# Patient Record
Sex: Male | Born: 1935
Health system: Southern US, Community
[De-identification: ages and names within clinical notes are randomized; demographics above are authoritative.]

## PROBLEM LIST (undated history)

## (undated) DIAGNOSIS — C44211 Basal cell carcinoma of skin of unspecified ear and external auricular canal: Secondary | ICD-10-CM

## (undated) DIAGNOSIS — S82209A Unspecified fracture of shaft of unspecified tibia, initial encounter for closed fracture: Secondary | ICD-10-CM

## (undated) DIAGNOSIS — C060 Malignant neoplasm of cheek mucosa: Secondary | ICD-10-CM

## (undated) DIAGNOSIS — S5291XA Unspecified fracture of right forearm, initial encounter for closed fracture: Secondary | ICD-10-CM

## (undated) DIAGNOSIS — Z87891 Personal history of nicotine dependence: Secondary | ICD-10-CM

## (undated) DIAGNOSIS — S62309A Unspecified fracture of unspecified metacarpal bone, initial encounter for closed fracture: Secondary | ICD-10-CM

## (undated) DIAGNOSIS — M1990 Unspecified osteoarthritis, unspecified site: Secondary | ICD-10-CM

## (undated) HISTORY — DX: Basal cell carcinoma of skin of unspecified ear and external auricular canal: C44.211

## (undated) HISTORY — DX: Unspecified fracture of unspecified metacarpal bone, initial encounter for closed fracture: S62.309A

## (undated) HISTORY — PX: CATARACT EXTRACTION: SUR2

## (undated) HISTORY — PX: PARTIAL GLOSSECTOMY: SHX2173

## (undated) HISTORY — PX: INGUINAL HERNIA REPAIR: SUR1180

## (undated) HISTORY — DX: Malignant neoplasm of cheek mucosa: C06.0

## (undated) HISTORY — DX: Unspecified fracture of shaft of unspecified tibia, initial encounter for closed fracture: S82.209A

## (undated) HISTORY — DX: Personal history of nicotine dependence: Z87.891

## (undated) HISTORY — DX: Unspecified fracture of right forearm, initial encounter for closed fracture: S52.91XA

---
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
## 1962-10-12 DIAGNOSIS — S82209A Unspecified fracture of shaft of unspecified tibia, initial encounter for closed fracture: Secondary | ICD-10-CM

## 1962-10-12 HISTORY — DX: Unspecified fracture of shaft of unspecified tibia, initial encounter for closed fracture: S82.209A

## 1992-10-12 HISTORY — PX: INGUINAL HERNIA REPAIR: SUR1180

## 1995-10-13 HISTORY — PX: APPENDECTOMY: SHX54

## 2001-07-25 ENCOUNTER — Encounter: Admission: RE | Admit: 2001-07-25 | Discharge: 2001-07-25 | Payer: Self-pay | Admitting: Urology

## 2001-07-25 ENCOUNTER — Encounter: Payer: Self-pay | Admitting: Urology

## 2001-09-14 ENCOUNTER — Encounter: Admission: RE | Admit: 2001-09-14 | Discharge: 2001-09-14 | Payer: Self-pay | Admitting: Urology

## 2001-09-14 ENCOUNTER — Encounter: Payer: Self-pay | Admitting: Urology

## 2001-09-23 ENCOUNTER — Encounter: Payer: Self-pay | Admitting: Urology

## 2001-09-23 ENCOUNTER — Encounter: Admission: RE | Admit: 2001-09-23 | Discharge: 2001-09-23 | Payer: Self-pay | Admitting: Urology

## 2001-10-18 ENCOUNTER — Encounter: Payer: Self-pay | Admitting: Urology

## 2001-10-18 ENCOUNTER — Encounter: Admission: RE | Admit: 2001-10-18 | Discharge: 2001-10-18 | Payer: Self-pay | Admitting: Urology

## 2001-11-03 ENCOUNTER — Encounter (INDEPENDENT_AMBULATORY_CARE_PROVIDER_SITE_OTHER): Payer: Self-pay | Admitting: Specialist

## 2001-11-03 ENCOUNTER — Ambulatory Visit (HOSPITAL_BASED_OUTPATIENT_CLINIC_OR_DEPARTMENT_OTHER): Admission: RE | Admit: 2001-11-03 | Discharge: 2001-11-03 | Payer: Self-pay | Admitting: Urology

## 2001-11-11 ENCOUNTER — Emergency Department (HOSPITAL_COMMUNITY): Admission: EM | Admit: 2001-11-11 | Discharge: 2001-11-11 | Payer: Self-pay | Admitting: Emergency Medicine

## 2002-09-02 ENCOUNTER — Inpatient Hospital Stay (HOSPITAL_COMMUNITY): Admission: EM | Admit: 2002-09-02 | Discharge: 2002-09-03 | Payer: Self-pay | Admitting: Emergency Medicine

## 2002-09-02 ENCOUNTER — Encounter (INDEPENDENT_AMBULATORY_CARE_PROVIDER_SITE_OTHER): Payer: Self-pay | Admitting: Specialist

## 2002-10-12 DIAGNOSIS — C060 Malignant neoplasm of cheek mucosa: Secondary | ICD-10-CM

## 2002-10-12 HISTORY — DX: Malignant neoplasm of cheek mucosa: C06.0

## 2004-05-02 ENCOUNTER — Encounter (INDEPENDENT_AMBULATORY_CARE_PROVIDER_SITE_OTHER): Payer: Self-pay | Admitting: Specialist

## 2004-05-02 ENCOUNTER — Inpatient Hospital Stay (HOSPITAL_COMMUNITY): Admission: RE | Admit: 2004-05-02 | Discharge: 2004-05-04 | Payer: Self-pay | Admitting: Otolaryngology

## 2004-11-06 ENCOUNTER — Encounter: Admission: RE | Admit: 2004-11-06 | Discharge: 2004-11-06 | Payer: Self-pay | Admitting: Otolaryngology

## 2005-11-19 ENCOUNTER — Emergency Department (HOSPITAL_COMMUNITY): Admission: EM | Admit: 2005-11-19 | Discharge: 2005-11-19 | Payer: Self-pay | Admitting: Emergency Medicine

## 2006-03-29 ENCOUNTER — Emergency Department (HOSPITAL_COMMUNITY): Admission: EM | Admit: 2006-03-29 | Discharge: 2006-03-30 | Payer: Self-pay | Admitting: Emergency Medicine

## 2006-11-25 ENCOUNTER — Encounter: Admission: RE | Admit: 2006-11-25 | Discharge: 2006-11-25 | Payer: Self-pay | Admitting: Otolaryngology

## 2007-10-17 ENCOUNTER — Encounter: Admission: RE | Admit: 2007-10-17 | Discharge: 2007-10-17 | Payer: Self-pay | Admitting: Otolaryngology

## 2007-11-15 ENCOUNTER — Encounter: Admission: RE | Admit: 2007-11-15 | Discharge: 2007-11-15 | Payer: Self-pay | Admitting: Otolaryngology

## 2008-11-30 ENCOUNTER — Encounter: Admission: RE | Admit: 2008-11-30 | Discharge: 2008-11-30 | Payer: Self-pay | Admitting: Otolaryngology

## 2010-05-16 HISTORY — PX: CHOLECYSTECTOMY: SHX55

## 2011-02-07 ENCOUNTER — Emergency Department (HOSPITAL_COMMUNITY)
Admission: EM | Admit: 2011-02-07 | Discharge: 2011-02-08 | Disposition: A | Discharge status: Home / Self Care | Payer: Medicare Other | Attending: Emergency Medicine | Admitting: Emergency Medicine

## 2011-02-07 DIAGNOSIS — R110 Nausea: Secondary | ICD-10-CM | POA: Insufficient documentation

## 2011-02-07 DIAGNOSIS — IMO0002 Reserved for concepts with insufficient information to code with codable children: Secondary | ICD-10-CM | POA: Insufficient documentation

## 2011-02-07 LAB — BASIC METABOLIC PANEL
CO2: 27 mEq/L (ref 19–32)
Chloride: 104 mEq/L (ref 96–112)
Creatinine, Ser: 1.04 mg/dL (ref 0.4–1.5)
GFR calc non Af Amer: >60 mL/min (ref >60)
Sodium: 139 mEq/L (ref 135–145)

## 2011-02-07 LAB — DIFFERENTIAL
Basophils Absolute: 0 10*3/uL (ref 0.0–0.1)
Basophils Relative: 0 % (ref 0–1)
Eosinophils Absolute: 0.2 10*3/uL (ref 0.0–0.7)
Eosinophils Relative: 2 % (ref 0–5)
Lymphs Abs: 1.7 10*3/uL (ref 0.7–4.0)
Neutrophils Relative %: 67 % (ref 43–77)

## 2011-02-07 LAB — CBC
Hemoglobin: 13.9 g/dL (ref 13.0–17.0)
MCV: 87.9 fL (ref 78.0–100.0)
RDW: 12.5 % (ref 11.5–15.5)
WBC: 7.8 10*3/uL (ref 4.0–10.5)

## 2011-02-27 NOTE — Op Note (Signed)
NAME:  Justin Mcgee, Justin Mcgee NO.:  [id number]   MEDICAL RECORD NO.:  [id number]                   PATIENT TYPE:  INP   LOCATION:  0101                                 FACILITY:  [facility]   PHYSICIAN:  Lorre Munroe., M.D.            DATE OF BIRTH:  1935/08/09   DATE OF PROCEDURE:  09/02/2002  DATE OF DISCHARGE:                                 OPERATIVE REPORT   PREOPERATIVE DIAGNOSES:  Acute appendicitis.   POSTOPERATIVE DIAGNOSIS:  Acute appendicitis.   OPERATION:  Laparoscopic appendectomy.   SURGEON:  Lebron Conners, M.D.   ANESTHESIA:  General.   PROCEDURE:  After the patient was monitored and anesthetized and had routine  preparation and draping of the abdomen, in the anesthetized area just below  the umbilicus, we made a short transverse incision there.  Incised the  fascia longitudinally in the midline and entered the peritoneum bluntly.  I  put in my finger to assure that there were no adhesions in the area.  I put  a pursestring 0 Vicryl suture in the fascia and secured a Hasson cannula and  then inflated the abdomen with CO2.  I put in the scope and saw that there  was a little bit of cloudy fluid in the right lower quadrant, and after  moving the bowel a bit, we were able to determine that the patient had acute  appendicitis.  I then anesthetized the port sites in the lower midline in  the right upper quadrant and put in a 5 mm right upper quadrant port and a 2  mm lower midline port.  Then, I dissected the fat away and mesoappendix  after elevating the appendix with a large grasper.  I clipped the  appendiceal artery with three clips and divided between the two most distal  and then stapled the appendix with linear cutting endoscopic stapler just  distal to its emergence from the cecum.  It appeared healthy at that point.  I then placed the appendix in a plastic pouch and removed it through the  umbilical incision and tied the pursestring  suture in the umbilical  incision.  I carefully inspected the area for hemostasis and found it was  good.  I saw no other intra-abdominal abnormalities.  I then removed the  right upper quadrant port under direct vision, and there was no bleeding  from the abdominal wall.  I removed the lower midline port after allowing  the CO2 to escape.  I closed all skin incisions with intracuticular 4-0  Vicryl and Steri-Strips.  The patient was stable through the procedure.                                               Lorre Munroe., M.D.    WB/MEDQ  D:  09/02/2002  T:  09/03/2002  Job:  [id number]   cc:   Brett Canales A. Cleta Alberts, M.D.  [address]  [place]  [place] [address]  Fax: [phone]

## 2011-02-27 NOTE — Op Note (Signed)
[facility]  Patient:    Justin Mcgee, Justin Mcgee Visit Number: [id number] MRN: [id number]          Service Type: NES Location: NESC Attending Physician:  Liborio Nixon Dictated by:   Bertram Millard. Dahlstedt, M.D. Proc. Date: 11/03/01 Admit Date:  11/03/2001   CC:         Jamesetta Geralds, M.D.   Operative Report  PREOPERATIVE DIAGNOSIS:  Left inguinal adenopathy.  POSTOPERATIVE DIAGNOSIS:  Left inguinal adenopathy.  PROCEDURE:  Left inguinal lymph node dissection.  SURGEON:  Bertram Millard. Dahlstedt, M.D.  ANESTHESIA:  General with LMA.  COMPLICATIONS:  None.  BRIEF HISTORY:  This 76 year old gentleman has presented to my attention in the past with hematospermia.  This has been treated both conservatively and aggressively with Proscar.  He had persistent hematospermia despite aggressive treatment and requested further evaluation.  CT scan of the abdomen and pelvis was done, which revealed normal intra-abdominal findings but left groin adenopathy.  This was followed up three months later with a repeat CT scan, which showed possible increased size of his lymph nodes in the left inguinal region.  I was unable to palpate these, but the patient is mildly overweight. I spoke with a medical oncologist, who recommended CT of the chest.  This was performed and showed no adenopathy.  At this point it was recommended to undergo lymph node dissection in his left groin.  Risks and complications have been discussed with this patient, who desires to proceed.  DESCRIPTION OF PROCEDURE:  The patient was administered a general anesthetic using the LMA.  He was placed into the supine position.  Genitalia and perineum were prepped and draped.  His groin was carefully palpated without obvious lymphadenopathy.  An incision was made in a vertical direction just under his left inguinal ligament.  The saphenous vein was encountered and was quite large secondary to  varicosities.  Dissection was performed on either side of the saphenous vein in the lymphatic tissue.  Careful dissection was performed, and small vessels and lymphatics were clipped with ligaclips.  Two packets of tissue on both sides of the saphenous vein were then carefully dissected out and sent for permanent section fresh.  There was no obvious adenopathy on careful dissection within the inguinal region in an area corresponding to the CT scan.  It was felt that perhaps the appearance of adenopathy was due to varicosities.  Following removal of both lymphatic packets, the beds of dissection were carefully inspected and found to be hemostatic.  At this point the incision was infiltrated with 10 cc of 0.5% plain Marcaine.  Subcutaneous sutures were taken using 2-0 Vicryl.  Skin edges were reapproximated using running subcuticular suture of 4-0 Vicryl. Steri-Strips, Telfa, and a Tegaderm dressing were placed.  The patient tolerated the procedure well.  Estimated blood loss was perhaps 25 cc.  He was awakened, extubated, and taken to the PACU in stable condition.  He was discharged home on Vicodin tablets, #15, one p.o. q.4h.  He will follow up in two weeks.  He was given activity restrictions. Dictated by:   Bertram Millard. Dahlstedt, M.D. Attending Physician:  Liborio Nixon DD:  11/03/01 TD:  11/04/01 Job: [id number]

## 2011-02-27 NOTE — Op Note (Signed)
NAME:  Justin Mcgee, Justin Mcgee                          ACCOUNT NO.:  [id number]   MEDICAL RECORD NO.:  [id number]                   PATIENT TYPE:  INP   LOCATION:  5713                                 FACILITY:  [facility]   PHYSICIAN:  Suzanna Obey, M.D.                    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   POSTOPERATIVE DIAGNOSIS:  Left squamous cell carcinoma of the tongue.   SURGICAL PROCEDURE:  Direct laryngoscopy, left hemiglossectomy, left  modified radical neck dissection.   ANESTHESIA:  General endotracheal tube.   ESTIMATED BLOOD LOSS:  approximately 50 cubic centimeters.   INDICATIONS:  This is a 76 year old who has had biopsy-proven squamous cell  carcinoma of the left tongue.  He had an irritated area that was present for  several months.  He has no palpable disease in his neck.  A long discussion  ensued about excision of his tongue lesion, and he wanted to proceed.  A  discussion about the neck, he was told that there is probably 20% chance  that the lymph nodes in his neck could be involved with tumor.  He was given  the option of observing, CT scan, PET scan, or surgery.  He elected to  proceed with removing the lymph nodes because he would prefer to know if  they are involved.  He was informed of the risks and benefits of the  procedure including bleeding, infection, scarring, dysphagia, difficulty  speaking, weakness of the shoulder, weakness and numbness of the tongue,  scar, and risk of the anesthetic.  All questions were answered and consent  was obtained.   OPERATION:  The patient was taken to the operating room and placed in the  supine position.  After adequate general endotracheal tube anesthesia, he  was examined with a direct laryngoscopy scope which did not reveal any  lesions other than the  left-sided lesion on his tongue which had a central  ulcer from possibly previous biopsy or from the tumor.  The tonsils,  epiglottis, piriformis, all looked good with no lesions.  Vocal cords looked  normal.  The palpation of the base of the tongue did not reveal any evidence  of tumor, and the mass in his left tongue did seem to be palpably very  superficial.  The patient was then prepped and draped in the usual sterile  manner.  Beginning with the tongue, an incision was made around the tongue  with the electrocautery with what appeared to be good margins.  This was  dissected; and, from anterior to posterior, electrocautery dissection was  performed removing a large piece of the tongue and dissecting down into the  muscle.  This specimen was then marked and sent for frozen which  had clear  margins and clear deep margin.  The tongue was then irrigated and hemostasis  achieved.  It was closed with interrupted 3-0 Vicryl.  The neck dissection  was then performed making an apron-type incision and dissecting through  subplatysmal flaps superiorly and inferiorly.  The marginal mandibular nerve  was carefully identified, dissected free, and pushed up superiorly to  preserve it.  The dissection was carried along the submandibular triangle  where lymph nodes were removed, brought the vessels down.  The gland was  removed.  The dissection was carried out throughout the submental and  underneath the mylohyoid muscle.  The lingual nerve was identified and  preserved.  The hypoglossal nerve was identified and preserved.  The  dissection was carried off the sternocleidomastoid muscle, dissected down to  this cervical plexus where the fascia group was brought up over the vagus,  carotid, and jugular all preserved.  This fascia group was then removed.  The spinal accessory nerve had been identified, carefully dissected up to  the digastric, and the superior aspect of the fascia was dissected, brought   underneath the nerve, and then brought with the specimen off the jugular,  and the specimen was marked.  It was sent for permanent section.  The wound  was irrigated with saline.  There was good hemostasis.  A #10 JP drain was  placed and secured with a 3-0 silk.  The subplatysmal plane was closed with  interrupted 4-0 chromic and skin staples placed in the skin.  The patient  was awakened and brought to the recovery room in stable condition, counts  correct.                                               Suzanna Obey, M.D.    [name]  D:  05/02/2004  T:  05/03/2004  Job:  [id number]

## 2011-10-20 DIAGNOSIS — B009 Herpesviral infection, unspecified: Secondary | ICD-10-CM | POA: Diagnosis not present

## 2011-10-20 DIAGNOSIS — D485 Neoplasm of uncertain behavior of skin: Secondary | ICD-10-CM | POA: Diagnosis not present

## 2011-11-22 DIAGNOSIS — H9209 Otalgia, unspecified ear: Secondary | ICD-10-CM | POA: Diagnosis not present

## 2012-04-15 DIAGNOSIS — H40019 Open angle with borderline findings, low risk, unspecified eye: Secondary | ICD-10-CM | POA: Diagnosis not present

## 2012-04-15 DIAGNOSIS — Z961 Presence of intraocular lens: Secondary | ICD-10-CM | POA: Diagnosis not present

## 2012-05-05 DIAGNOSIS — L738 Other specified follicular disorders: Secondary | ICD-10-CM | POA: Diagnosis not present

## 2012-05-24 DIAGNOSIS — B354 Tinea corporis: Secondary | ICD-10-CM | POA: Diagnosis not present

## 2012-05-24 DIAGNOSIS — R21 Rash and other nonspecific skin eruption: Secondary | ICD-10-CM | POA: Diagnosis not present

## 2012-09-30 DIAGNOSIS — L03119 Cellulitis of unspecified part of limb: Secondary | ICD-10-CM | POA: Diagnosis not present

## 2012-09-30 DIAGNOSIS — L02419 Cutaneous abscess of limb, unspecified: Secondary | ICD-10-CM | POA: Diagnosis not present

## 2012-10-03 DIAGNOSIS — L0390 Cellulitis, unspecified: Secondary | ICD-10-CM | POA: Diagnosis not present

## 2012-10-03 DIAGNOSIS — R0982 Postnasal drip: Secondary | ICD-10-CM | POA: Diagnosis not present

## 2012-10-03 DIAGNOSIS — R05 Cough: Secondary | ICD-10-CM | POA: Diagnosis not present

## 2012-12-19 DIAGNOSIS — H40019 Open angle with borderline findings, low risk, unspecified eye: Secondary | ICD-10-CM | POA: Diagnosis not present

## 2013-01-03 DIAGNOSIS — H2510 Age-related nuclear cataract, unspecified eye: Secondary | ICD-10-CM | POA: Diagnosis not present

## 2013-01-19 DIAGNOSIS — H2589 Other age-related cataract: Secondary | ICD-10-CM | POA: Diagnosis not present

## 2013-01-19 DIAGNOSIS — H2510 Age-related nuclear cataract, unspecified eye: Secondary | ICD-10-CM | POA: Diagnosis not present

## 2013-03-07 DIAGNOSIS — M25569 Pain in unspecified knee: Secondary | ICD-10-CM | POA: Diagnosis not present

## 2013-03-07 DIAGNOSIS — R937 Abnormal findings on diagnostic imaging of other parts of musculoskeletal system: Secondary | ICD-10-CM | POA: Diagnosis not present

## 2013-03-22 ENCOUNTER — Encounter: Payer: Self-pay | Admitting: Family Medicine

## 2013-03-22 ENCOUNTER — Ambulatory Visit (INDEPENDENT_AMBULATORY_CARE_PROVIDER_SITE_OTHER): Payer: Medicare Other | Admitting: Family Medicine

## 2013-03-22 VITALS — BP 124/68 | HR 70 | Wt 268.0 lb

## 2013-03-22 DIAGNOSIS — K219 Gastro-esophageal reflux disease without esophagitis: Secondary | ICD-10-CM

## 2013-03-22 DIAGNOSIS — R21 Rash and other nonspecific skin eruption: Secondary | ICD-10-CM | POA: Diagnosis not present

## 2013-03-22 DIAGNOSIS — Z13220 Encounter for screening for lipoid disorders: Secondary | ICD-10-CM | POA: Diagnosis not present

## 2013-03-22 DIAGNOSIS — D229 Melanocytic nevi, unspecified: Secondary | ICD-10-CM

## 2013-03-22 DIAGNOSIS — C060 Malignant neoplasm of cheek mucosa: Secondary | ICD-10-CM

## 2013-03-22 DIAGNOSIS — Z87891 Personal history of nicotine dependence: Secondary | ICD-10-CM | POA: Diagnosis not present

## 2013-03-22 DIAGNOSIS — L820 Inflamed seborrheic keratosis: Secondary | ICD-10-CM | POA: Diagnosis not present

## 2013-03-22 DIAGNOSIS — D2320 Other benign neoplasm of skin of unspecified ear and external auricular canal: Secondary | ICD-10-CM | POA: Diagnosis not present

## 2013-03-22 DIAGNOSIS — Z85828 Personal history of other malignant neoplasm of skin: Secondary | ICD-10-CM | POA: Diagnosis not present

## 2013-03-22 DIAGNOSIS — N400 Enlarged prostate without lower urinary tract symptoms: Secondary | ICD-10-CM | POA: Insufficient documentation

## 2013-03-22 LAB — COMPLETE METABOLIC PANEL WITH GFR
ALT: 16 U/L (ref 0–53)
Albumin: 4.7 g/dL (ref 3.5–5.2)
Alkaline Phosphatase: 65 U/L (ref 39–117)
BUN: 18 mg/dL (ref 6–23)
Calcium: 9.4 mg/dL (ref 8.4–10.5)
Creat: 1.15 mg/dL (ref 0.50–1.35)
Potassium: 4.5 mEq/L (ref 3.5–5.3)
Sodium: 143 mEq/L (ref 135–145)
Total Bilirubin: 0.4 mg/dL (ref 0.3–1.2)
Total Protein: 7 g/dL (ref 6.0–8.3)

## 2013-03-22 LAB — CBC WITH DIFFERENTIAL/PLATELET
Lymphs Abs: 2.1 10*3/uL (ref 0.7–4.0)
MCH: 30.4 pg (ref 26.0–34.0)
MCHC: 35.2 g/dL (ref 30.0–36.0)
MCV: 86.5 fL (ref 78.0–100.0)
Neutrophils Relative %: 53 % (ref 43–77)
Platelets: 154 10*3/uL (ref 150–400)
RBC: 4.73 MIL/uL (ref 4.22–5.81)

## 2013-03-22 NOTE — Assessment & Plan Note (Signed)
Recommended a trial of Nexium since it is now counter now. If he has similar side effects to that but the Prilosec then recommend he switch to an H2 blocker such as Zantac. Also make sure following reflux hygiene.

## 2013-03-22 NOTE — Progress Notes (Signed)
Subjective:    Patient ID: Justin Mcgee, male    DOB: 1935-10-01, 78 y.o.   MRN: [id number]  HPI  Here to establish care today. He is the husband of a patient of mine.  Rash - on back x 5 months on and off. Says will start as a blister and then will get itchey and then will get more papular lesion that can take months to weeks ot improve.  Given ketoconazole to use and says that he does feel it helps some.He has been using some over-the-counter hydrocortisone cream as well and says he thinks this is helpful to but doesn't completely resolve the lesion.[name] no changes in soap, lotions, detergents. They do itch occasionally. He said previously had several of them on his back. Today he just has one on his right side.  Has a separate rash on the right outer lower leg near the ankle x 8 months.  Not itchey. Not changing size.  No hx of psoriasis or eczema. He says he was told in the past to make sure he avoids hot showers and uses Serevent. He was told this by dermatologist..    Also has a mole on his right upper arm near his elbow that has been there for several years. He says it's been getting larger lately. No bleeding or pain. He does have a history of basal cell skin cancer as well as squamous cell skin cancer.  He has not seen a primary care physician him is 3-4 years. That was about the last time he had blood work. He does not take any prescription medications. He does report seeing a neurologist for a pinched nerve in his low back. He is also been seen for bilateral knee pain and has been told that he has some osteoarthritis. Having problems with his knees over the last year.  Review of Systems  Constitutional: Negative for fever, diaphoresis and unexpected weight change.  HENT: Positive for hearing loss. Negative for rhinorrhea, sneezing and tinnitus.   Eyes: Negative for visual disturbance.  Respiratory: Positive for cough. Negative for wheezing.   Cardiovascular: Negative for chest pain and  palpitations.  Gastrointestinal: Negative for nausea, vomiting, diarrhea and blood in stool.  Genitourinary: Negative for dysuria and discharge.  Musculoskeletal: Positive for myalgias. Negative for arthralgias.  Skin: Positive for rash.  Neurological: Negative for headaches.  Hematological: Negative for adenopathy.  Psychiatric/Behavioral: Negative for sleep disturbance and dysphoric mood. The patient is not nervous/anxious.    BP 124/68  Pulse 70  Wt 268 lb (121.564 kg)    Allergies  Allergen Reactions  . Other Other (See Comments)    Topical sulfa only. Gets skin irritation.    Past Medical History  Diagnosis Date  . Squamous cell cancer of buccal mucosa 2004    Dr. Derryl Harbor, no radiation or chemo, clear LN.   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 Fracture of right radius 1956  . Fracture, metacarpal 1956    1, 2, 3  . Fracture, tibia 1964  . Former smoker     Past Surgical History  Procedure Laterality Date  . Cholecystectomy    . Appendectomy  1997  . Inguinal hernia repair Right 1965, 1968  . Inguinal hernia repair Left 1994    History   Social History  . Marital Status: Married    Spouse Name: Vitaly Wanat     Number of Children: 2  . Years of Education:  N/A   Occupational History  . [employer] manager [employer]   Social History Main Topics  . Smoking status: Former Smoker    Quit date: 02/19/1978  . Smokeless tobacco: Not on file  . Alcohol Use: No  . Drug Use: No  . Sexually Active: Yes -- Male partner(s)   Other Topics Concern  . Not on file   Social History Narrative   4 caffeine drink per day.  40 min cardio/strength 6 days per week          Family History  Problem Relation Age of Onset  . GER disease Father   . Other Father     [facility]    Outpatient Encounter Prescriptions as of 03/22/2013  Medication Sig Dispense Refill  . Cholecalciferol (VITAMIN D3) 2000 UNITS TABS Take 1 tablet by mouth  daily.      . Cyanocobalamin (B-12) 1000 MCG CAPS Take 1 tablet by mouth daily.      . Multiple Vitamin (MULTIVITAMIN) capsule Take 1 capsule by mouth daily.      . Saw Palmetto 450 MG CAPS Take 1 capsule by mouth daily.       No facility-administered encounter medications on file as of 03/22/2013.          Objective:   Physical Exam  Constitutional: He is oriented to person, place, and time. He appears well-developed and well-nourished.  HENT:  Head: Normocephalic and atraumatic.  Cardiovascular: Normal rate, regular rhythm and normal heart sounds.   Pulmonary/Chest: Effort normal and breath sounds normal.  Neurological: He is alert and oriented to person, place, and time.  Skin: Skin is warm and dry.  He has a 1 x 0.5 cm tan oval papule with some white scale on the right flank. On the right lower leg just above the lateral malleolus he has a 4 x 6 cm scaling plaque. With an erythematous base. Well-demarcated with the scalpel along the edge. No central clearing. He has a very similar lesion a little smaller on the left lower leg above the medial malleolus.  On the right upper arm just above the elbow he has a approximately 1 cm dark brown wartlike mole. Borders are smooth.   Psychiatric: He has a normal mood and affect. His behavior is normal.          Assessment & Plan:  Rash-I believe he has 2 separate rashes. I did discus tx options of the lesion on his right flank as well as the area on his right lower leg. The one on the right lower leg looks more consistent with a mild type of psoriasis or eczema. Will call once the KOH scraping report is back. Will most likely try treating with a topical steroid cream area that he did get some improvement with topical ketoconazole. The lesion on his right side looks more like a separate keratosis but he says that they typically start as vesicles and then become more papular which is not consistent with a seborrheic keratosis.  Atypical nevus-it  has a wartlike appearance. Certainly it could be a wart or a seborrheic keratosis but with change in size in his prior history of basal cell and squamous cell skin cancers I recommend removal.  Screening lipids-he has not had a screening lipid panel and well over 3 years. We will obtain that today.  Colon cancer screening. He does report that he has had up-to-date screening probably about 4 or 5 years ago. Will call to get his records and see  what he has had and what he needs. He also reports that his pneumonia vaccine is up-to-date. Says he got up at [facility] a couple years ago. He says he has not had the shingles vaccine.

## 2013-03-23 ENCOUNTER — Other Ambulatory Visit: Payer: Self-pay | Admitting: Family Medicine

## 2013-03-23 LAB — KOH PREP
RESULT - KOH: NONE SEEN
RESULT - KOH: NONE SEEN

## 2013-03-23 MED ORDER — BETAMETHASONE DIPROPIONATE 0.05 % EX CREA: TOPICAL_CREAM | Freq: Every day | CUTANEOUS | Status: DC

## 2013-03-27 ENCOUNTER — Encounter: Payer: Self-pay | Admitting: Physician Assistant

## 2013-03-27 ENCOUNTER — Ambulatory Visit (INDEPENDENT_AMBULATORY_CARE_PROVIDER_SITE_OTHER): Payer: Medicare Other | Admitting: Physician Assistant

## 2013-03-27 VITALS — BP 117/67 | HR 61 | Temp 98.0°F | Wt 208.0 lb

## 2013-03-27 DIAGNOSIS — L089 Local infection of the skin and subcutaneous tissue, unspecified: Secondary | ICD-10-CM

## 2013-03-27 MED ORDER — CEPHALEXIN 500 MG PO CAPS: 500.0000 mg | ORAL_CAPSULE | Freq: Two times a day (BID) | ORAL | Status: DC

## 2013-03-27 NOTE — Progress Notes (Signed)
  Subjective:    Patient ID: Justin Mcgee, male    DOB: [birth year], 78 y.o.   MRN: [id number]  HPI Patient presents to the clinic after a shave biopsy on 6/11 with lesion oozing yellow fluid and continuing to get red around biopsy location. Per patient the first couple of days was fine and then the redness started. Yesterday it started a slow ooze of yellow discharge. Denies any fever, chills, palpitations, sweats, pain around site. Has been using Vaseline and keeping covered 24 hours a day.     Review of Systems     Objective:   Physical Exam  Constitutional: He appears well-developed and well-nourished.  HENT:  Head: Normocephalic and atraumatic.  Skin:  Right upper arm biopsy site with redness extending 6mm around circumference and yellow discharge oozing from site. Warm to touch.   Psychiatric: He has a normal mood and affect. His behavior is normal.          Assessment & Plan:  Local skin infection- I did treat for 10 days with keflex. In office saline wash was done and I put bactroban on area and placed in a bandage.Pt was given bactroban to take home and told to use 3 times a day for 3 days.  I discussed with patient air time can be very healing and to make sure he was not keeping covered continually. Continue to wash with soap and water. Call if not improving.

## 2013-04-06 ENCOUNTER — Encounter: Payer: Self-pay | Admitting: Family Medicine

## 2013-04-19 ENCOUNTER — Encounter: Payer: Self-pay | Admitting: Family Medicine

## 2013-04-19 ENCOUNTER — Ambulatory Visit (INDEPENDENT_AMBULATORY_CARE_PROVIDER_SITE_OTHER): Payer: Medicare Other | Admitting: Family Medicine

## 2013-04-19 VITALS — BP 125/68 | HR 71 | Ht 75.0 in | Wt 208.0 lb

## 2013-04-19 DIAGNOSIS — R7301 Impaired fasting glucose: Secondary | ICD-10-CM | POA: Insufficient documentation

## 2013-04-19 DIAGNOSIS — R21 Rash and other nonspecific skin eruption: Secondary | ICD-10-CM

## 2013-04-19 DIAGNOSIS — Z5189 Encounter for other specified aftercare: Secondary | ICD-10-CM

## 2013-04-19 DIAGNOSIS — Z23 Encounter for immunization: Secondary | ICD-10-CM

## 2013-04-19 LAB — POCT GLYCOSYLATED HEMOGLOBIN (HGB A1C): Hemoglobin A1C: 5.8

## 2013-04-19 MED ORDER — AMBULATORY NON FORMULARY MEDICATION: Status: DC

## 2013-04-19 NOTE — Progress Notes (Signed)
  Subjective:    Patient ID: Justin Mcgee, male    DOB: 09-Sep-1935, 77 y.o.   MRN: [id number]  HPI Think has had sinus infection x 2 weeks. Yellow nasal congestion. Mild cough.  He is 90% better. Wet cough.  Initally ST but that has resolved.  No sick contracts  Rash on his leg has resolved with the topical steroid.   Lesion  On right arm has healed well. No more sign of infection  Abnormal glucose-he did have an elevated glucose on labs in June. He is never been diagnosed with diabetes. Increased thirst urination.   Review of Systems     Objective:   Physical Exam  Constitutional: He is oriented to person, place, and time. He appears well-developed and well-nourished.  HENT:  Head: Normocephalic and atraumatic.  Cardiovascular: Normal rate, regular rhythm and normal heart sounds.   Pulmonary/Chest: Effort normal and breath sounds normal.  Neurological: He is alert and oriented to person, place, and time.  Skin: Skin is warm and dry.  Lesion near right elbow is healing well. There is an approximately half centimeter scab still in place but no sign of erythema or irritation or drainage or edema.  Psychiatric: He has a normal mood and affect. His behavior is normal.          Assessment & Plan:  IFG - new diagnosis. Will recheck in 6 months. Reviewed his dX and needed dietary changes.  Encourage regular exercise. We'll need to repeat hemoglobin A1c every 6 months to follow this.  Acute sinusitis-resolving. Call if suddenly gets worse but otherwise I think he is getting over this on his own and doing well.  Rash on lower legs-resolved. Encouraged him to keep the topical steroid on hand in case it returns.  Biopsy site status post wound infection after shave biopsy-wound is healing well.

## 2013-08-15 DIAGNOSIS — H40019 Open angle with borderline findings, low risk, unspecified eye: Secondary | ICD-10-CM | POA: Diagnosis not present

## 2013-08-25 ENCOUNTER — Ambulatory Visit (INDEPENDENT_AMBULATORY_CARE_PROVIDER_SITE_OTHER): Payer: Medicare Other | Admitting: Family Medicine

## 2013-08-25 ENCOUNTER — Encounter: Payer: Self-pay | Admitting: Family Medicine

## 2013-08-25 VITALS — BP 132/70 | HR 85 | Temp 98.2°F | Wt 195.0 lb

## 2013-08-25 DIAGNOSIS — R112 Nausea with vomiting, unspecified: Secondary | ICD-10-CM

## 2013-08-25 DIAGNOSIS — R197 Diarrhea, unspecified: Secondary | ICD-10-CM

## 2013-08-25 MED ORDER — PROMETHAZINE HCL 25 MG/ML IJ SOLN
25.0000 mg | Freq: Once | INTRAMUSCULAR | Status: AC
  Administered 2013-08-25: 25 mg via INTRAMUSCULAR

## 2013-08-25 MED ORDER — PROMETHAZINE HCL 25 MG PO TABS: 25.0000 mg | ORAL_TABLET | Freq: Four times a day (QID) | ORAL | Status: DC | PRN

## 2013-08-25 MED ORDER — PROMETHAZINE HCL 25 MG RE SUPP: 25.0000 mg | Freq: Four times a day (QID) | RECTAL | Status: DC | PRN

## 2013-08-25 NOTE — Addendum Note (Signed)
Addended by: Deno Etienne on: 08/25/2013 11:52 AM   Modules accepted: Orders

## 2013-08-25 NOTE — Progress Notes (Signed)
  Subjective:    Patient ID: Justin Mcgee, male    DOB: [birth year], 78 y.o.   MRN: [id number]  HPI Diarrhea started 3 days ago after eating chik-fila-a.  No blood in the stool.  . Vomiting started yesterday AM. Tried Emitrol and couldn't keep the medication down.  Has had fever 101.5 in the afternoons starting Wed.  Today was 98.2.  No known sick ocontacts. Last episode of diarrhea was 10 this AM.  Was on ABX for skin wound about 3 months ago.  No URI sxs.  Keeps occ stomach cramping but is diffuse. Not localized to one area. No dysuria. No blood in the urine or stool.   Review of Systems     Objective:   Physical Exam  Constitutional: He is oriented to person, place, and time. He appears well-developed and well-nourished.  HENT:  Head: Normocephalic and atraumatic.  Cardiovascular: Normal rate, regular rhythm and normal heart sounds.   Pulmonary/Chest: Effort normal and breath sounds normal.  Abdominal: Soft. Bowel sounds are normal. He exhibits no distension and no mass. There is no tenderness. There is no rebound and no guarding.  Neurological: He is alert and oriented to person, place, and time.  Skin: Skin is warm and dry.  Psychiatric: He has a normal mood and affect. His behavior is normal.          Assessment & Plan:  N/V/D - viral enteritis versus food poisoning versus other causes such as hepatitis, renal failure etc. Will give him a shot of Phenergan. We'll also given prescription to go home. If he can keep his in his system and try to keep some water down then we can try to gently rehydrate him orally over the next day or 2 and hopefully he should go better. Check a CBC just to evaluate for significantly elevated white count. Will check liver enzymes to evaluate for hepatitis and will check creatinine. Suspect it may be up a little bit from some mild dehydration but for no sign of renal failure. We'll see any other signs of this on exam today. In the first he said he did not  have any known sick contacts but in the conversation later his wife mention that his daughter had a similar illness about 2 weeks ago that required an ED visit for IV hydration because she had been sick for about 3 days.

## 2013-08-25 NOTE — Patient Instructions (Signed)
Viral Gastroenteritis Viral gastroenteritis is also known as stomach flu. This condition affects the stomach and intestinal tract. It can cause sudden diarrhea and vomiting. The illness typically lasts 3 to 8 days. Most people develop an immune response that eventually gets rid of the virus. While this natural response develops, the virus can make you quite ill. CAUSES  Many different viruses can cause gastroenteritis, such as rotavirus or noroviruses. You can catch one of these viruses by consuming contaminated food or water. You may also catch a virus by sharing utensils or other personal items with an infected person or by touching a contaminated surface. SYMPTOMS  The most common symptoms are diarrhea and vomiting. These problems can cause a severe loss of body fluids (dehydration) and a body salt (electrolyte) imbalance. Other symptoms may include:  Fever.  Headache.  Fatigue.  Abdominal pain. DIAGNOSIS  Your caregiver can usually diagnose viral gastroenteritis based on your symptoms and a physical exam. A stool sample may also be taken to test for the presence of viruses or other infections. TREATMENT  This illness typically goes away on its own. Treatments are aimed at rehydration. The most serious cases of viral gastroenteritis involve vomiting so severely that you are not able to keep fluids down. In these cases, fluids must be given through an intravenous line (IV). HOME CARE INSTRUCTIONS   Drink enough fluids to keep your urine clear or pale yellow. Drink small amounts of fluids frequently and increase the amounts as tolerated.  Ask your caregiver for specific rehydration instructions.  Avoid:  Foods high in sugar.  Alcohol.  Carbonated drinks.  Tobacco.  Juice.  Caffeine drinks.  Extremely hot or cold fluids.  Fatty, greasy foods.  Too much intake of anything at one time.  Dairy products until 24 to 48 hours after diarrhea stops.  You may consume probiotics.  Probiotics are active cultures of beneficial bacteria. They may lessen the amount and number of diarrheal stools in adults. Probiotics can be found in yogurt with active cultures and in supplements.  Wash your hands well to avoid spreading the virus.  Only take over-the-counter or prescription medicines for pain, discomfort, or fever as directed by your caregiver. Do not give aspirin to children. Antidiarrheal medicines are not recommended.  Ask your caregiver if you should continue to take your regular prescribed and over-the-counter medicines.  Keep all follow-up appointments as directed by your caregiver. SEEK IMMEDIATE MEDICAL CARE IF:   You are unable to keep fluids down.  You do not urinate at least once every 6 to 8 hours.  You develop shortness of breath.  You notice blood in your stool or vomit. This may look like coffee grounds.  You have abdominal pain that increases or is concentrated in one small area (localized).  You have persistent vomiting or diarrhea.  You have a fever.  The patient is a child younger than 3 months, and he or she has a fever.  The patient is a child older than 3 months, and he or she has a fever and persistent symptoms.  The patient is a child older than 3 months, and he or she has a fever and symptoms suddenly get worse.  The patient is a baby, and he or she has no tears when crying. MAKE SURE YOU:   Understand these instructions.  Will watch your condition.  Will get help right away if you are not doing well or get worse. Document Released: 09/28/2005 Document Revised: 12/21/2011 Document Reviewed: 07/15/2011   [employer] Patient Information 2014 [employer].  

## 2013-08-26 LAB — CBC WITH DIFFERENTIAL/PLATELET
Basophils Absolute: 0 10*3/uL (ref 0.0–0.1)
Basophils Relative: 0 % (ref 0–1)
Eosinophils Relative: 0 % (ref 0–5)
Hemoglobin: 15.2 g/dL (ref 13.0–17.0)
Lymphocytes Relative: 16 % (ref 12–46)
MCH: 30.6 pg (ref 26.0–34.0)
MCV: 86.5 fL (ref 78.0–100.0)
Neutro Abs: 5.8 10*3/uL (ref 1.7–7.7)
Platelets: 149 10*3/uL — ABNORMAL LOW (ref 150–400)
RBC: 4.97 MIL/uL (ref 4.22–5.81)
RDW: 13.1 % (ref 11.5–15.5)
WBC: 9 10*3/uL (ref 4.0–10.5)

## 2013-08-26 LAB — COMPLETE METABOLIC PANEL WITH GFR
AST: 23 U/L (ref 0–37)
Albumin: 4 g/dL (ref 3.5–5.2)
Alkaline Phosphatase: 71 U/L (ref 39–117)
BUN: 32 mg/dL — ABNORMAL HIGH (ref 6–23)
Calcium: 8.6 mg/dL (ref 8.4–10.5)
Chloride: 100 mEq/L (ref 96–112)
Creat: 1.18 mg/dL (ref 0.50–1.35)
GFR, Est Non African American: 60 mL/min
Glucose, Bld: 131 mg/dL — ABNORMAL HIGH (ref 70–99)
Potassium: 4.1 mEq/L (ref 3.5–5.3)
Total Bilirubin: 0.8 mg/dL (ref 0.3–1.2)

## 2013-10-13 ENCOUNTER — Ambulatory Visit (INDEPENDENT_AMBULATORY_CARE_PROVIDER_SITE_OTHER): Payer: Medicare Other | Admitting: Physician Assistant

## 2013-10-13 ENCOUNTER — Encounter: Payer: Self-pay | Admitting: Physician Assistant

## 2013-10-13 VITALS — BP 121/58 | HR 64 | Temp 97.7°F | Wt 201.0 lb

## 2013-10-13 DIAGNOSIS — J019 Acute sinusitis, unspecified: Secondary | ICD-10-CM | POA: Diagnosis not present

## 2013-10-13 DIAGNOSIS — R05 Cough: Secondary | ICD-10-CM

## 2013-10-13 DIAGNOSIS — R059 Cough, unspecified: Secondary | ICD-10-CM

## 2013-10-13 MED ORDER — AMOXICILLIN-POT CLAVULANATE 875-125 MG PO TABS: 1.0000 | ORAL_TABLET | Freq: Two times a day (BID) | ORAL | Status: DC

## 2013-10-13 NOTE — Patient Instructions (Addendum)
[facility]  Sinusitis Sinusitis is redness, soreness, and swelling (inflammation) of the paranasal sinuses. Paranasal sinuses are air pockets within the bones of your face (beneath the eyes, the middle of the forehead, or above the eyes). In healthy paranasal sinuses, mucus is able to drain out, and air is able to circulate through them by way of your nose. However, when your paranasal sinuses are inflamed, mucus and air can become trapped. This can allow bacteria and other germs to grow and cause infection. Sinusitis can develop quickly and last only a short time (acute) or continue over a long period (chronic). Sinusitis that lasts for more than 12 weeks is considered chronic.  CAUSES  Causes of sinusitis include:  Allergies.  Structural abnormalities, such as displacement of the cartilage that separates your nostrils (deviated septum), which can decrease the air flow through your nose and sinuses and affect sinus drainage.  Functional abnormalities, such as when the small hairs (cilia) that line your sinuses and help remove mucus do not work properly or are not present. SYMPTOMS  Symptoms of acute and chronic sinusitis are the same. The primary symptoms are pain and pressure around the affected sinuses. Other symptoms include:  Upper toothache.  Earache.  Headache.  Bad breath.  Decreased sense of smell and taste.  A cough, which worsens when you are lying flat.  Fatigue.  Fever.  Thick drainage from your nose, which often is green and may contain pus (purulent).  Swelling and warmth over the affected sinuses. DIAGNOSIS  Your caregiver will perform a physical exam. During the exam, your caregiver may:  Look in your nose for signs of abnormal growths in your nostrils (nasal polyps).  Tap over the affected sinus to check for signs of infection.  View the inside of your sinuses (endoscopy) with a special imaging device with a light attached (endoscope), which is  inserted into your sinuses. If your caregiver suspects that you have chronic sinusitis, one or more of the following tests may be recommended:  Allergy tests.  Nasal culture A sample of mucus is taken from your nose and sent to a lab and screened for bacteria.  Nasal cytology A sample of mucus is taken from your nose and examined by your caregiver to determine if your sinusitis is related to an allergy. TREATMENT  Most cases of acute sinusitis are related to a viral infection and will resolve on their own within 10 days. Sometimes medicines are prescribed to help relieve symptoms (pain medicine, decongestants, nasal steroid sprays, or saline sprays).  However, for sinusitis related to a bacterial infection, your caregiver will prescribe antibiotic medicines. These are medicines that will help kill the bacteria causing the infection.  Rarely, sinusitis is caused by a fungal infection. In theses cases, your caregiver will prescribe antifungal medicine. For some cases of chronic sinusitis, surgery is needed. Generally, these are cases in which sinusitis recurs more than 3 times per year, despite other treatments. HOME CARE INSTRUCTIONS   Drink plenty of water. Water helps thin the mucus so your sinuses can drain more easily.  Use a humidifier.  Inhale steam 3 to 4 times a day (for example, sit in the bathroom with the shower running).  Apply a warm, moist washcloth to your face 3 to 4 times a day, or as directed by your caregiver.  Use saline nasal sprays to help moisten and clean your sinuses.  Take over-the-counter or prescription medicines for pain, discomfort, or fever only as directed by your  caregiver. SEEK IMMEDIATE MEDICAL CARE IF:  You have increasing pain or severe headaches.  You have nausea, vomiting, or drowsiness.  You have swelling around your face.  You have vision problems.  You have a stiff neck.  You have difficulty breathing. MAKE SURE YOU:   Understand these  instructions.  Will watch your condition.  Will get help right away if you are not doing well or get worse. Document Released: 09/28/2005 Document Revised: 12/21/2011 Document Reviewed: 10/13/2011 [facility] Patient Information 2014 [place], [place].

## 2013-10-16 NOTE — Progress Notes (Signed)
   Subjective:    Patient ID: Justin Mcgee, male    DOB: [birth year], 79 y.o.   MRN: [id number]  HPI Patient is a 79 year old male who presents to the clinic with sinus pressure, scratchy throat and nasal congestion for 5 days. Patient has tried Tylenol Cold and sinus OTC. It has not helped significantly. Patient denies any fever, chills, nausea, ear pain, shortness of breath, or wheezing. He does have a cough that is barely productive.   Review of Systems     Objective:   Physical Exam  Constitutional: He appears well-developed and well-nourished.  HENT:  Head: Normocephalic and atraumatic.  Right Ear: External ear normal.  Left Ear: External ear normal.  Mouth/Throat: Oropharynx is clear and moist.  TMs clear bilaterally.  Tenderness with palpation over maxillary sinuses bilaterally.  Bilateral nasal turbinates red and swollen.  Eyes: Conjunctivae are normal.  Neck: Normal range of motion. Neck supple.  Cardiovascular: Normal rate, regular rhythm and normal heart sounds.   Pulmonary/Chest: Effort normal and breath sounds normal. He has no wheezes.  Lymphadenopathy:    He has no cervical adenopathy.  Skin: Skin is warm and dry.  Psychiatric: He has a normal mood and affect. His behavior is normal.          Assessment & Plan:  Acute sinusitis- treated with augmentin for 10 days. Handout given. For cough discussed [facility] cold care or delsym. Consider sinus rinses.

## 2013-10-20 ENCOUNTER — Ambulatory Visit (INDEPENDENT_AMBULATORY_CARE_PROVIDER_SITE_OTHER): Payer: Medicare Other | Admitting: Family Medicine

## 2013-10-20 ENCOUNTER — Encounter: Payer: Self-pay | Admitting: Family Medicine

## 2013-10-20 VITALS — BP 133/72 | HR 82 | Temp 98.0°F | Wt 198.0 lb

## 2013-10-20 DIAGNOSIS — Z125 Encounter for screening for malignant neoplasm of prostate: Secondary | ICD-10-CM

## 2013-10-20 DIAGNOSIS — R7301 Impaired fasting glucose: Secondary | ICD-10-CM | POA: Diagnosis not present

## 2013-10-20 DIAGNOSIS — N400 Enlarged prostate without lower urinary tract symptoms: Secondary | ICD-10-CM | POA: Diagnosis not present

## 2013-10-20 LAB — POCT GLYCOSYLATED HEMOGLOBIN (HGB A1C): Hemoglobin A1C: 5.8

## 2013-10-20 MED ORDER — ESOMEPRAZOLE MAGNESIUM 40 MG PO CPDR: 40.0000 mg | DELAYED_RELEASE_CAPSULE | Freq: Every day | ORAL | Status: DC

## 2013-10-20 MED ORDER — CIPROFLOXACIN HCL 500 MG PO TABS: 500.0000 mg | ORAL_TABLET | Freq: Two times a day (BID) | ORAL | Status: AC

## 2013-10-20 NOTE — Patient Instructions (Signed)
Based on your symptoms being in the moderate category I. do think she would be a good candidate for one of the prostate medications. Please see the information below that an enlarged prostate. Medication none can also keep the prostate from continuing to get larger.    Benign Prostatic Hyperplasia An enlarged prostate (benign prostatic hyperplasia) is common in older men. You may experience the following:  Weak urine stream.  Dribbling.  Feeling like the bladder has not emptied completely.  Difficulty starting urination.  Getting up frequently at night to urinate.  Urinating more frequently during the day. HOME CARE INSTRUCTIONS  Monitor your prostatic hyperplasia for any changes. The following actions may help to alleviate any discomfort you are experiencing:  Give yourself time when you urinate.  Stay away from alcohol.  Avoid beverages containing caffeine, such as coffee, tea, and colas, because they can make the problem worse.  Avoid decongestants, antihistamines, and some prescription medicines that can make the problem worse.  Follow up with your health care provider for further treatment as recommended. SEEK MEDICAL CARE IF:  You are experiencing progressive difficulty voiding.  Your urine stream is progressively getting narrower.  You are awaking from sleep with the urge to void more frequently.  You are constantly feeling the need to void.  You experience loss of urine, especially in small amounts. SEEK IMMEDIATE MEDICAL CARE IF:   You develop increased pain with urination or are unable to urinate.  You develop severe abdominal pain, vomiting, a high fever, or fainting.  You develop back pain or blood in your urine. MAKE SURE YOU:   Understand these instructions.  Will watch your condition.  Will get help right away if you are not doing well or get worse. Document Released: 09/28/2005 Document Revised: 05/31/2013 Document Reviewed: 02/28/2013 [facility]  Patient Information 2014 [place].

## 2013-10-20 NOTE — Progress Notes (Signed)
   Subjective:    Patient ID: Justin Mcgee, male    DOB: [birth year], 79 y.o.   MRN: [id number]  HPI IFG - Here for 6 month follow-up on glucose. Has been eating a little better.   Lab Results  Component Value Date   HGBA1C 5.8 04/19/2013    BPH- Has hard time starting urinarion and hard time stopping. Will dribbles. Notices changes about 2 years ago.  Usually only gets up once to urinate.    Has 2 days of ABX left for sinusitis - he is actually feeling much better.  He did let me know he's tried to [place] soon for a business trip. He is mostly going to be and seems. He's not going in to any rural areas.  Review of Systems     Objective:   Physical Exam  Constitutional: He is oriented to person, place, and time. He appears well-developed and well-nourished.  Genitourinary: Guaiac negative stool.  Neurological: He is oriented to person, place, and time.  Skin: Skin is dry.  Psychiatric: He has a normal mood and affect.    Prostate is mildly enlarged. No nodules or induration or tenderness. Sphincter tone is normal. Guaiac was negative.      Assessment & Plan:  IFG - Well controlled. F/u in 6 months. Continue to work on exercise and diet.  Lab Results  Component Value Date   HGBA1C 5.8 10/20/2013    BPH - will check PSA today. We'll get old records for comparison. His AUA score is  9, which is moderate  . Discussed treatment options.  ED score of 11.  He says he is not interested in any type of medication at this point in time. Encouraged him to think about it.  Traveling to [place]-will give her prescription for traveler's diarrhea to take with him just in case.    Sinusitis- resolving.

## 2013-10-21 LAB — PSA: PSA: 5.96 ng/mL — ABNORMAL HIGH (ref <=4.00)

## 2014-01-18 ENCOUNTER — Ambulatory Visit (INDEPENDENT_AMBULATORY_CARE_PROVIDER_SITE_OTHER): Payer: Medicare Other | Admitting: Family Medicine

## 2014-01-18 ENCOUNTER — Ambulatory Visit (INDEPENDENT_AMBULATORY_CARE_PROVIDER_SITE_OTHER): Payer: Medicare Other

## 2014-01-18 ENCOUNTER — Encounter: Payer: Self-pay | Admitting: Family Medicine

## 2014-01-18 VITALS — BP 128/78 | HR 70 | Ht 75.0 in | Wt 202.0 lb

## 2014-01-18 DIAGNOSIS — M25569 Pain in unspecified knee: Secondary | ICD-10-CM | POA: Diagnosis not present

## 2014-01-18 DIAGNOSIS — M25562 Pain in left knee: Principal | ICD-10-CM

## 2014-01-18 DIAGNOSIS — R972 Elevated prostate specific antigen [PSA]: Secondary | ICD-10-CM | POA: Diagnosis not present

## 2014-01-18 DIAGNOSIS — M25561 Pain in right knee: Secondary | ICD-10-CM

## 2014-01-18 DIAGNOSIS — IMO0002 Reserved for concepts with insufficient information to code with codable children: Secondary | ICD-10-CM | POA: Diagnosis not present

## 2014-01-18 DIAGNOSIS — M1710 Unilateral primary osteoarthritis, unspecified knee: Secondary | ICD-10-CM | POA: Diagnosis not present

## 2014-01-18 DIAGNOSIS — R21 Rash and other nonspecific skin eruption: Secondary | ICD-10-CM

## 2014-01-18 MED ORDER — AMBULATORY NON FORMULARY MEDICATION: Status: DC

## 2014-01-18 MED ORDER — BETAMETHASONE DIPROPIONATE 0.05 % EX CREA: TOPICAL_CREAM | Freq: Every day | CUTANEOUS | Status: DC

## 2014-01-18 NOTE — Progress Notes (Signed)
   Subjective:    Patient ID: Justin Mcgee, male    DOB: [birth year], 79 y.o.   MRN: [id number]  HPI Bilat knee pain on and off x 6 months. Worse last 2-3 month. Using some IBU and Tylenol. Better with rest and worse with walking. Never had xrays.  No swelling.  No old injury or surgeries.  Has been wearing kneee sleeves for support.  Records from Dr. Morton Peters from [place].   He requests to be referred to an orthopedist.  RAsh on the back of the left ankle.  Has been using OTC antigungal clotrimazole cream for 2 weeks. Ran out of steroid I gave him last year. Says helping some. No pain or itching.    He is also here to followup on elevated PSA. Review of Systems     Objective:   Physical Exam  Constitutional: He appears well-developed and well-nourished.  Musculoskeletal:       Feet:  He has an erythematous scaly patch with irregular border just above the ankle on the left leg. It's approximately 6 x 7 cm in size. He has a somewhat smaller area on the top of the left foot that is less well demarcated with some erythema at the base and some fine scale on top. Area on the top of his foot is not bothersome to him. He says it's been there for years and doesn't change in size and does not bother him.  Skin: Skin is warm and dry. Rash noted.  Psychiatric: He has a normal mood and affect. His behavior is normal.          Assessment & Plan:  Bilat knee pain - I did not actually examine his knees today as he does prefer to be seen by specialist. I will him place order for x-rays to be done so he can followup with our sports medicine doctor in one to 2 weeks for consult.  Rash - continue antifungal cream since it does seem to be helping. There is some scale on the lesion. I did not do a skin scraping because he started been using a topical antifungal for 2 weeks he'll likely be negative. He can also apply a small amount of steroid cream on top. Please call if any palms or side effects or  concerns or if the rash is not resolving.  Elevated PSA-due to repeat today. It had jumped up at the last office visit and had recommended repeating in 2-3 months.  Discussed need for shingles vaccine. Gave him a handwritten prescription to take to the pharmacy as this is covered under Medicare part D. He can certainly have the monitor his insurance and see what the potential causes.

## 2014-01-19 LAB — PSA: PSA: 5.9 ng/mL — ABNORMAL HIGH (ref <=4.00)

## 2014-01-31 ENCOUNTER — Encounter: Payer: Self-pay | Admitting: Family Medicine

## 2014-02-01 ENCOUNTER — Ambulatory Visit: Payer: Medicare Other | Admitting: Sports Medicine

## 2014-02-02 ENCOUNTER — Encounter: Payer: Self-pay | Admitting: Sports Medicine

## 2014-02-02 ENCOUNTER — Ambulatory Visit (INDEPENDENT_AMBULATORY_CARE_PROVIDER_SITE_OTHER): Payer: Medicare Other | Admitting: Sports Medicine

## 2014-02-02 VITALS — BP 129/61 | HR 78 | Ht 75.0 in | Wt 205.0 lb

## 2014-02-02 DIAGNOSIS — M170 Bilateral primary osteoarthritis of knee: Secondary | ICD-10-CM | POA: Insufficient documentation

## 2014-02-02 DIAGNOSIS — M1710 Unilateral primary osteoarthritis, unspecified knee: Secondary | ICD-10-CM

## 2014-02-02 MED ORDER — DICLOFENAC SODIUM 2 % TD SOLN: 2.0000 | Freq: Two times a day (BID) | TRANSDERMAL | Status: DC

## 2014-02-02 NOTE — Assessment & Plan Note (Signed)
This young man desires to proceed conservatively. He takes an oral analgesic which is tremendously effective. He can continue his knee sleeves. Formal physical therapy, topical diclofenac. Return to see me in one month, injection of the bladder.

## 2014-02-02 NOTE — Progress Notes (Addendum)
   Subjective:    I'm seeing this patient as a consultation for:  Dr. Madilyn Fireman  CC: Bilateral knee pain  HPI: This is a very pleasant 79 year old male, he has bilateral knee pain that he localizes at the joint lines, is mild, persistent, he takes an over-the-counter NSAID that he feels is significantly effective, it does not bother him at night. He would rather pursue conservative measures.  He also has some radicular symptoms that he would like to discuss at a future visit.  Past medical history, Surgical history, Family history not pertinant except as noted below, Social history, Allergies, and medications have been entered into the medical record, reviewed, and no changes needed.   Review of Systems: No headache, visual changes, nausea, vomiting, diarrhea, constipation, dizziness, abdominal pain, skin rash, fevers, chills, night sweats, weight loss, swollen lymph nodes, body aches, joint swelling, muscle aches, chest pain, shortness of breath, mood changes, visual or auditory hallucinations.   Objective:   General: Well Developed, well nourished, and in no acute distress.  Neuro/Psych: Alert and oriented x3, extra-ocular muscles intact, able to move all 4 extremities, sensation grossly intact. Skin: Warm and dry, no rashes noted.  Respiratory: Not using accessory muscles, speaking in full sentences, trachea midline.  Cardiovascular: Pulses palpable, no extremity edema. Abdomen: Does not appear distended. Bilateral Knee: Normal to inspection with no erythema or effusion or obvious bony abnormalities. Palpation normal with no warmth, joint line tenderness, patellar tenderness, or condyle tenderness. ROM full in flexion and extension and lower leg rotation. Ligaments with solid consistent endpoints including ACL, PCL, LCL, MCL. Negative Mcmurray's, Apley's, and Thessalonian tests. Non painful patellar compression. Patellar glide without crepitus. Patellar and quadriceps tendons  unremarkable. Hamstring and quadriceps strength is normal.   Knee x-rays do show moderate tricompartmental osteoarthritis.  Impression and Recommendations:    I spent 40 minutes with this patient, greater than 50% was face-to-face time counseling regarding the above diagnoses.

## 2014-02-07 ENCOUNTER — Ambulatory Visit (INDEPENDENT_AMBULATORY_CARE_PROVIDER_SITE_OTHER): Payer: Medicare Other | Admitting: Physical Therapy

## 2014-02-07 DIAGNOSIS — M25569 Pain in unspecified knee: Secondary | ICD-10-CM

## 2014-02-07 DIAGNOSIS — R269 Unspecified abnormalities of gait and mobility: Secondary | ICD-10-CM

## 2014-02-07 DIAGNOSIS — M1710 Unilateral primary osteoarthritis, unspecified knee: Secondary | ICD-10-CM

## 2014-02-12 ENCOUNTER — Encounter (INDEPENDENT_AMBULATORY_CARE_PROVIDER_SITE_OTHER): Payer: Medicare Other | Admitting: Physical Therapy

## 2014-02-12 DIAGNOSIS — M25569 Pain in unspecified knee: Secondary | ICD-10-CM

## 2014-02-12 DIAGNOSIS — R269 Unspecified abnormalities of gait and mobility: Secondary | ICD-10-CM

## 2014-02-12 DIAGNOSIS — M1710 Unilateral primary osteoarthritis, unspecified knee: Secondary | ICD-10-CM

## 2014-02-13 DIAGNOSIS — H2640 Unspecified secondary cataract: Secondary | ICD-10-CM | POA: Diagnosis not present

## 2014-02-13 DIAGNOSIS — H40019 Open angle with borderline findings, low risk, unspecified eye: Secondary | ICD-10-CM | POA: Diagnosis not present

## 2014-02-15 ENCOUNTER — Encounter (INDEPENDENT_AMBULATORY_CARE_PROVIDER_SITE_OTHER): Payer: Medicare Other | Admitting: Physical Therapy

## 2014-02-15 DIAGNOSIS — M1710 Unilateral primary osteoarthritis, unspecified knee: Secondary | ICD-10-CM

## 2014-02-15 DIAGNOSIS — M25569 Pain in unspecified knee: Secondary | ICD-10-CM

## 2014-02-15 DIAGNOSIS — R269 Unspecified abnormalities of gait and mobility: Secondary | ICD-10-CM

## 2014-02-19 ENCOUNTER — Encounter: Payer: Medicare Other | Admitting: Physical Therapy

## 2014-02-22 ENCOUNTER — Encounter (INDEPENDENT_AMBULATORY_CARE_PROVIDER_SITE_OTHER): Payer: Medicare Other | Admitting: Physical Therapy

## 2014-02-22 DIAGNOSIS — M1710 Unilateral primary osteoarthritis, unspecified knee: Secondary | ICD-10-CM

## 2014-02-22 DIAGNOSIS — R269 Unspecified abnormalities of gait and mobility: Secondary | ICD-10-CM

## 2014-02-22 DIAGNOSIS — M25569 Pain in unspecified knee: Secondary | ICD-10-CM

## 2014-02-28 ENCOUNTER — Encounter (INDEPENDENT_AMBULATORY_CARE_PROVIDER_SITE_OTHER): Payer: Medicare Other

## 2014-02-28 DIAGNOSIS — R269 Unspecified abnormalities of gait and mobility: Secondary | ICD-10-CM | POA: Diagnosis not present

## 2014-02-28 DIAGNOSIS — R7301 Impaired fasting glucose: Secondary | ICD-10-CM | POA: Diagnosis present

## 2014-02-28 DIAGNOSIS — M1710 Unilateral primary osteoarthritis, unspecified knee: Secondary | ICD-10-CM | POA: Diagnosis not present

## 2014-02-28 DIAGNOSIS — M25569 Pain in unspecified knee: Secondary | ICD-10-CM | POA: Diagnosis not present

## 2014-03-02 ENCOUNTER — Ambulatory Visit (INDEPENDENT_AMBULATORY_CARE_PROVIDER_SITE_OTHER): Payer: Medicare Other | Admitting: Sports Medicine

## 2014-03-02 ENCOUNTER — Encounter: Payer: Self-pay | Admitting: Sports Medicine

## 2014-03-02 VITALS — BP 153/69 | HR 71 | Ht 75.0 in | Wt 203.0 lb

## 2014-03-02 DIAGNOSIS — M170 Bilateral primary osteoarthritis of knee: Secondary | ICD-10-CM

## 2014-03-02 DIAGNOSIS — M1710 Unilateral primary osteoarthritis, unspecified knee: Secondary | ICD-10-CM

## 2014-03-02 NOTE — Progress Notes (Signed)
  Subjective:    CC: Followup  HPI: Bilateral knee osteoarthritis : pain has always been mild, well controlled with oral analgesics, he has not even used a topical diclofenac, physical therapy has been effective. He is happy with his results so far and doesn't desire any further interventions.  Past medical history, Surgical history, Family history not pertinant except as noted below, Social history, Allergies, and medications have been entered into the medical record, reviewed, and no changes needed.   Review of Systems: No fevers, chills, night sweats, weight loss, chest pain, or shortness of breath.   Objective:    General: Well Developed, well nourished, and in no acute distress.  Neuro: Alert and oriented x3, extra-ocular muscles intact, sensation grossly intact.  HEENT: Normocephalic, atraumatic, pupils equal round reactive to light, neck supple, no masses, no lymphadenopathy, thyroid nonpalpable.  Skin: Warm and dry, no rashes. Cardiac: Regular rate and rhythm, no murmurs rubs or gallops, no lower extremity edema.  Respiratory: Clear to auscultation bilaterally. Not using accessory muscles, speaking in full sentences. Left Knee: Normal to inspection with no erythema or effusion or obvious bony abnormalities. Palpation normal with no warmth, joint line tenderness, patellar tenderness, or condyle tenderness. ROM full in flexion and extension and lower leg rotation. Ligaments with solid consistent endpoints including ACL, PCL, LCL, MCL. Negative Mcmurray's, Apley's, and Thessalonian tests. Non painful patellar compression. Patellar glide without crepitus. Patellar and quadriceps tendons unremarkable. Hamstring and quadriceps strength is normal.   Impression and Recommendations:

## 2014-03-02 NOTE — Assessment & Plan Note (Signed)
Doing well with oral analgesics, knee sleeves, topical diclofenac. Physical therapy has been effective. At this point he is happy with his current situation, I'm happy to consider steroid versus viscous supplementation injections if pain returns in the future. Return to see me as needed.

## 2014-04-19 ENCOUNTER — Ambulatory Visit (INDEPENDENT_AMBULATORY_CARE_PROVIDER_SITE_OTHER): Payer: Medicare Other | Admitting: Family Medicine

## 2014-04-19 ENCOUNTER — Encounter: Payer: Self-pay | Admitting: Family Medicine

## 2014-04-19 VITALS — BP 116/62 | HR 62 | Ht 75.0 in | Wt 207.0 lb

## 2014-04-19 DIAGNOSIS — N400 Enlarged prostate without lower urinary tract symptoms: Secondary | ICD-10-CM | POA: Diagnosis not present

## 2014-04-19 DIAGNOSIS — R21 Rash and other nonspecific skin eruption: Secondary | ICD-10-CM

## 2014-04-19 DIAGNOSIS — R972 Elevated prostate specific antigen [PSA]: Secondary | ICD-10-CM | POA: Diagnosis not present

## 2014-04-19 DIAGNOSIS — R7301 Impaired fasting glucose: Secondary | ICD-10-CM | POA: Diagnosis not present

## 2014-04-19 LAB — POCT GLYCOSYLATED HEMOGLOBIN (HGB A1C): HEMOGLOBIN A1C: 5.8

## 2014-04-19 MED ORDER — FLUCONAZOLE 150 MG PO TABS: 300.0000 mg | ORAL_TABLET | ORAL | Status: DC

## 2014-04-19 NOTE — Progress Notes (Signed)
Subjective:    Patient ID: Justin Mcgee, male    DOB: [birth year], 79 y.o.   MRN: [id number]  HPI Itechy rash x3 weeks on the left antecubital fossa and on both shoulders. He can use an over-the-counter antifungal jock itch cream which does seem to provide a small amount of relief. Yesterday he switched to a steroid cream and says it actually looked worse this morning.  Impaired fasting glucose- no inc thirst or urination.    BPH - says it takes longer for him to empty his bladder has difficulty starting. He is not interested in any medications though. He does take saw palmetto over-the-counter. Lab Results  Component Value Date   PSA 5.90* 01/18/2014   PSA 5.96* 10/20/2013    Review of Systems     BP 116/62  Pulse 62  Ht 6\' 3"  (1.905 m)  Wt 207 lb (93.895 kg)  BMI 25.87 kg/m2    Allergies  Allergen Reactions  . Other Other (See Comments)    Topical sulfa only. Gets skin irritation.    Past Medical History  Diagnosis Date  . Squamous cell cancer of buccal mucosa 2004    Dr. Marica Otter, no radiation or chemo, clear LN.   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 Fracture of right radius 1956  . Fracture, metacarpal 1956    1, 2, 3  . Fracture, tibia 1964  . Former smoker     Past Surgical History  Procedure Laterality Date  . Cholecystectomy  05/16/10    in [place] while on vacation  . Appendectomy  1997  . Inguinal hernia repair Right 1965, 1968  . Inguinal hernia repair Left 1994  . Partial glossectomy      with unilat neck dissection  . Cataract extraction      History   Social History  . Marital Status: Married    Spouse Name: Justin Mcgee     Number of Children: 2  . Years of Education: N/A   Occupational History  . [employer] manager [employer]   Social History Main Topics  . Smoking status: Former Smoker    Quit date: 02/19/1978  . Smokeless tobacco: Not on file  . Alcohol Use: No  . Drug Use: No  . Sexual Activity: Yes     Partners: Female   Other Topics Concern  . Not on file   Social History Narrative   4 caffeine drink per day.  40 min cardio/strength 6 days per week. Retired from [employer].              Family History  Problem Relation Age of Onset  . GER disease Father   . Other Father     [place]  . Diabetes      Outpatient Encounter Prescriptions as of 04/19/2014  Medication Sig  . AMBULATORY NON FORMULARY MEDICATION Medication Name: Shingles vaccine IM x 1  . betamethasone dipropionate (DIPROLENE) 0.05 % cream Apply topically daily.  . Cholecalciferol (VITAMIN D3) 2000 UNITS TABS Take 1 tablet by mouth daily.  . Cyanocobalamin (B-12) 1000 MCG CAPS Take 1 tablet by mouth daily.  . Diclofenac Sodium 2 % SOLN Place 2 sprays onto the skin 2 (two) times daily.  [name] esomeprazole (NEXIUM) 40 MG capsule Take 1 capsule (40 mg total) by mouth daily at 12 noon.  . Multiple Vitamin (MULTIVITAMIN) capsule Take 1 capsule by mouth daily.  . Saw Palmetto 450 MG  CAPS Take 1 capsule by mouth daily.  . fluconazole (DIFLUCAN) 150 MG tablet Take 2 tablets (300 mg total) by mouth once a week.       Objective:   Physical Exam  Constitutional: He is oriented to person, place, and time. He appears well-developed and well-nourished.  HENT:  Head: Normocephalic and atraumatic.  Neurological: He is alert and oriented to person, place, and time.  Skin:  There is a rash in the left antecubital fossa. There are round macular erythematous lesions that blanch with pressure. A little bit of fine scale. Some of the lesions are in a linear fashion. The area on his posterior neck is quite large and is approximately 3 x 10". It has a well demarcated border and has more a day pink appearance. There are some macular scaly lesions and some confluent areas.  Psychiatric: He has a normal mood and affect. His behavior is normal.          Assessment & Plan:  Rash-looks more fungal on exam, consistent with  tinea corporis. Since he started been using a topical antifungal cream with only minimal improvement from [place] 3 weeks we'll go ahead and treat with oral agent. Prescribe Diflucan 3 her milligrams once a week x4 weeks. If not improving after 2-3 weeks then please let me know. KOH skin scraping performed today he will call for results when available. Avoid using a topical steroid.  BPH-  symptoms are moderate. Not currently well controlled but he is against taking medication for it and wants to continue with his over-the-counter saw palmetto. ROM check PSA again in 3 months.  Impaired fasting glucose-hemoglobin A1c today is 5.8. Stable. Followup in 6 months.

## 2014-04-20 LAB — KOH PREP: RESULT - KOH: NONE SEEN

## 2014-06-06 ENCOUNTER — Other Ambulatory Visit: Payer: Self-pay | Admitting: Family Medicine

## 2014-07-05 ENCOUNTER — Ambulatory Visit (INDEPENDENT_AMBULATORY_CARE_PROVIDER_SITE_OTHER): Payer: Medicare Other | Admitting: Family Medicine

## 2014-07-05 ENCOUNTER — Ambulatory Visit (HOSPITAL_BASED_OUTPATIENT_CLINIC_OR_DEPARTMENT_OTHER)
Admission: RE | Admit: 2014-07-05 | Discharge: 2014-07-05 | Disposition: A | Discharge status: Home / Self Care | Payer: Medicare Other | Source: Ambulatory Visit | Attending: Family Medicine | Admitting: Family Medicine

## 2014-07-05 ENCOUNTER — Encounter: Payer: Self-pay | Admitting: Family Medicine

## 2014-07-05 VITALS — BP 127/64 | HR 64 | Temp 98.0°F | Wt 201.0 lb

## 2014-07-05 DIAGNOSIS — M7989 Other specified soft tissue disorders: Secondary | ICD-10-CM

## 2014-07-05 DIAGNOSIS — M79609 Pain in unspecified limb: Secondary | ICD-10-CM | POA: Diagnosis not present

## 2014-07-05 DIAGNOSIS — M79674 Pain in right toe(s): Secondary | ICD-10-CM

## 2014-07-05 DIAGNOSIS — M79671 Pain in right foot: Secondary | ICD-10-CM

## 2014-07-05 NOTE — Progress Notes (Signed)
   Subjective:    Patient ID: Justin Mcgee, male    DOB: Oct 14, 1934, 79 y.o.   MRN: [id number]  HPI Patient flew to [place] on September 12. He did wear compression stockings in a brace on his knee. After he arrived he realized that he had a blister on his second and third toes. The blister on the second toe are open the following day. The blister on the third toe just resolves on its own. He has not had any fevers chills or sweats. He felt like his toes and the top of his foot distally looked almost black in appearance. He has been told in the past that he has venous stasis. He flew back last night without compression stockings. He noted to have been a little bit more swollen and darker looking than the left foot. He has had some discomfort.   Review of Systems     Objective:   Physical Exam  Constitutional: He appears well-developed and well-nourished.  HENT:  Head: Normocephalic and atraumatic.  Musculoskeletal: He exhibits edema.  Right foot overall has a more purple discoloration compared to his left foot. He does have some spider veins and varicose veins. He is tender with calf squeeze over the right calf. I am able to palpate a dorsal pedal pulse, 1+. He has edema at the distal foot and over the toes. He does have a blister on the second toe which is healing well. No streaking erythema.  Skin: Skin is warm and dry.  Psychiatric: He has a normal mood and affect.          Assessment & Plan:  Blister-appears to be healing well. I see no sign of cellulitis or streaking erythema. No fever chills or sweats.  Right foot swelling-recommend further evaluation with a venous Doppler to rule out DVT with prolonged travel since he has slight discoloration of the right foot compared to the left, swelling, and some tenderness over the right calf with compression.

## 2014-07-05 NOTE — Patient Instructions (Signed)
Please arrive at [facility] at 345. You're scheduled for an ultrasound of your right leg at 4:00.

## 2014-07-06 ENCOUNTER — Ambulatory Visit: Payer: Medicare Other | Admitting: Physician Assistant

## 2014-07-06 DIAGNOSIS — Z0289 Encounter for other administrative examinations: Secondary | ICD-10-CM

## 2014-07-13 ENCOUNTER — Other Ambulatory Visit: Payer: Self-pay | Admitting: Family Medicine

## 2014-07-13 ENCOUNTER — Other Ambulatory Visit (HOSPITAL_COMMUNITY): Payer: Self-pay | Admitting: Family Medicine

## 2014-07-13 DIAGNOSIS — I70209 Unspecified atherosclerosis of native arteries of extremities, unspecified extremity: Secondary | ICD-10-CM

## 2014-07-18 ENCOUNTER — Ambulatory Visit (HOSPITAL_COMMUNITY)
Admission: RE | Admit: 2014-07-18 | Discharge: 2014-07-18 | Disposition: A | Discharge status: Home / Self Care | Payer: Medicare Other | Source: Ambulatory Visit | Attending: Vascular Surgery | Admitting: Vascular Surgery

## 2014-07-18 DIAGNOSIS — I739 Peripheral vascular disease, unspecified: Secondary | ICD-10-CM | POA: Insufficient documentation

## 2014-07-18 DIAGNOSIS — I70209 Unspecified atherosclerosis of native arteries of extremities, unspecified extremity: Secondary | ICD-10-CM

## 2014-07-18 DIAGNOSIS — I70208 Unspecified atherosclerosis of native arteries of extremities, other extremity: Secondary | ICD-10-CM | POA: Insufficient documentation

## 2014-07-18 NOTE — Progress Notes (Signed)
VASCULAR LAB PRELIMINARY  ARTERIAL  ABI completed:    RIGHT    LEFT    PRESSURE WAVEFORM  PRESSURE WAVEFORM  BRACHIAL 125 Triphasic BRACHIAL 121 Triphasic  DP 154 Triphasic DP 147 Triphasic  PT 168 Triphasic PT 151 Triphasic  Great Toe 90  Great Toe 90     RIGHT LEFT  ABI / TBI 1.34 / 0.72 1.21 / 0.72   ABIs and Doppler waveforms are within normal limits bilaterally. Great toe TBIs indicate adequate perfusion bilaterally.  Gardner Servantes, RVS 07/18/2014, 11:17 AM

## 2014-09-04 DIAGNOSIS — H40013 Open angle with borderline findings, low risk, bilateral: Secondary | ICD-10-CM | POA: Diagnosis not present

## 2014-10-02 ENCOUNTER — Ambulatory Visit (INDEPENDENT_AMBULATORY_CARE_PROVIDER_SITE_OTHER): Payer: Medicare Other | Admitting: Family Medicine

## 2014-10-02 ENCOUNTER — Encounter: Payer: Self-pay | Admitting: Family Medicine

## 2014-10-02 VITALS — BP 118/60 | HR 72 | Wt 203.0 lb

## 2014-10-02 DIAGNOSIS — I70208 Unspecified atherosclerosis of native arteries of extremities, other extremity: Secondary | ICD-10-CM | POA: Diagnosis not present

## 2014-10-02 DIAGNOSIS — R21 Rash and other nonspecific skin eruption: Secondary | ICD-10-CM | POA: Diagnosis not present

## 2014-10-02 MED ORDER — FLUCONAZOLE 150 MG PO TABS: 300.0000 mg | ORAL_TABLET | ORAL | Status: DC

## 2014-10-02 NOTE — Progress Notes (Signed)
   Subjective:    Patient ID: Justin Mcgee, male    DOB: [birth year], 79 y.o.   MRN: [id number]  HPI rash on back x 2 wks got worse the past 4 days. no changes in diet, soap, lotion, or detergent. he has been using cortisone cream. He feels like this is a similar rash to what he had back in July. At that time we had done a skin scraping which was negative but had gone ahead and treated him with oral Diflucan since he had a partial response with topical antifungal that he had been using at home. He said the topical steroid creams and actually exacerbate his symptoms. Though it was over-the-counter hydrocortisone. He says after he took the 300 mg of Diflucan he says the rash completely resolved in about 3 days. He had very brisk response. He says it started back about 2 weeks ago. His wife feels like it's gotten a little bit larger in the past 4 days.   Review of Systems     Objective:   Physical Exam  Constitutional: He appears well-developed and well-nourished.  HENT:  Head: Normocephalic and atraumatic.  Pulmonary/Chest:    Skin: Skin is warm and dry.  Psychiatric: He has a normal mood and affect. His behavior is normal.          Assessment & Plan:  Rash- unclear etiology at this time.  Since has brisk response to antifungal 4 months ago offered to treat again versus doing a biopsy for further evaluation. He opted to try the Diflucan again. If the rash does not resolve or if it recurs again and I definitely want to do a skin biopsy.  KOH done again today.

## 2014-10-04 LAB — KOH PREP: RESULT - KOH: NONE SEEN

## 2014-10-28 ENCOUNTER — Other Ambulatory Visit: Payer: Self-pay | Admitting: Family Medicine

## 2014-11-12 ENCOUNTER — Encounter: Payer: Self-pay | Admitting: Physician Assistant

## 2014-11-12 ENCOUNTER — Ambulatory Visit (INDEPENDENT_AMBULATORY_CARE_PROVIDER_SITE_OTHER): Payer: Medicare Other | Admitting: Physician Assistant

## 2014-11-12 VITALS — BP 112/58 | HR 68 | Temp 98.0°F | Wt 204.0 lb

## 2014-11-12 DIAGNOSIS — L308 Other specified dermatitis: Secondary | ICD-10-CM | POA: Diagnosis not present

## 2014-11-12 DIAGNOSIS — J019 Acute sinusitis, unspecified: Secondary | ICD-10-CM

## 2014-11-12 DIAGNOSIS — R21 Rash and other nonspecific skin eruption: Secondary | ICD-10-CM

## 2014-11-12 MED ORDER — AMOXICILLIN-POT CLAVULANATE 875-125 MG PO TABS: 1.0000 | ORAL_TABLET | Freq: Two times a day (BID) | ORAL | Status: DC

## 2014-11-12 MED ORDER — FLUCONAZOLE 150 MG PO TABS: 300.0000 mg | ORAL_TABLET | ORAL | Status: DC

## 2014-11-12 NOTE — Patient Instructions (Signed)
Biopsy Care After Refer to this sheet in the next few weeks. These instructions provide you with information on caring for yourself after your procedure. Your caregiver may also give you more specific instructions. Your treatment has been planned according to current medical practices, but problems sometimes occur. Call your caregiver if you have any problems or questions after your procedure. If you had a fine needle biopsy, you may have soreness at the biopsy site for 1 to 2 days. If you had an open biopsy, you may have soreness at the biopsy site for 3 to 4 days. HOME CARE INSTRUCTIONS   You may resume normal diet and activities as directed.  Change bandages (dressings) as directed. If your wound was closed with a skin glue (adhesive), it will wear off and begin to peel in 7 days.  Only take over-the-counter or prescription medicines for pain, discomfort, or fever as directed by your caregiver.  Ask your caregiver when you can bathe and get your wound wet. SEEK IMMEDIATE MEDICAL CARE IF:   You have increased bleeding (more than a small spot) from the biopsy site.  You notice redness, swelling, or increasing pain at the biopsy site.  You have pus coming from the biopsy site.  You have a fever.  You notice a bad smell coming from the biopsy site or dressing.  You have a rash, have difficulty breathing, or have any allergic problems. MAKE SURE YOU:   Understand these instructions.  Will watch your condition.  Will get help right away if you are not doing well or get worse. Document Released: 04/17/2005 Document Revised: 12/21/2011 Document Reviewed: 03/26/2011 ExitCare Patient Information 2015 [employer]. This information is not intended to replace advice given to you by your health care provider. Make sure you discuss any questions you have with your health care provider.  

## 2014-11-12 NOTE — Progress Notes (Signed)
Subjective:    Patient ID: Justin Mcgee, male    DOB: 1935-07-29, 80 y.o.   MRN: [id number]  HPI  Patient is a 80 year old man who presents to the clinic with sinusitis symptoms and unresolving rash.  Rash over right upper shoulder on back started to clear up with Diflucan that Dr. Charise Carwin gave him about 2 weeks ago. It has now started coming back. He denies any pain. He does describe it as itchy. He has also been using some betamethasone cream as well which he felt like does help with itch. KOH scrapings in the past have been negative for yeast. Dr. Charise Carwin mention biopsy if not resolving.  For the last week and half to 2 weeks patient has not had a lot of sinus pressure, ear pressure and postnasal drip. He traveled to [facility] the weekend of January 18. He did clot and minute seem to make symptoms worse. He describes a lot of green thick mucus coming from the nasal cavity. He has a mild sore throat. His cough is mostly dry with little production. He denies any shortness of breath or wheezing. He has been taking DayQuil and NyQuil with some relief.     Review of Systems  All other systems reviewed and are negative.      Objective:   Physical Exam  Constitutional: He is oriented to person, place, and time. He appears well-developed and well-nourished.  HENT:  Head: Normocephalic and atraumatic.  Bilateral TMs slightly bulging with some erythema and dullness. No active pus.  Oropharynx erythematous with no tonsillar swelling or exudate.  Tenderness over frontal and maxillary sinuses to palpation bilaterally.   Nasal turbinates red and swollen.   Eyes: Conjunctivae are normal. Right eye exhibits no discharge. Left eye exhibits no discharge.  Neck: Normal range of motion. Neck supple.  Cardiovascular: Normal rate, regular rhythm and normal heart sounds.   Pulmonary/Chest: Effort normal and breath sounds normal. He has no wheezes.  Lymphadenopathy:    He has no cervical  adenopathy.  Neurological: He is alert and oriented to person, place, and time.  Skin: Skin is dry.     Psychiatric: He has a normal mood and affect. His behavior is normal.          Assessment & Plan:  Acute sinusitis- treated with augmentin for 10 days. HO given. Follow up if not improving.    Rash- biopsy today. Treated with diflucan that was given at last visit;however concerned that bethmethasone cream that patient has assess too could be feeding if fungal. Will call with results.   Punch Biopsy Procedure Note  Pre-operative Diagnosis: unknown rash  Post-operative Diagnosis: same  Locations  Upper right shoulder  Indications: unknown rash  Anesthesia: Lidocaine 1% without epinephrine without added sodium bicarbonate  Procedure Details  History of allergy to iodine: no Patient informed of the risks (including bleeding and infection) and benefits of the  procedure and Verbal informed consent obtained.  The lesion and surrounding area was given a sterile prep using chlorhexidine and draped in the usual sterile fashion. The skin was then stretched perpendicular to the skin tension lines and the lesion removed using the 33mm punch. The resulting ellipse was then closed. The wound was closed with 4-0 Prolene using simple interrupted stitches. Antibiotic ointment and a sterile dressing applied. The specimen was sent for pathologic examination. The patient tolerated the procedure well.  EBL: scant  Condition: Stable  Complications: none.  Plan: 1. Instructed to keep the wound  dry and covered for 24-48h and clean thereafter. 2. Warning signs of infection were reviewed.   3. Recommended that the patient use OTC acetaminophen as needed for pain.  4. Return for suture removal in 7 days.

## 2014-11-16 ENCOUNTER — Telehealth: Payer: Self-pay | Admitting: Physician Assistant

## 2014-11-16 DIAGNOSIS — L308 Other specified dermatitis: Secondary | ICD-10-CM

## 2014-11-16 MED ORDER — PREDNISONE 20 MG PO TABS: ORAL_TABLET | ORAL | Status: DC

## 2014-11-16 NOTE — Telephone Encounter (Signed)
Call pt: biopsy report end showed a type of dermatitis. Will get course of oral prednisone to take. Stop diflucan(not fungal). Continue to use betamethasome cream as needed topically if continues to flare. Start zyrtec at bedtime to see if helps calm down any reaction to something you are coming in contact with.

## 2014-11-19 NOTE — Telephone Encounter (Signed)
Pt notified of results & rx.

## 2014-11-23 ENCOUNTER — Telehealth: Payer: Self-pay | Admitting: *Deleted

## 2014-11-23 NOTE — Telephone Encounter (Signed)
Pt called and lvm asking if he should cont on the fluconazole. I called him back and lvm informing him that he is to d/c this.Elouise Munroe     Call pt: biopsy report end showed a type of dermatitis. Will get course of oral prednisone to take. Stop diflucan(not fungal). Continue to use betamethasome cream as needed topically if continues to flare. Start zyrtec at bedtime to see if helps calm down any reaction to something you are coming in contact with.(jade's note)

## 2015-02-15 ENCOUNTER — Other Ambulatory Visit: Payer: Self-pay | Admitting: Physician Assistant

## 2015-02-15 ENCOUNTER — Telehealth: Payer: Self-pay | Admitting: *Deleted

## 2015-02-15 MED ORDER — PREDNISONE 20 MG PO TABS: ORAL_TABLET | ORAL | Status: DC

## 2015-02-15 MED ORDER — BETAMETHASONE DIPROPIONATE 0.05 % EX CREA: TOPICAL_CREAM | Freq: Every day | CUTANEOUS | Status: DC

## 2015-02-15 NOTE — Telephone Encounter (Signed)
Pt informed that rx has been sent.Justin Mcgee Saline

## 2015-02-15 NOTE — Telephone Encounter (Signed)
Pt called and stated that the rash that he was treated for in February has flared up again. He is asking if he can get another rx for prednisone sent to his pharm. I advised pt that I would need to fwd this to [facility] for advise.Audelia Hives [place]

## 2015-02-15 NOTE — Telephone Encounter (Signed)
Ok to send refills of betamethasone. Confirm local or [facility] to send. Ok for a refill as well.

## 2015-04-07 ENCOUNTER — Other Ambulatory Visit: Payer: Self-pay | Admitting: Family Medicine

## 2015-06-12 DIAGNOSIS — H40013 Open angle with borderline findings, low risk, bilateral: Secondary | ICD-10-CM | POA: Diagnosis not present

## 2015-06-12 DIAGNOSIS — Z961 Presence of intraocular lens: Secondary | ICD-10-CM | POA: Diagnosis not present

## 2015-06-12 DIAGNOSIS — H26491 Other secondary cataract, right eye: Secondary | ICD-10-CM | POA: Diagnosis not present

## 2015-07-05 ENCOUNTER — Other Ambulatory Visit: Payer: Self-pay | Admitting: Family Medicine

## 2015-07-17 ENCOUNTER — Encounter: Payer: Self-pay | Admitting: Family Medicine

## 2015-07-17 ENCOUNTER — Ambulatory Visit (INDEPENDENT_AMBULATORY_CARE_PROVIDER_SITE_OTHER): Payer: Medicare Other | Admitting: Family Medicine

## 2015-07-17 VITALS — BP 119/53 | HR 58 | Temp 97.7°F | Wt 199.0 lb

## 2015-07-17 DIAGNOSIS — Z85819 Personal history of malignant neoplasm of unspecified site of lip, oral cavity, and pharynx: Secondary | ICD-10-CM

## 2015-07-17 DIAGNOSIS — R202 Paresthesia of skin: Secondary | ICD-10-CM

## 2015-07-17 DIAGNOSIS — K2100 Gastro-esophageal reflux disease with esophagitis, without bleeding: Secondary | ICD-10-CM

## 2015-07-17 DIAGNOSIS — Z23 Encounter for immunization: Secondary | ICD-10-CM

## 2015-07-17 DIAGNOSIS — E781 Pure hyperglyceridemia: Secondary | ICD-10-CM | POA: Diagnosis not present

## 2015-07-17 DIAGNOSIS — R1310 Dysphagia, unspecified: Secondary | ICD-10-CM

## 2015-07-17 DIAGNOSIS — N400 Enlarged prostate without lower urinary tract symptoms: Secondary | ICD-10-CM | POA: Diagnosis not present

## 2015-07-17 DIAGNOSIS — Z0000 Encounter for general adult medical examination without abnormal findings: Secondary | ICD-10-CM | POA: Diagnosis not present

## 2015-07-17 DIAGNOSIS — G5793 Unspecified mononeuropathy of bilateral lower limbs: Secondary | ICD-10-CM | POA: Diagnosis not present

## 2015-07-17 NOTE — Progress Notes (Signed)
Subjective:    Justin Mcgee is a 80 y.o. male who presents for Medicare Annual/Subsequent preventive examination.   Preventive Screening-Counseling & Management  Tobacco History  Smoking status  . Former Smoker  . Quit date: 02/19/1978  Smokeless tobacco  . Not on file    Problems Prior to Visit 1. Having some leakage in the urine.  Feels like the stream is good but does have to stop and start some.  He does feel like his bladder well at times. In fact it difficult to posterior in urination sometimes. He has a known history of enlarged prostate gland. He's not currently taking any medications for this. 2. GERD-he does take Nexium daily. Hemoglobin on the news that may not be safe for him to do so.  He says if he misses his medication it typically triggers a cough but he doesn't really have heartburn symptoms per se. 3. He also reports that he is having some difficulty swallowing pills. This started about a month ago. His wife has also told him that she feels like his speech is changed. 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 The he never received radiation or chemotherapy. Surgery was in 2004. 4. He also complains of numbness in his feet. 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 He had a full workup with neurologist. He says now he started to notice some numbness in his toes on the right and is concerned.  Current Problems (verified) Patient Active Problem List   Diagnosis Date Noted  . 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 Primary osteoarthritis of both knees 02/02/2014  . IFG (impaired fasting glucose) 04/19/2013  . GERD (gastroesophageal reflux disease) 03/22/2013  . History of squamous cell carcinoma of tongue 03/22/2013  . History of basal cell carcinoma of skin 03/22/2013  . BPH (benign prostatic hyperplasia)  03/22/2013  . Former smoker     Medications Prior to Visit Current Outpatient Prescriptions on File Prior to Visit  Medication Sig Dispense Refill  . betamethasone dipropionate (DIPROLENE) 0.05 % cream Apply topically daily. 45 g 1  . Cholecalciferol (VITAMIN D3) 2000 UNITS TABS Take 1 tablet by mouth daily.    . Cyanocobalamin (B-12) 1000 MCG CAPS Take 1 tablet by mouth daily.    . Multiple Vitamin (MULTIVITAMIN) capsule Take 1 capsule by mouth daily.    [name] NEXIUM 40 MG capsule TAKE 1 CAPSULE DAILY AT NOON 90 capsule 1  . Saw Palmetto 450 MG CAPS Take 1 capsule by mouth daily.     No current facility-administered medications on file prior to visit.    Current Medications (verified) Current Outpatient Prescriptions  Medication Sig Dispense Refill  . betamethasone dipropionate (DIPROLENE) 0.05 % cream Apply topically daily. 45 g 1  . Cholecalciferol (VITAMIN D3) 2000 UNITS TABS Take 1 tablet by mouth daily.    . Cyanocobalamin (B-12) 1000 MCG CAPS Take 1 tablet by mouth daily.    . Multiple Vitamin (MULTIVITAMIN) capsule Take 1 capsule by mouth daily.    [name] NEXIUM 40 MG capsule TAKE 1 CAPSULE DAILY AT NOON 90 capsule 1  . Saw Palmetto 450 MG CAPS Take 1 capsule by mouth daily.    [name] VITAMIN E PO Take by mouth.     No current facility-administered medications for this visit.     Allergies (verified) Other and Sulfa  antibiotics   PAST HISTORY  Family History Family History  Problem Relation Age of Onset  . GER disease Father   . Other Father     Gilliam-Barre  . Diabetes      Social History Social History  Substance Use Topics  . Smoking status: Former Smoker    Quit date: 02/19/1978  . Smokeless tobacco: Not on file  . Alcohol Use: No    Are there smokers in your home (other than you)?  No  Risk Factors Current exercise habits: works out 3 days per week.   Dietary issues discussed: None   Cardiac risk factors: advanced age (older than 88 for men, 28 for  women).  Depression Screen (Note: if answer to either of the following is "Yes", a more complete depression screening is indicated)   Q1: Over the past two weeks, have you felt down, depressed or hopeless? No  Q2: Over the past two weeks, have you felt little interest or pleasure in doing things? No  Have you lost interest or pleasure in daily life? No  Do you often feel hopeless? No  Do you cry easily over simple problems? No  Activities of Daily Living In your present state of health, do you have any difficulty performing the following activities?:  Driving? No Managing money?  No Feeding yourself? No Getting from bed to chair? No Climbing a flight of stairs? No Preparing food and eating?: No Bathing or showering? No Getting dressed: No Getting to the toilet? No Using the toilet:No Moving around from place to place: No In the past year have you fallen or had a near fall?:No   Hearing Difficulties: Yes Do you often ask people to speak up or repeat themselves? Yes Do you experience ringing or noises in your ears? No Do you have difficulty understanding soft or whispered voices? Yes   Do you feel that you have a problem with memory? No  Do you often misplace items? No  Do you feel safe at home?  No  Cognitive Testing  Alert? Yes  Normal Appearance?Yes  Oriented to person? Yes  Place? Yes   Time? Yes  Recall of three objects?  Yes  Can perform simple calculations? Yes  Displays appropriate judgment?Yes  Can read the correct time from a watch face?Yes   Advanced Directives have been discussed with the patient? Yes   List the Names of Other Physician/Practitioners you currently use: 1.    Indicate any recent Medical Services you may have received from other than [facility] providers in the past year (date may be approximate).  Immunization History  Administered Date(s) Administered  . Influenza,inj,Quad PF,36+ Mos 07/17/2015  . Pneumococcal-Unspecified 06/04/2009  . Td  10/13/2007    Screening Tests Health Maintenance  Topic Date Due  . ZOSTAVAX  12/27/1996  . PNA vac Low Risk Adult (2 of 2 - PCV13) 06/04/2010  . INFLUENZA VACCINE  05/13/2015  . TETANUS/TDAP  10/12/2017    All answers were reviewed with the patient and necessary referrals were made:  Lydia Meng, MD   07/17/2015   History reviewed: allergies, current medications, past family history, past medical history, past social history, past surgical history and problem list  Review of Systems Pertinent items noted in HPI and remainder of comprehensive ROS otherwise negative.    Objective:     Vision by Snellen chart:  Last eye exam was a month ago.  Blood pressure 119/53, pulse 58, temperature 97.7 F (36.5 C), weight 199 lb (90.266 kg).  Body mass index is 24.87 kg/(m^2).  BP 119/53 mmHg  Pulse 58  Temp(Src) 97.7 F (36.5 C)  Wt 199 lb (90.266 kg) General appearance: alert, cooperative and appears stated age Head: Normocephalic, without obvious abnormality, atraumatic Eyes: conj clear, EOMi PEERLA Ears: normal TM's and external ear canals both ears Nose: Nares normal. Septum midline. Mucosa normal. No drainage or sinus tenderness. Throat: lips, mucosa, and tongue normal; teeth and gums normal Neck: no adenopathy, no carotid bruit, no JVD, supple, symmetrical, trachea midline and thyroid not enlarged, symmetric, no tenderness/mass/nodules Back: symmetric, no curvature. ROM normal. No CVA tenderness. Lungs: clear to auscultation bilaterally Chest wall: no tenderness Heart: regular rate and rhythm, S1, S2 normal, no murmur, click, rub or gallop Abdomen: soft, non-tender; bowel sounds normal; no masses,  no organomegaly Rectal: Prostate is enlarged bilaterally. No nodules or asymmetry. No induration. Extremities: extremities normal, atraumatic, no cyanosis or edema, large varicose veins.   Pulses: 2+ and symmetric Skin: Skin color, texture, turgor normal. No rashes or  lesions Lymph nodes: Cervical adenopathy: nl and Axillary adenopathy: nl Neurologic: Alert and oriented X 3, normal strength and tone. Normal symmetric reflexes. Normal coordination and gait     Assessment:     Medicare Wellness Exam       Plan:     During the course of the visit the patient was educated and counseled about appropriate screening and preventive services including:    Influenza vaccine - given today.   Prevnar 13 given today  BPH- AUA score of 13 today which is considered moderate. He rates his quality of life as level III, which is mixed. We discussed options such as treatment with oral medications. He declines at this point in time. Will check PSA.    Numbness on bilateral feet - will evaluate for neuropathy, though could be from his back as well.  Also consider eval for PVD.    Hypertriglyceridemia- due to recheck lipids.    GERD-we discussed the potential side effects of long-term use of PPIs in general. I did encourage him to try to wean them down. If he can decrease to every other day successfully for several months and can then go down to every third day. We also discussed that we can consider stepdown therapy if he's not able to wean completely off of the Nexium. Can switch to an H2 blocker.  Difficulty swallowing-he feels like it's mostly in the throat area. I would like to refer him to GI for further evaluation and possible endoscopy. He lives closer to [facility] [place] location.  Hypertriglyeridemia - check TG.   Diet review for nutrition referral? Yes ____  Not Indicated _X__  Patient Instructions (the written plan) was given to the patient.  Medicare Attestation I have personally reviewed: The patient's medical and social history Their use of alcohol, tobacco or illicit drugs Their current medications and supplements The patient's functional ability including ADLs,fall risks, home safety risks, cognitive, and hearing and visual  impairment Diet and physical activities Evidence for depression or mood disorders  The patient's weight, height, BMI, and visual acuity have been recorded in the chart.  I have made referrals, counseling, and provided education to the patient based on review of the above and I have provided the patient with a written personalized care plan for preventive services.     Damarri Rampy, MD   07/17/2015

## 2015-07-19 ENCOUNTER — Ambulatory Visit (INDEPENDENT_AMBULATORY_CARE_PROVIDER_SITE_OTHER): Payer: Medicare Other | Admitting: Gastroenterology

## 2015-07-19 ENCOUNTER — Encounter: Payer: Self-pay | Admitting: Gastroenterology

## 2015-07-19 VITALS — BP 124/64 | HR 70 | Ht 75.0 in | Wt 201.0 lb

## 2015-07-19 DIAGNOSIS — R1310 Dysphagia, unspecified: Secondary | ICD-10-CM

## 2015-07-19 NOTE — Patient Instructions (Addendum)
You have been scheduled to see Dr Janace Hoard at [facility] on 07/29/15 @ 2:30 pm. Please arrive at 2:10 pm.  Please call after that ENT visit.

## 2015-07-19 NOTE — Progress Notes (Signed)
HPI: This is a  very pleasant 80 year old man    who was referred to me by Hali Marry, *  to evaluate  dysphasia, hoarseness .    Chief complaint is dysphagia, throat irritation  Swallowing difficulty for about a month.  Solid food only, catching in throat.  Drinks liquids and that helps.  Dysphagia is about daily.  He's lost some weight, retired 2 months ago.  He had similar issues when diagnosed with tongue cancer 12 years ago.  He had surgery, needed no chemo or xrt.  He had GERD, pyrosis.  This is well controlled on proton pump inhibitor once daily  Slight throat irritation. He said this is similar but not as severe as when he was diagnosed with tongue cancer several years ago  Review of systems: Pertinent positive and negative review of systems were noted in the above HPI section. Complete review of systems was performed and was otherwise normal.   Past Medical History  Diagnosis Date  . 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 Marica Otter, no radiation or chemo, clear LN.   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 Fracture of right radius 1956  . Fracture, metacarpal 1956    1, 2, 3  . Fracture, tibia 1964  . Former smoker     Past Surgical History  Procedure Laterality Date  . Cholecystectomy  05/16/10    in [place] while on vacation  . Appendectomy  1997  . Inguinal hernia repair Right 1965, 1968  . Inguinal hernia repair Left 1994  . Partial glossectomy      with unilat neck dissection  . Cataract extraction      Current Outpatient Prescriptions  Medication Sig Dispense Refill  . betamethasone dipropionate (DIPROLENE) 0.05 % cream Apply topically daily. 45 g 1  . Cholecalciferol (VITAMIN D3) 2000 UNITS TABS Take 1 tablet by mouth daily.    . Cyanocobalamin (B-12) 1000 MCG CAPS Take 1 tablet by mouth daily.    . Multiple Vitamin (MULTIVITAMIN) capsule Take 1 capsule by mouth daily.    [name] NEXIUM 40 MG capsule TAKE 1  CAPSULE DAILY AT NOON 90 capsule 1  . Saw Palmetto 450 MG CAPS Take 1 capsule by mouth daily.    [name] VITAMIN E PO Take by mouth.     No current facility-administered medications for this visit.    Allergies as of 07/19/2015 - Review Complete 07/19/2015  Allergen Reaction Noted  . Other Other (See Comments) 03/22/2013  . Sulfa antibiotics  07/17/2015    Family History  Problem Relation Age of Onset  . GER disease Father   . Other Father     Gilliam-Barre  . Diabetes      Social History   Social History  . Marital Status: Married    Spouse Name: Inigo Lantigua   . Number of Children: 2  . Years of Education: N/A   Occupational History  . [employer] manager [employer]   Social History Main Topics  . Smoking status: Former Smoker    Quit date: 02/19/1978  . Smokeless tobacco: Not on file  . Alcohol Use: No  . Drug Use: No  . Sexual Activity:    Partners: Female   Other Topics Concern  . Not on file   Social History Narrative   4 caffeine drink per day.  40 min cardio/strength 6 days per week. Retired from [employer].  Physical Exam: BP 124/64 mmHg  Pulse 70  Ht 6\' 3"  (1.905 m)  Wt 201 lb (91.173 kg)  BMI 25.12 kg/m2 Constitutional: generally well-appearing Psychiatric: alert and oriented x3 Eyes: extraocular movements intact Mouth: Slight deformity left side of tongue no obvious masses, Neck: supple no lymphadenopathy, obvious left neck surgery Cardiovascular: heart regular rate and rhythm Lungs: clear to auscultation bilaterally Abdomen: soft, nontender, nondistended, no obvious ascites, no peritoneal signs, normal bowel sounds Extremities: no lower extremity edema bilaterally Skin: no lesions on visible extremities   Assessment and plan: 80 y.o. male with  dysphasia, throat irritation, history of squamous base of  tongue cancer  He has solid food dysphagia that he says is located in his throat, he has some throat irritation. Both of  these symptoms are very similar to him as when he had, was diagnosed with base of tongue squamous cell cancer which required tongue surgery with left neck node dissection. I do not see any obvious lesions in his mouth or throat with flashlight exam at the bedside however I would like him to first visit with his previous ENT doctor for more thorough ENT evaluation given that history. I asked him to call here after that visit. If Dr. Janace Hoard does not feel there is concern for recurrence of the squamous cell cancer then my recommendation would be to proceed with EGD at his soonest convenience.   Owens Loffler, MD [facility] 07/19/2015, 1:54 PM  Cc: Hali Marry, *

## 2015-07-22 DIAGNOSIS — E781 Pure hyperglyceridemia: Secondary | ICD-10-CM | POA: Diagnosis not present

## 2015-07-22 DIAGNOSIS — N400 Enlarged prostate without lower urinary tract symptoms: Secondary | ICD-10-CM | POA: Diagnosis not present

## 2015-07-22 DIAGNOSIS — G5793 Unspecified mononeuropathy of bilateral lower limbs: Secondary | ICD-10-CM | POA: Diagnosis not present

## 2015-07-22 DIAGNOSIS — G609 Hereditary and idiopathic neuropathy, unspecified: Secondary | ICD-10-CM | POA: Diagnosis not present

## 2015-07-22 DIAGNOSIS — R202 Paresthesia of skin: Secondary | ICD-10-CM | POA: Diagnosis not present

## 2015-07-22 DIAGNOSIS — R209 Unspecified disturbances of skin sensation: Secondary | ICD-10-CM | POA: Diagnosis not present

## 2015-07-23 ENCOUNTER — Telehealth: Payer: Self-pay

## 2015-07-23 LAB — LIPID PANEL
Cholesterol: 182 mg/dL (ref 125–200)
HDL: 35 mg/dL — ABNORMAL LOW (ref >=40)
LDL Cholesterol: 111 mg/dL (ref <130)
TRIGLYCERIDES: 180 mg/dL — AB (ref <150)
Total CHOL/HDL Ratio: 5.2 Ratio — ABNORMAL HIGH (ref <=5.0)
VLDL: 36 mg/dL — ABNORMAL HIGH (ref <30)

## 2015-07-23 LAB — COMPLETE METABOLIC PANEL WITH GFR
ALBUMIN: 4.4 g/dL (ref 3.6–5.1)
ALT: 13 U/L (ref 9–46)
AST: 17 U/L (ref 10–35)
Alkaline Phosphatase: 71 U/L (ref 40–115)
BILIRUBIN TOTAL: 0.6 mg/dL (ref 0.2–1.2)
BUN: 21 mg/dL (ref 7–25)
CO2: 32 mmol/L — ABNORMAL HIGH (ref 20–31)
CREATININE: 0.99 mg/dL (ref 0.70–1.18)
Calcium: 9.5 mg/dL (ref 8.6–10.3)
Chloride: 105 mmol/L (ref 98–110)
GFR, EST AFRICAN AMERICAN: 84 mL/min (ref >=60)
GFR, Est Non African American: 73 mL/min (ref >=60)
Glucose, Bld: 114 mg/dL — ABNORMAL HIGH (ref 65–99)
Potassium: 4.2 mmol/L (ref 3.5–5.3)
Sodium: 142 mmol/L (ref 135–146)
TOTAL PROTEIN: 7 g/dL (ref 6.1–8.1)

## 2015-07-23 LAB — CBC
HCT: 41.6 % (ref 39.0–52.0)
Hemoglobin: 14.3 g/dL (ref 13.0–17.0)
MCH: 29.9 pg (ref 26.0–34.0)
MCHC: 34.4 g/dL (ref 30.0–36.0)
MCV: 86.8 fL (ref 78.0–100.0)
MPV: 9.9 fL (ref 8.6–12.4)
PLATELETS: 162 10*3/uL (ref 150–400)
RBC: 4.79 MIL/uL (ref 4.22–5.81)
RDW: 12.7 % (ref 11.5–15.5)
WBC: 5 10*3/uL (ref 4.0–10.5)

## 2015-07-23 LAB — MAGNESIUM: MAGNESIUM: 2.1 mg/dL (ref 1.5–2.5)

## 2015-07-23 LAB — TSH: TSH: 1.489 u[IU]/mL (ref 0.350–4.500)

## 2015-07-23 LAB — PSA: PSA: 6.09 ng/mL — ABNORMAL HIGH (ref <=4.00)

## 2015-07-23 LAB — VITAMIN B12: Vitamin B-12: 1179 pg/mL — ABNORMAL HIGH (ref 211–911)

## 2015-07-23 NOTE — Telephone Encounter (Signed)
Notified of results. No questions or concerns at this time. Pt states that he would like to hold off on the nerve conduction test on his LEFT leg at this time

## 2015-07-23 NOTE — Telephone Encounter (Signed)
-----   Message from Hali Marry, MD sent at 07/23/2015  7:54 AM EDT ----- Call pt: thyroid is normal B12 is normal. In fact B12 levels are little bit high so if he is taking a supplement he may need to stop this for a little while. Metabolic panel including liver and kidney function is normal. We could move forward with a nerve conduction study of his right leg to see if the numbness is coming from damage to the nerve. Certainly he can think about it. I see where GI saw him recently.

## 2015-07-29 DIAGNOSIS — J387 Other diseases of larynx: Secondary | ICD-10-CM | POA: Diagnosis not present

## 2015-08-01 ENCOUNTER — Telehealth: Payer: Self-pay | Admitting: Gastroenterology

## 2015-08-01 NOTE — Telephone Encounter (Signed)
Pt states he was seen by Dr. Barry Dienes and told there was nothing wrong with his throat, that he may have acid reflux. States he was given some medication for reflux and to follow up with Dr. Barry Dienes in a month. Dr. Ardis Hughs notified.

## 2015-08-01 NOTE — Telephone Encounter (Signed)
Ok, he will need EGD at his soonest convenience then

## 2015-08-06 NOTE — Telephone Encounter (Signed)
Pt has been scheduled for an EGD and previsit, he is aware

## 2015-08-08 ENCOUNTER — Ambulatory Visit (INDEPENDENT_AMBULATORY_CARE_PROVIDER_SITE_OTHER): Payer: Medicare Other | Admitting: Family Medicine

## 2015-08-08 ENCOUNTER — Encounter: Payer: Self-pay | Admitting: Family Medicine

## 2015-08-08 VITALS — BP 131/82 | HR 67 | Temp 98.8°F | Resp 16 | Wt 198.9 lb

## 2015-08-08 DIAGNOSIS — R7301 Impaired fasting glucose: Secondary | ICD-10-CM

## 2015-08-08 DIAGNOSIS — M5417 Radiculopathy, lumbosacral region: Secondary | ICD-10-CM | POA: Diagnosis not present

## 2015-08-08 DIAGNOSIS — M79672 Pain in left foot: Secondary | ICD-10-CM

## 2015-08-08 MED ORDER — GABAPENTIN 100 MG PO CAPS
ORAL_CAPSULE | ORAL | Status: DC
Start: 2015-08-08 — End: 2015-09-11

## 2015-08-08 NOTE — Progress Notes (Signed)
   Subjective:    Patient ID: Justin Mcgee, male    DOB: [birth year], 80 y.o.   MRN: [id number]  HPI Pain in the left foot for several years.  Had a foot xray that was normal about 8 years ago .  Then had xrays of the low spine and was told the foot pain is from a "pinched nerve in his back". He was dx by his PCP. Never saw an ortho or specialist.  Had a nerve conduction study done about 8 years as well.   He has had pain over the years. More recently he decided to come in because it's affecting his sleep. Foot pain gets worse at the end of the day. He says when he lays down to go to sleep at night is when it bothers him the most. Pain is mostly on the top and ball of the foot. He does feel like he has some slightly decreased sensation in his toes. He feels like there is a burning sensation on the top of his foot and more of a soreness on the ball of his foot.     Review of Systems     Objective:   Physical Exam  Constitutional: He is oriented to person, place, and time. He appears well-developed.  HENT:  Head: Normocephalic and atraumatic.  Musculoskeletal:  Left foot  With no redenss or lesion. DP and post tib pulses are 2+. No swelling, bruising, or edema. He does have some varicose veins over the foot in the ankle. No swelling around the ankle itself. Normal range of motion of the ankle. Normal flexion extension of the toes. He does have onychomycotic toenails on all 5 digits. He does have a little bit of tenderness behind the third and fourth metatarsal heads. No pain with compression of the metatarsal heads themselves or with moving the back and forth. No sign of neuroma. He does have little bit of splaying between the first and second toe when he stands. He has a moderate sized arch.  Neurological: He is alert and oriented to person, place, and time.  Skin: Skin is warm and dry.  Psychiatric: He has a normal mood and affect. His behavior is normal.          Assessment & Plan:   Left foot pain with radicular neuropathy- discussed diagnosis. I really think he has 2 components going on here. I do think he has some neuropathy which was previously diagnosis he does get a burning sensation on the top of the foot. We discussed a trial of gabapentin to help reduce pain and how this works. He says he would like to consider trying that. We also discussed that I think he has some component of musculoskeletal/mechanical pain. Recommend a trial of metatarsal pads to see if this provides some comfort and relief over the next few weeks. If it doesn't he can go online and order additional pairs to be placed in his other shoe wear. Also discussed that he may benefit from custom orthotics or possibly even seeing one of our sports medicine doctors if his symptoms are not improving.  IFG - reviewed his recent lab work with him. His last  A1c was 5.8 in July 2015. Due to repeat that today.

## 2015-08-12 ENCOUNTER — Ambulatory Visit (AMBULATORY_SURGERY_CENTER): Payer: Self-pay

## 2015-08-12 VITALS — Ht 75.0 in | Wt 201.0 lb

## 2015-08-12 DIAGNOSIS — R1310 Dysphagia, unspecified: Secondary | ICD-10-CM

## 2015-08-12 NOTE — Progress Notes (Signed)
No allergies to eggs or soy No diet/weight loss meds No home oxygen No past problems with anesthesia  Has email registered emmi instructions

## 2015-08-16 ENCOUNTER — Encounter: Payer: Self-pay | Admitting: Gastroenterology

## 2015-08-16 ENCOUNTER — Ambulatory Visit (AMBULATORY_SURGERY_CENTER): Payer: Medicare Other | Admitting: Gastroenterology

## 2015-08-16 VITALS — BP 105/58 | HR 63 | Temp 97.4°F | Resp 37 | Ht 75.0 in | Wt 201.0 lb

## 2015-08-16 DIAGNOSIS — R1310 Dysphagia, unspecified: Secondary | ICD-10-CM | POA: Diagnosis not present

## 2015-08-16 DIAGNOSIS — K219 Gastro-esophageal reflux disease without esophagitis: Secondary | ICD-10-CM | POA: Diagnosis not present

## 2015-08-16 MED ORDER — SODIUM CHLORIDE 0.9 % IV SOLN: 500.0000 mL | INTRAVENOUS | Status: DC

## 2015-08-16 NOTE — Progress Notes (Signed)
To recovery, Report to Tamala Julian, RN, VSS.

## 2015-08-16 NOTE — Patient Instructions (Signed)
YOU HAD AN ENDOSCOPIC PROCEDURE TODAY AT THE [facility]:   Refer to the procedure report that was given to you for any specific questions about what was found during the examination.  If the procedure report does not answer your questions, please call your gastroenterologist to clarify.  If you requested that your care partner not be given the details of your procedure findings, then the procedure report has been included in a sealed envelope for you to review at your convenience later.  YOU SHOULD EXPECT: Some feelings of bloating in the abdomen. Passage of more gas than usual.  Walking can help get rid of the air that was put into your GI tract during the procedure and reduce the bloating. If you had a lower endoscopy (such as a colonoscopy or flexible sigmoidoscopy) you may notice spotting of blood in your stool or on the toilet paper. If you underwent a bowel prep for your procedure, you may not have a normal bowel movement for a few days.  Please Note:  You might notice some irritation and congestion in your nose or some drainage.  This is from the oxygen used during your procedure.  There is no need for concern and it should clear up in a day or so.  SYMPTOMS TO REPORT IMMEDIATELY:    Following upper endoscopy (EGD)  Vomiting of blood or coffee ground material  New chest pain or pain under the shoulder blades  Painful or persistently difficult swallowing  New shortness of breath  Fever of 100F or higher  Black, tarry-looking stools  For urgent or emergent issues, a gastroenterologist can be reached at any hour by calling [phone].   DIET: Your first meal following the procedure should be a small meal and then it is ok to progress to your normal diet. Heavy or fried foods are harder to digest and may make you feel nauseous or bloated.  Likewise, meals heavy in dairy and vegetables can increase bloating.  Drink plenty of fluids but you should avoid alcoholic beverages  for 24 hours.  ACTIVITY:  You should plan to take it easy for the rest of today and you should NOT DRIVE or use heavy machinery until tomorrow (because of the sedation medicines used during the test).    FOLLOW UP: Our staff will call the number listed on your records the next business day following your procedure to check on you and address any questions or concerns that you may have regarding the information given to you following your procedure. If we do not reach you, we will leave a message.  However, if you are feeling well and you are not experiencing any problems, there is no need to return our call.  We will assume that you have returned to your regular daily activities without incident.  If any biopsies were taken you will be contacted by phone or by letter within the next 1-3 weeks.  Please call us at [phone] if you have not heard about the biopsies in 3 weeks.    SIGNATURES/CONFIDENTIALITY: You and/or your care partner have signed paperwork which will be entered into your electronic medical record.  These signatures attest to the fact that that the information above on your After Visit Summary has been reviewed and is understood.  Full responsibility of the confidentiality of this discharge information lies with you and/or your care-partner. 

## 2015-08-19 ENCOUNTER — Telehealth: Payer: Self-pay

## 2015-08-19 NOTE — Telephone Encounter (Signed)
  Follow up Call-  Call back number 08/16/2015  Post procedure Call Back phone  # [phone]  Permission to leave phone message Yes     Patient questions:  Do you have a fever, pain , or abdominal swelling? No. Pain Score  0 *  Have you tolerated food without any problems? Yes.    Have you been able to return to your normal activities? Yes.    Do you have any questions about your discharge instructions: Diet   No. Medications  No. Follow up visit  No.  Do you have questions or concerns about your Care? No.  Actions: * If pain score is 4 or above: No action needed, pain <4.  No problems per the pt. maw

## 2015-08-19 NOTE — Op Note (Signed)
[place]  [address]. [place], [id number]   ENDOSCOPY PROCEDURE REPORT  PATIENT: Justin Mcgee, Justin Mcgee  MR#: [id number] BIRTHDATE: Jul 18, 1935 , 89  yrs. old GENDER: male ENDOSCOPIST: Milus Banister, MD REFERRED BY:  Beatrice Lecher, M.D. PROCEDURE DATE:  08/16/2015 PROCEDURE:  EGD, diagnostic ASA CLASS:     Class III INDICATIONS:  dysphagia, remote head/neck cancer treated with surgery only; re-evaluation by ENT foun no sign of recurrence. MEDICATIONS: Monitored anesthesia care and Propofol 140 mg IV TOPICAL ANESTHETIC: none  DESCRIPTION OF PROCEDURE: After the risks benefits and alternatives of the procedure were thoroughly explained, informed consent was obtained.  The LB [id number] endoscope was introduced through the mouth and advanced to the second portion of the duodenum , Without limitations.  The instrument was slowly withdrawn as the mucosa was fully examined.  There was a 2cm hiatal hernia.  The esophagus was more tortuous than normal, but was otherwise normal.  GE junction was normal. Retroflexed views revealed no abnormalities.     The scope was then withdrawn from the patient and the procedure completed. COMPLICATIONS: There were no immediate complications.  ENDOSCOPIC IMPRESSION: There was a 2cm hiatal hernia.  The esophagus was more tortuous than normal, but was otherwise normal.  GE junction was normal  RECOMMENDATIONS: My office will help set up modified barium swallow study with speech therapy to check for oropharyngeal source of the swallowing trouble.  For now continue to chew your food well, eat slowly and take small bites.   eSigned:  Milus Banister, MD 08/16/2015 11:05 AM

## 2015-08-30 DIAGNOSIS — J387 Other diseases of larynx: Secondary | ICD-10-CM | POA: Diagnosis not present

## 2015-09-11 ENCOUNTER — Other Ambulatory Visit: Payer: Self-pay | Admitting: Family Medicine

## 2015-09-11 MED ORDER — ESOMEPRAZOLE MAGNESIUM 40 MG PO CPDR: DELAYED_RELEASE_CAPSULE | ORAL | Status: DC

## 2015-09-11 MED ORDER — GABAPENTIN 100 MG PO CAPS: 200.0000 mg | ORAL_CAPSULE | Freq: Every day | ORAL | Status: DC

## 2015-09-30 ENCOUNTER — Other Ambulatory Visit: Payer: Self-pay | Admitting: Family Medicine

## 2015-09-30 MED ORDER — ESOMEPRAZOLE MAGNESIUM 40 MG PO CPDR: DELAYED_RELEASE_CAPSULE | ORAL | Status: DC

## 2015-09-30 MED ORDER — GABAPENTIN 100 MG PO CAPS: 200.0000 mg | ORAL_CAPSULE | Freq: Every day | ORAL | Status: DC

## 2015-10-01 ENCOUNTER — Telehealth: Payer: Self-pay

## 2015-10-01 NOTE — Telephone Encounter (Signed)
Ok ot send. Make sure a 90 day supply

## 2015-10-01 NOTE — Telephone Encounter (Signed)
[facility] called and needs script for Neurontin sent to them to fill for the patient. [facility] [address] [place], [place], [place]

## 2015-10-02 MED ORDER — GABAPENTIN 100 MG PO CAPS: 200.0000 mg | ORAL_CAPSULE | Freq: Every day | ORAL | Status: DC

## 2015-10-02 NOTE — Telephone Encounter (Signed)
Sent to pharmacy 

## 2015-10-03 MED ORDER — ESOMEPRAZOLE MAGNESIUM 40 MG PO CPDR: DELAYED_RELEASE_CAPSULE | ORAL | Status: DC

## 2015-10-03 NOTE — Addendum Note (Signed)
Addended by: Huel Cote on: 10/03/2015 03:48 PM   Modules accepted: Orders

## 2016-02-06 ENCOUNTER — Ambulatory Visit (INDEPENDENT_AMBULATORY_CARE_PROVIDER_SITE_OTHER): Payer: Medicare Other | Admitting: Family Medicine

## 2016-02-06 ENCOUNTER — Encounter: Payer: Self-pay | Admitting: Family Medicine

## 2016-02-06 VITALS — BP 124/71 | HR 56 | Ht 75.0 in | Wt 197.0 lb

## 2016-02-06 DIAGNOSIS — R002 Palpitations: Secondary | ICD-10-CM

## 2016-02-06 DIAGNOSIS — R7301 Impaired fasting glucose: Secondary | ICD-10-CM

## 2016-02-06 DIAGNOSIS — H6192 Disorder of left external ear, unspecified: Secondary | ICD-10-CM

## 2016-02-06 DIAGNOSIS — N400 Enlarged prostate without lower urinary tract symptoms: Secondary | ICD-10-CM

## 2016-02-06 LAB — CBC WITH DIFFERENTIAL/PLATELET
BASOS ABS: 54 {cells}/uL (ref 0–200)
Basophils Relative: 1 %
EOS ABS: 162 {cells}/uL (ref 15–500)
Eosinophils Relative: 3 %
HEMATOCRIT: 41.7 % (ref 38.5–50.0)
HEMOGLOBIN: 14.2 g/dL (ref 13.2–17.1)
LYMPHS ABS: 1836 {cells}/uL (ref 850–3900)
Lymphocytes Relative: 34 %
MCH: 30.1 pg (ref 27.0–33.0)
MCHC: 34.1 g/dL (ref 32.0–36.0)
MCV: 88.5 fL (ref 80.0–100.0)
MONO ABS: 486 {cells}/uL (ref 200–950)
MPV: 9.9 fL (ref 7.5–12.5)
Monocytes Relative: 9 %
NEUTROS ABS: 2862 {cells}/uL (ref 1500–7800)
NEUTROS PCT: 53 %
Platelets: 154 10*3/uL (ref 140–400)
RBC: 4.71 MIL/uL (ref 4.20–5.80)
RDW: 12.6 % (ref 11.0–15.0)
WBC: 5.4 10*3/uL (ref 3.8–10.8)

## 2016-02-06 LAB — TSH: TSH: 1.89 m[IU]/L (ref 0.40–4.50)

## 2016-02-06 LAB — COMPLETE METABOLIC PANEL WITH GFR
ALK PHOS: 68 U/L (ref 40–115)
ALT: 12 U/L (ref 9–46)
AST: 17 U/L (ref 10–35)
Albumin: 4.2 g/dL (ref 3.6–5.1)
BUN: 16 mg/dL (ref 7–25)
CO2: 27 mmol/L (ref 20–31)
Calcium: 9.3 mg/dL (ref 8.6–10.3)
Chloride: 104 mmol/L (ref 98–110)
Creat: 1 mg/dL (ref 0.70–1.18)
GFR, EST AFRICAN AMERICAN: 82 mL/min (ref >=60)
GFR, EST NON AFRICAN AMERICAN: 71 mL/min (ref >=60)
GLUCOSE: 107 mg/dL — AB (ref 65–99)
POTASSIUM: 4.3 mmol/L (ref 3.5–5.3)
Sodium: 141 mmol/L (ref 135–146)
Total Bilirubin: 0.4 mg/dL (ref 0.2–1.2)
Total Protein: 6.6 g/dL (ref 6.1–8.1)

## 2016-02-06 LAB — POCT GLYCOSYLATED HEMOGLOBIN (HGB A1C): Hemoglobin A1C: 5.8

## 2016-02-06 NOTE — Progress Notes (Signed)
Subjective:    Patient ID: Justin Mcgee, male    DOB: 05-21-1935, 81 y.o.   MRN: [id number]  HPI IFG - No increased thirst or urination.   He did start doing some cardio exercise after I saw him last time. He said when he would really increase his cardiac patient's heart rate up into the 120s he would start to notice some PVCs. Asked him if he could feel them in his chest or if that was just with checking his pulse. He said he noticed it when he was checking his pulse. He did not experience any chest pain or discomfort. He is not expressing palpitations outside of exercise. He denies any swelling of the extremities. He has never had a cardiac stress test. No family history of arrhythmia or heart disease.  BPH- he is taking saw palmetto.  Lab Results  Component Value Date   PSA 6.09* 07/22/2015   PSA 5.90* 01/18/2014   PSA 5.96* 10/20/2013     He also wants me to look at a spot on his left ear. He actually had a skin cancer removed there and ended up developing a sebaceous cyst right next to it. That eventually resolved the left little scar tissue. He just wants me that he got today and make sure it looks okay.   Review of Systems   BP 124/71 mmHg  Pulse 56  Ht 6\' 3"  (1.905 m)  Wt 197 lb (89.359 kg)  BMI 24.62 kg/m2  SpO2 97%    Allergies  Allergen Reactions  . Other Other (See Comments)    Topical sulfa only. Gets skin irritation.  . Sulfa Antibiotics     Past Medical History  Diagnosis Date  . 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 Marica Otter, no radiation or chemo, clear LN.   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 Fracture of right radius 1956  . Fracture, metacarpal 1956    1, 2, 3  . Fracture, tibia 1964  . Former smoker     Past Surgical History  Procedure Laterality Date  . Cholecystectomy  05/16/10    in [place] while on vacation  . Appendectomy  1997  . Inguinal hernia repair Right 1965, 1968  . Inguinal  hernia repair Left 1994  . Partial glossectomy      with unilat neck dissection  . Cataract extraction      Social History   Social History  . Marital Status: Married    Spouse Name: Justin Mcgee   . Number of Children: 2  . Years of Education: N/A   Occupational History  . [employer] manager [employer]   Social History Main Topics  . Smoking status: Former Smoker    Quit date: 02/19/1978  . Smokeless tobacco: Never Used  . Alcohol Use: No  . Drug Use: No  . Sexual Activity:    Partners: Female   Other Topics Concern  . Not on file   Social History Narrative   4 caffeine drink per day.  40 min cardio/strength 6 days per week. Retired from [employer].              Family History  Problem Relation Age of Onset  . GER disease Father   . Other Father     Gilliam-Barre  . Diabetes    . Colon cancer Neg Hx     Outpatient Encounter Prescriptions as  of 02/06/2016  Medication Sig  . Cholecalciferol (VITAMIN D3) 2000 UNITS TABS Take 1 tablet by mouth daily.  [name] esomeprazole (NEXIUM) 40 MG capsule TAKE 1 CAPSULE PO TWICE A DAY  . gabapentin (NEURONTIN) 100 MG capsule Take 2 capsules (200 mg total) by mouth at bedtime.  . Multiple Vitamin (MULTIVITAMIN) capsule Take 1 capsule by mouth daily.  . Omega-3 Fatty Acids (FISH OIL) 1000 MG CAPS Take 1 capsule by mouth daily.  . Saw Palmetto 450 MG CAPS Take 1 capsule by mouth daily.  [name] VITAMIN E PO Take by mouth.   No facility-administered encounter medications on file as of 02/06/2016.          Objective:   Physical Exam  Constitutional: He is oriented to person, place, and time. He appears well-developed and well-nourished.  HENT:  Head: Normocephalic and atraumatic.  Neck: Neck supple. No thyromegaly present.  Cardiovascular: Normal rate, regular rhythm and normal heart sounds.   No carotid bruits.  Pulmonary/Chest: Effort normal and breath sounds normal.  Musculoskeletal: He exhibits no edema.   Lymphadenopathy:    He has no cervical adenopathy.  Neurological: He is alert and oriented to person, place, and time.  Skin: Skin is warm and dry.  Psychiatric: He has a normal mood and affect. His behavior is normal.    Just behind the left ear near the crease he has a little bit of extra scar tissue. Nothing concerning for recurrent basal or squamous cell.      Assessment & Plan:  IFG - 11 A1c is 5.8 today which is stable from previous. Continue to work on diet and exercise and follow-up in 6 months.  Palpitations-EKG performed today. Also discussed possibly getting a treadmill stress test.EKG today shows rate of 55 bpm, normal sinus rhythm with normal axis and no acute ST-T wave changes. I would still like for him to get a stress test and he agrees to do so. Fortunately he has not experienced any chest pain with exercise which is reassuring. Now he will go back to just doing Pilates and walking instead of high intensity aerobic exercise.  BPH - Last PSA was mildly elevated at 6, 6 months ago. Currently is taking saw palmetto. His PSA has been fairly stable over the last 2 years.  Left ear lesion-most consistent with scar tissue. Nothing worrisome but I'll be happy to look at it periodically when he comes in.

## 2016-02-22 ENCOUNTER — Other Ambulatory Visit: Payer: Self-pay | Admitting: Family Medicine

## 2016-02-27 ENCOUNTER — Other Ambulatory Visit: Payer: Self-pay | Admitting: *Deleted

## 2016-02-27 MED ORDER — PANTOPRAZOLE SODIUM 40 MG PO TBEC: 40.0000 mg | DELAYED_RELEASE_TABLET | Freq: Two times a day (BID) | ORAL | Status: DC

## 2016-04-20 ENCOUNTER — Encounter: Payer: Self-pay | Admitting: *Deleted

## 2016-04-20 ENCOUNTER — Emergency Department (INDEPENDENT_AMBULATORY_CARE_PROVIDER_SITE_OTHER)
Admission: EM | Admit: 2016-04-20 | Discharge: 2016-04-20 | Disposition: A | Discharge status: Home / Self Care | Payer: Medicare Other | Source: Home / Self Care | Attending: Family Medicine | Admitting: Family Medicine

## 2016-04-20 DIAGNOSIS — L259 Unspecified contact dermatitis, unspecified cause: Secondary | ICD-10-CM | POA: Diagnosis not present

## 2016-04-20 MED ORDER — PREDNISONE 20 MG PO TABS: ORAL_TABLET | ORAL | Status: DC

## 2016-04-20 MED ORDER — DEXAMETHASONE SODIUM PHOSPHATE 10 MG/ML IJ SOLN
10.0000 mg | Freq: Once | INTRAMUSCULAR | Status: AC
  Administered 2016-04-20: 10 mg via INTRAMUSCULAR

## 2016-04-20 MED ORDER — CETIRIZINE HCL 5 MG PO TABS
5.0000 mg | ORAL_TABLET | Freq: Every day | ORAL | Status: DC
Start: 2016-04-20 — End: 2016-06-03

## 2016-04-20 NOTE — ED Notes (Signed)
Pt c/o pruritic rash to bilateeral forearms x 2 days. He performed yard work recently. Used cortisone cream otc.

## 2016-04-20 NOTE — ED Provider Notes (Signed)
CSN: [id number]     Arrival date & time 04/20/16  1621 History   First MD Initiated Contact with Patient 04/20/16 1624     Chief Complaint  Patient presents with  . Rash   (Consider location/radiation/quality/duration/timing/severity/associated sxs/prior Treatment) HPI  Justin Mcgee is a 81 y.o. male presenting to UC with c/o gradually worsening mild to moderately pruritic, erythematous rash on both forearms for 2 days after working in his yard about 2-3 days ago.  He has used OTC cortisone cream with only minimal relief. Denies prior reactions to poison ivy/oak but believes that's what caused his current rash. Denies fever, chills, oral swelling or SOB.    Past Medical History  Diagnosis Date  . 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 Marica Otter, no radiation or chemo, clear LN.   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 Fracture of right radius 1956  . Fracture, metacarpal 1956    1, 2, 3  . Fracture, tibia 1964  . Former smoker    Past Surgical History  Procedure Laterality Date  . Cholecystectomy  05/16/10    in [place] while on vacation  . Appendectomy  1997  . Inguinal hernia repair Right 1965, 1968  . Inguinal hernia repair Left 1994  . Partial glossectomy      with unilat neck dissection  . Cataract extraction     Family History  Problem Relation Age of Onset  . GER disease Father   . Other Father     Gilliam-Barre  . Diabetes    . Colon cancer Neg Hx    Social History  Substance Use Topics  . Smoking status: Former Smoker    Quit date: 02/19/1978  . Smokeless tobacco: Never Used  . Alcohol Use: No    Review of Systems  Constitutional: Negative for fever and chills.  Respiratory: Negative for shortness of breath and wheezing.   Gastrointestinal: Negative for nausea and vomiting.  Skin: Positive for rash. Negative for wound.    Allergies  Other and Sulfa antibiotics  Home Medications   Prior to Admission  medications   Medication Sig Start Date End Date Taking? Authorizing Provider  esomeprazole (NEXIUM) 20 MG capsule Take 20 mg by mouth daily at 12 noon.   Yes Historical Provider, MD  cetirizine (ZYRTEC) 5 MG tablet Take 1 tablet (5 mg total) by mouth daily. As needed for itching 04/20/16   Noland Fordyce, PA-C  Cholecalciferol (VITAMIN D3) 2000 UNITS TABS Take 1 tablet by mouth daily.    Historical Provider, MD  gabapentin (NEURONTIN) 100 MG capsule TAKE 2 CAPSULES AT BEDTIME 02/24/16   Hali Marry, MD  Multiple Vitamin (MULTIVITAMIN) capsule Take 1 capsule by mouth daily.    Historical Provider, MD  Omega-3 Fatty Acids (FISH OIL) 1000 MG CAPS Take 1 capsule by mouth daily.    Historical Provider, MD  predniSONE (DELTASONE) 20 MG tablet 3 tabs po day one, then 2 po daily x 4 days 04/20/16   Noland Fordyce, PA-C  Saw Palmetto 450 MG CAPS Take 1 capsule by mouth daily.    Historical Provider, MD  VITAMIN E PO Take by mouth.    Historical Provider, MD   Meds Ordered and Administered this Visit   Medications  dexamethasone (DECADRON) injection 10 mg (10 mg Intramuscular Given 04/20/16 1644)    BP 124/66 mmHg  Pulse 59  Temp(Src) 98.4 F (36.9 C) (Oral)  Ht 6\' 2"  (1.88 m)  Wt 195 lb (88.451 kg)  BMI 25.03 kg/m2  SpO2 94% No data found.   Physical Exam  Constitutional: He is oriented to person, place, and time. He appears well-developed and well-nourished.  HENT:  Head: Normocephalic and atraumatic.  Eyes: EOM are normal.  Neck: Normal range of motion.  Cardiovascular: Normal rate.   Pulmonary/Chest: Effort normal.  Musculoskeletal: Normal range of motion.  Neurological: He is alert and oriented to person, place, and time.  Skin: Skin is warm and dry. There is erythema.  Bilateral forearms: diffuser erythematous maculopapular rash with overlying vesicular lesions with scant amount of draining yellow discharge.  Psychiatric: He has a normal mood and affect. His behavior is  normal.  Nursing note and vitals reviewed.   ED Course  Procedures (including critical care time)  Labs Review Labs Reviewed - No data to display  Imaging Review No results found.    MDM   1. Contact dermatitis    Rash c/w contact dermatitis w/o evidence of underlying infection that this time.  Tx in UC: Decadron 10mg  IM  Rx: prednisone and cetirizine   Pt info packet on contact dermatitis provided. F/u in 1 week if not improving. Patient verbalized understanding and agreement with treatment plan.     Noland Fordyce, PA-C 04/20/16 1732

## 2016-04-20 NOTE — Discharge Instructions (Signed)
You were given a shot of decadron (a steroid) today to help with itching and swelling from a likely allergic reaction to plants.  You have been prescribed 5 days of prednisone, an oral steroid.  You may start this medication tomorrow with breakfast.     Contact Dermatitis Dermatitis is redness, soreness, and swelling (inflammation) of the skin. Contact dermatitis is a reaction to certain substances that touch the skin. You either touched something that irritated your skin, or you have allergies to something you touched.  HOME CARE  Skin Care  Moisturize your skin as needed.  Apply cool compresses to the affected areas.   Try taking a bath with:   Epsom salts. Follow the instructions on the package. You can get these at a pharmacy or grocery store.   Baking soda. Pour a small amount into the bath as told by your doctor.   Colloidal oatmeal. Follow the instructions on the package. You can get this at a pharmacy or grocery store.   Try applying baking soda paste to your skin. Stir water into baking soda until it looks like paste.  Do not scratch your skin.   Bathe less often.  Bathe in lukewarm water. Avoid using hot water.  Medicines  Take or apply over-the-counter and prescription medicines only as told by your doctor.   If you were prescribed an antibiotic medicine, take or apply your antibiotic as told by your doctor. Do not stop taking the antibiotic even if your condition starts to get better. General Instructions  Keep all follow-up visits as told by your doctor. This is important.   Avoid the substance that caused your reaction. If you do not know what caused it, keep a journal to try to track what caused it. Write down:   What you eat.   What cosmetic products you use.   What you drink.   What you wear in the affected area. This includes jewelry.   If you were given a bandage (dressing), take care of it as told by your doctor. This includes when to  change and remove it.  GET HELP IF:   You do not get better with treatment.   Your condition gets worse.   You have signs of infection such as:  Swelling.  Tenderness.  Redness.  Soreness.  Warmth.   You have a fever.   You have new symptoms.  GET HELP RIGHT AWAY IF:   You have a very bad headache.  You have neck pain.  Your neck is stiff.   You throw up (vomit).   You feel very sleepy.   You see red streaks coming from the affected area.   Your bone or joint underneath the affected area becomes painful after the skin has healed.   The affected area turns darker.   You have trouble breathing.    This information is not intended to replace advice given to you by your health care provider. Make sure you discuss any questions you have with your health care provider.   Document Released: 07/26/2009 Document Revised: 06/19/2015 Document Reviewed: 02/13/2015 Elsevier Interactive Patient Education [employer].

## 2016-04-30 ENCOUNTER — Encounter: Payer: Self-pay | Admitting: Family Medicine

## 2016-04-30 ENCOUNTER — Ambulatory Visit (INDEPENDENT_AMBULATORY_CARE_PROVIDER_SITE_OTHER): Payer: Medicare Other | Admitting: Family Medicine

## 2016-04-30 VITALS — BP 117/52 | HR 66 | Wt 196.0 lb

## 2016-04-30 DIAGNOSIS — L237 Allergic contact dermatitis due to plants, except food: Secondary | ICD-10-CM | POA: Diagnosis not present

## 2016-04-30 MED ORDER — HYDROXYZINE HCL 50 MG PO TABS: 25.0000 mg | ORAL_TABLET | Freq: Three times a day (TID) | ORAL | Status: DC | PRN

## 2016-04-30 MED ORDER — PREDNISONE 20 MG PO TABS: 40.0000 mg | ORAL_TABLET | Freq: Every day | ORAL | Status: DC

## 2016-04-30 NOTE — Progress Notes (Signed)
   Subjective:    Patient ID: Justin Mcgee, male    DOB: 1935-10-08, 81 y.o.   MRN: [id number]  HPI  F/u poison ivy.   He was actually recently seen in urgent care for an outbreak of poison ivy. He was given Decadron injection in 5 days of prednisone. It did seem to respond well but now that he's been off the president prednisone for a few days he feels like the rash is coming back. It's predominantly over the forearms and upper arms as well as his upper back and lower chest and abdominal area. He says it's intensely itchy. He's been taking cetirizine as well without any significant relief. He's also been using hydrocortisone prescription cream again without significant relief.   Review of Systems     Objective:   Physical Exam  Constitutional: He is oriented to person, place, and time. He appears well-developed and well-nourished.  HENT:  Head: Normocephalic and atraumatic.  Eyes: Conjunctivae and EOM are normal.  Cardiovascular: Normal rate.   Pulmonary/Chest: Effort normal.  Neurological: He is alert and oriented to person, place, and time.  Skin: Skin is dry. Rash noted. No pallor.  Erythematous mostly macular rash over both lower arms and upper arms as well as of his upper back. I did not examine the abdomen. He does have a few places where there are scabs from excoriation. Vesicles. Though he reports that he did have some initially at the beginning of the outbreak.  Psychiatric: He has a normal mood and affect. His behavior is normal.  Vitals reviewed.         Assessment & Plan:  Poison ivy dermatitis-will treat with 14 day course of prednisone. Discussed diagnosis and symptoms. Call if not completely resolved in 2 weeks. We'll switch the hydroxyzine but warned about sedation and dry mouth.

## 2016-05-01 ENCOUNTER — Encounter: Payer: Self-pay | Admitting: Family Medicine

## 2016-05-07 DIAGNOSIS — H40012 Open angle with borderline findings, low risk, left eye: Secondary | ICD-10-CM | POA: Diagnosis not present

## 2016-05-07 DIAGNOSIS — H40011 Open angle with borderline findings, low risk, right eye: Secondary | ICD-10-CM | POA: Diagnosis not present

## 2016-05-07 DIAGNOSIS — H524 Presbyopia: Secondary | ICD-10-CM | POA: Diagnosis not present

## 2016-05-07 DIAGNOSIS — Z961 Presence of intraocular lens: Secondary | ICD-10-CM | POA: Diagnosis not present

## 2016-05-19 ENCOUNTER — Ambulatory Visit (INDEPENDENT_AMBULATORY_CARE_PROVIDER_SITE_OTHER): Payer: Medicare Other

## 2016-05-19 DIAGNOSIS — R002 Palpitations: Secondary | ICD-10-CM

## 2016-05-19 LAB — EXERCISE TOLERANCE TEST
CHL CUP RESTING HR STRESS: 56 {beats}/min
CHL RATE OF PERCEIVED EXERTION: 15
CSEPED: 8 min
CSEPEW: 9.2 METS
CSEPPHR: 141 {beats}/min
Exercise duration (sec): 0 s
MPHR: 141 {beats}/min
Percent HR: 100 %

## 2016-05-24 ENCOUNTER — Other Ambulatory Visit: Payer: Self-pay | Admitting: Family Medicine

## 2016-06-01 ENCOUNTER — Encounter (HOSPITAL_COMMUNITY): Payer: Self-pay | Admitting: Emergency Medicine

## 2016-06-01 DIAGNOSIS — Z85810 Personal history of malignant neoplasm of tongue: Secondary | ICD-10-CM | POA: Diagnosis not present

## 2016-06-01 DIAGNOSIS — Z79899 Other long term (current) drug therapy: Secondary | ICD-10-CM | POA: Diagnosis not present

## 2016-06-01 DIAGNOSIS — S70362A Insect bite (nonvenomous), left thigh, initial encounter: Secondary | ICD-10-CM | POA: Insufficient documentation

## 2016-06-01 DIAGNOSIS — R509 Fever, unspecified: Secondary | ICD-10-CM | POA: Diagnosis present

## 2016-06-01 DIAGNOSIS — Z87891 Personal history of nicotine dependence: Secondary | ICD-10-CM | POA: Diagnosis not present

## 2016-06-01 DIAGNOSIS — R109 Unspecified abdominal pain: Secondary | ICD-10-CM | POA: Diagnosis not present

## 2016-06-01 DIAGNOSIS — R000 Tachycardia, unspecified: Secondary | ICD-10-CM | POA: Diagnosis not present

## 2016-06-01 DIAGNOSIS — Y929 Unspecified place or not applicable: Secondary | ICD-10-CM | POA: Diagnosis not present

## 2016-06-01 DIAGNOSIS — R55 Syncope and collapse: Secondary | ICD-10-CM | POA: Diagnosis not present

## 2016-06-01 DIAGNOSIS — R51 Headache: Secondary | ICD-10-CM | POA: Diagnosis not present

## 2016-06-01 DIAGNOSIS — Z85828 Personal history of other malignant neoplasm of skin: Secondary | ICD-10-CM | POA: Insufficient documentation

## 2016-06-01 DIAGNOSIS — Y939 Activity, unspecified: Secondary | ICD-10-CM | POA: Insufficient documentation

## 2016-06-01 DIAGNOSIS — Y999 Unspecified external cause status: Secondary | ICD-10-CM | POA: Diagnosis not present

## 2016-06-01 DIAGNOSIS — W57XXXA Bitten or stung by nonvenomous insect and other nonvenomous arthropods, initial encounter: Secondary | ICD-10-CM | POA: Insufficient documentation

## 2016-06-01 MED ORDER — ACETAMINOPHEN 325 MG PO TABS
650.0000 mg | ORAL_TABLET | Freq: Once | ORAL | Status: AC | PRN
  Administered 2016-06-01: 650 mg via ORAL
  Filled 2016-06-01: qty 2

## 2016-06-01 NOTE — ED Triage Notes (Signed)
Pt states that he got bit by a tick two weeks ago and yesterday and since yesterday he's had fever, nausea and generalized weakness. Alert and oriented.

## 2016-06-02 ENCOUNTER — Emergency Department (HOSPITAL_COMMUNITY)
Admission: EM | Admit: 2016-06-02 | Discharge: 2016-06-02 | Disposition: A | Discharge status: Home / Self Care | Payer: Medicare Other | Attending: Emergency Medicine | Admitting: Emergency Medicine

## 2016-06-02 DIAGNOSIS — S70362A Insect bite (nonvenomous), left thigh, initial encounter: Secondary | ICD-10-CM | POA: Diagnosis not present

## 2016-06-02 DIAGNOSIS — Z9189 Other specified personal risk factors, not elsewhere classified: Secondary | ICD-10-CM

## 2016-06-02 LAB — CBC WITH DIFFERENTIAL/PLATELET
BASOS ABS: 0 10*3/uL (ref 0.0–0.1)
BASOS PCT: 0 %
EOS ABS: 0 10*3/uL (ref 0.0–0.7)
Eosinophils Relative: 0 %
HEMATOCRIT: 37.7 % — AB (ref 39.0–52.0)
HEMOGLOBIN: 13.5 g/dL (ref 13.0–17.0)
LYMPHS ABS: 0.8 10*3/uL (ref 0.7–4.0)
Lymphocytes Relative: 18 %
MCH: 30.8 pg (ref 26.0–34.0)
MCHC: 35.8 g/dL (ref 30.0–36.0)
MCV: 85.9 fL (ref 78.0–100.0)
Monocytes Absolute: 0.6 10*3/uL (ref 0.1–1.0)
Monocytes Relative: 13 %
Neutro Abs: 3 10*3/uL (ref 1.7–7.7)
Neutrophils Relative %: 69 %
Platelets: 97 10*3/uL — ABNORMAL LOW (ref 150–400)
RBC: 4.39 MIL/uL (ref 4.22–5.81)
RDW: 12.8 % (ref 11.5–15.5)
WBC: 4.3 10*3/uL (ref 4.0–10.5)

## 2016-06-02 LAB — BASIC METABOLIC PANEL
Anion gap: 5 (ref 5–15)
BUN: 21 mg/dL — AB (ref 6–20)
CALCIUM: 8.8 mg/dL — AB (ref 8.9–10.3)
CHLORIDE: 105 mmol/L (ref 101–111)
CO2: 26 mmol/L (ref 22–32)
CREATININE: 1.22 mg/dL (ref 0.61–1.24)
GFR calc non Af Amer: 55 mL/min — ABNORMAL LOW (ref >60)
Glucose, Bld: 149 mg/dL — ABNORMAL HIGH (ref 65–99)
Potassium: 4 mmol/L (ref 3.5–5.1)
SODIUM: 136 mmol/L (ref 135–145)

## 2016-06-02 LAB — I-STAT CG4 LACTIC ACID, ED: LACTIC ACID, VENOUS: 1.77 mmol/L (ref 0.5–1.9)

## 2016-06-02 MED ORDER — SODIUM CHLORIDE 0.9 % IV BOLUS (SEPSIS)
1000.0000 mL | Freq: Once | INTRAVENOUS | Status: AC
  Administered 2016-06-02: 1000 mL via INTRAVENOUS

## 2016-06-02 MED ORDER — DOXYCYCLINE HYCLATE 100 MG PO TABS
100.0000 mg | ORAL_TABLET | Freq: Once | ORAL | Status: AC
  Administered 2016-06-02: 100 mg via ORAL
  Filled 2016-06-02: qty 1

## 2016-06-02 MED ORDER — DOXYCYCLINE HYCLATE 100 MG PO CAPS: 100.0000 mg | ORAL_CAPSULE | Freq: Two times a day (BID) | ORAL | 0 refills | Status: DC

## 2016-06-02 MED ORDER — KETOROLAC TROMETHAMINE 30 MG/ML IJ SOLN
30.0000 mg | Freq: Once | INTRAMUSCULAR | Status: AC
  Administered 2016-06-02: 30 mg via INTRAVENOUS
  Filled 2016-06-02: qty 1

## 2016-06-02 NOTE — ED Provider Notes (Signed)
[facility] DEPT Provider Note   CSN: [id number] Arrival date & time: 06/01/16  2253  By signing my name below, I, Higinio Plan, attest that this documentation has been prepared under the direction and in the presence of Everlene Balls, MD . Electronically Signed: Higinio Plan, Scribe. 06/02/2016. 1:20 AM.  History   Chief Complaint Chief Complaint  Patient presents with  . Fever  . Insect Bite   The history is provided by the patient. No language interpreter was used.   HPI Comments: Justin Mcgee is a 81 y.o. male who presents to the Emergency Department complaining of gradually worsening, abdominal pain, headache, dizziness and nausea s/p being bitten by a tick 2 weeks ago and yesterday. Pt reports he was bitten by a tick on his left thigh yesterday and the back of his neck 2 weeks ago. He states associated fever (TMAX 103). Pt also notes improving rash to his left forearm that he was told was due to poison ivy. He denies hx of Lyme Disease. Pt reports he spends a lot of time outside.   Past Medical History:  Diagnosis Date  . 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 Former smoker   . Fracture of right radius 1956  . Fracture, metacarpal 1956   1, 2, 3  . Fracture, tibia 1964  . 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 Marica Otter, no radiation or chemo, clear LN.     Patient Active Problem List   Diagnosis Date Noted  . Neuropathy, lumbosacral (radicular) 08/08/2015  . 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 Primary osteoarthritis of both knees 02/02/2014  . IFG (impaired fasting glucose) 04/19/2013  . GERD (gastroesophageal reflux disease) 03/22/2013  . History of squamous cell carcinoma of tongue 03/22/2013  . History of basal cell carcinoma of skin 03/22/2013  . BPH (benign prostatic hyperplasia) 03/22/2013  . Former smoker     Past Surgical History:  Procedure Laterality Date  . APPENDECTOMY  1997  . CATARACT EXTRACTION    .  CHOLECYSTECTOMY  05/16/10   in [place] while on vacation  . [place]  . INGUINAL HERNIA REPAIR Left 1994  . PARTIAL GLOSSECTOMY     with unilat neck dissection     Home Medications    Prior to Admission medications   Medication Sig Start Date End Date Taking? Authorizing Provider  cetirizine (ZYRTEC) 5 MG tablet Take 1 tablet (5 mg total) by mouth daily. As needed for itching 04/20/16   Noland Fordyce, PA-C  Cholecalciferol (VITAMIN D3) 2000 UNITS TABS Take 1 tablet by mouth daily.    Historical Provider, MD  doxycycline (VIBRAMYCIN) 100 MG capsule Take 1 capsule (100 mg total) by mouth 2 (two) times daily. 06/02/16   Everlene Balls, MD  esomeprazole (NEXIUM) 20 MG capsule Take 20 mg by mouth daily at 12 noon.    Historical Provider, MD  gabapentin (NEURONTIN) 100 MG capsule TAKE 2 CAPSULES AT BEDTIME 05/25/16   Hali Marry, MD  hydrOXYzine (ATARAX/VISTARIL) 50 MG tablet Take 0.5-1 tablets (25-50 mg total) by mouth 3 (three) times daily as needed for itching. 04/30/16   Hali Marry, MD  Multiple Vitamin (MULTIVITAMIN) capsule Take 1 capsule by mouth daily.    Historical Provider, MD  Omega-3 Fatty Acids (FISH OIL) 1000 MG CAPS Take 1 capsule by mouth daily.    Historical Provider, MD  pantoprazole (PROTONIX) 40 MG tablet  02/27/16  Historical Provider, MD  predniSONE (DELTASONE) 20 MG tablet Take 2 tablets (40 mg total) by mouth daily. After 5 days decrease to 1 a day, then after 5 more days decrease to 1/2 tab daily. 04/30/16   Hali Marry, MD  Saw Palmetto 450 MG CAPS Take 1 capsule by mouth daily.    Historical Provider, MD  VITAMIN E PO Take by mouth.    Historical Provider, MD    Family History Family History  Problem Relation Age of Onset  . GER disease Father   . Other Father     [place]  . Diabetes    . Colon cancer Neg Hx     Social History Social History  Substance Use Topics  . Smoking status: Former Smoker     Quit date: 02/19/1978  . Smokeless tobacco: Never Used  . Alcohol use No   Allergies   Other and Sulfa antibiotics  Review of Systems Review of Systems 10 systems reviewed and all are negative for acute change except as noted in the HPI.  Physical Exam Updated Vital Signs BP 119/65 (BP Location: Left Arm)   Pulse 103   Temp 102.7 F (39.3 C) (Oral)   Resp 15   Ht 6\' 2"  (1.88 m)   Wt 199 lb (90.3 kg)   SpO2 95%   BMI 25.55 kg/m   Physical Exam  Constitutional: He is oriented to person, place, and time. Vital signs are normal. He appears well-developed and well-nourished.  Non-toxic appearance. He does not appear ill. No distress.  HENT:  Head: Normocephalic and atraumatic.  Nose: Nose normal.  Mouth/Throat: Oropharynx is clear and moist. No oropharyngeal exudate.  Eyes: Conjunctivae and EOM are normal. Pupils are equal, round, and reactive to light. No scleral icterus.  Neck: Normal range of motion. Neck supple. No tracheal deviation, no edema, no erythema and normal range of motion present. No thyroid mass and no thyromegaly present.  Cardiovascular: S1 normal, S2 normal and normal pulses.  Tachycardia present.  Exam reveals no gallop and no friction rub.   No murmur heard.    Pulmonary/Chest: Effort normal and breath sounds normal. No respiratory distress. He has no wheezes. He has no rhonchi. He has no rales.  Abdominal: Soft. Normal appearance and bowel sounds are normal. He exhibits no distension, no ascites and no mass. There is no hepatosplenomegaly. There is no tenderness. There is no rebound, no guarding and no CVA tenderness.  Musculoskeletal: Normal range of motion. He exhibits no edema or tenderness.  Lymphadenopathy:    He has no cervical adenopathy.  Neurological: He is alert and oriented to person, place, and time. He has normal strength. No cranial nerve deficit or sensory deficit.  Skin: Skin is intact. Rash noted. No petechiae noted. He is not diaphoretic. No  erythema. No pallor.  Tactile fever Urticarial circular rash that is blanching on the left mid forearm no TTP  Nursing note and vitals reviewed.  ED Treatments / Results  Labs (all labs ordered are listed, but only abnormal results are displayed) Labs Reviewed  CBC WITH DIFFERENTIAL/PLATELET - Abnormal; Notable for the following:       Result Value   HCT 37.7 (*)    Platelets 97 (*)    All other components within normal limits  BASIC METABOLIC PANEL - Abnormal; Notable for the following:    Glucose, Bld 149 (*)    BUN 21 (*)    Calcium 8.8 (*)    GFR calc non Af  Amer 55 (*)    All other components within normal limits  I-STAT CG4 LACTIC ACID, ED    EKG  EKG Interpretation None       Radiology No results found.  Procedures Procedures  DIAGNOSTIC STUDIES:  Oxygen Saturation is 95% on RA, normal by my interpretation.    COORDINATION OF CARE:  1:16 AM Discussed treatment plan with pt at bedside and pt agreed to plan.  Medications Ordered in ED Medications  acetaminophen (TYLENOL) tablet 650 mg (650 mg Oral Given 06/01/16 2310)  sodium chloride 0.9 % bolus 1,000 mL (0 mLs Intravenous Stopped 06/02/16 0314)  sodium chloride 0.9 % bolus 1,000 mL (0 mLs Intravenous Stopped 06/02/16 0315)  ketorolac (TORADOL) 30 MG/ML injection 30 mg (30 mg Intravenous Given 06/02/16 0204)  doxycycline (VIBRA-TABS) tablet 100 mg (100 mg Oral Given 06/02/16 0155)   Patient presents to the ED for tick bite ad fever.  He has a fever here and is tachycardic.  He was given IVF, tylenol, and toradol.  Will start on doxy for treatment and encourage PCP fu which he states he has this week.  Upon repeat evaluation, Patient states he feels much better. Prescription given for doxycycline for 2 weeks. He shows good understanding of the plan. Return precautions given. Vital signs were within his normal limits and he is safe for discharge.  I personally performed the services described in this documentation,  which was scribed in my presence. The recorded information has been reviewed and is accurate.   Initial Impression / Assessment and Plan / ED Course  I have reviewed the triage vital signs and the nursing notes.  Pertinent labs & imaging results that were available during my care of the patient were reviewed by me and considered in my medical decision making (see chart for details).  Clinical Course      Final Clinical Impressions(s) / ED Diagnoses   Final diagnoses:  At high risk for tick borne illness    New Prescriptions Discharge Medication List as of 06/02/2016  3:08 AM    START taking these medications   Details  doxycycline (VIBRAMYCIN) 100 MG capsule Take 1 capsule (100 mg total) by mouth 2 (two) times daily., Starting Tue 06/02/2016, Print         Everlene Balls, MD 06/02/16 [id number]

## 2016-06-02 NOTE — ED Notes (Signed)
PT reporting fever and chills that has been ongoing for the last several days. Pt reports being bit by a tick appx 3 weeks ago on leg and posterior neck.

## 2016-06-03 ENCOUNTER — Ambulatory Visit (INDEPENDENT_AMBULATORY_CARE_PROVIDER_SITE_OTHER): Payer: Medicare Other | Admitting: Family Medicine

## 2016-06-03 DIAGNOSIS — R519 Headache, unspecified: Secondary | ICD-10-CM

## 2016-06-03 DIAGNOSIS — W57XXXA Bitten or stung by nonvenomous insect and other nonvenomous arthropods, initial encounter: Secondary | ICD-10-CM

## 2016-06-03 DIAGNOSIS — R110 Nausea: Secondary | ICD-10-CM

## 2016-06-03 DIAGNOSIS — T148 Other injury of unspecified body region: Secondary | ICD-10-CM

## 2016-06-03 DIAGNOSIS — R51 Headache: Secondary | ICD-10-CM | POA: Diagnosis not present

## 2016-06-03 DIAGNOSIS — D696 Thrombocytopenia, unspecified: Secondary | ICD-10-CM | POA: Diagnosis not present

## 2016-06-03 NOTE — Progress Notes (Signed)
Subjective:    CC:   HPI:  81 year old male has to is here today to follow-up on emergency department visit at [facility] on August 22. He had a tick bite that he experienced about 2 weeks ago. Since that time he had experienced abdominal pain, headache dizziness and nausea. He had another tick bite the day before on August 21 on his left thigh. The one 2 weeks ago was on the back of his neck. He reports that he did initially experience a fever with the highest being 103. He was given IV fluids Tylenol and Toradol and started on doxycycline. Still has some nausea and HA. No vomiting. Has a rashon left forearm but thinks it was from poison ivy. He did experience some sweats last night but says this morning when he got up he checked his temperature and it was normal.  He says HAs are very rare for him.    Past medical history, Surgical history, Family history not pertinant except as noted below, Social history, Allergies, and medications have been entered into the medical record, reviewed, and corrections made.   Review of Systems: No fevers, chills, night sweats, weight loss, chest pain, or shortness of breath.   Objective:    General: Well Developed, well nourished, and in no acute distress.  Neuro: Alert and oriented x3, extra-ocular muscles intact, sensation grossly intact.  HEENT: Normocephalic, atraumatic  Skin: Warm and dry, no rashes. Cardiac: Regular rate and rhythm, no murmurs rubs or gallops, no lower extremity edema.  Respiratory: Clear to auscultation bilaterally. Not using accessory muscles, speaking in full sentences.      Impression and Recommendations:   Tick bite - Continue doxycycline. monitor for fever. Will check Lyme titers and recommended spotted fever antibodies. We will need to repeat CBC early next week to see if his thrombocytopenia is resolving.  Thrombocytopenia-recommend recheck CBC early next week.  We will need to repeat CBC early next week to see if his  thrombocytopenia is resolving.  Rash - unclear etiology. He feels like it was from recent exposure to poison ivy. It doesn't look like a classic tick rash. It also looks like the spongiotic dermatitis that he is experienced on the back of his neck previously.

## 2016-06-04 ENCOUNTER — Other Ambulatory Visit: Payer: Self-pay | Admitting: Family Medicine

## 2016-06-04 DIAGNOSIS — D649 Anemia, unspecified: Secondary | ICD-10-CM

## 2016-06-04 DIAGNOSIS — D696 Thrombocytopenia, unspecified: Secondary | ICD-10-CM

## 2016-06-04 LAB — CBC WITH DIFFERENTIAL/PLATELET
Basophils Absolute: 35 cells/uL (ref 0–200)
Basophils Relative: 1 %
EOS ABS: 35 {cells}/uL (ref 15–500)
Eosinophils Relative: 1 %
HEMATOCRIT: 40.8 % (ref 38.5–50.0)
Hemoglobin: 13.8 g/dL (ref 13.2–17.1)
LYMPHS PCT: 33 %
Lymphs Abs: 1155 cells/uL (ref 850–3900)
MCH: 29.8 pg (ref 27.0–33.0)
MCHC: 33.8 g/dL (ref 32.0–36.0)
MCV: 88.1 fL (ref 80.0–100.0)
MONO ABS: 805 {cells}/uL (ref 200–950)
MPV: 10.5 fL (ref 7.5–12.5)
Monocytes Relative: 23 %
NEUTROS PCT: 42 %
Neutro Abs: 1470 cells/uL — ABNORMAL LOW (ref 1500–7800)
Platelets: 88 10*3/uL — ABNORMAL LOW (ref 140–400)
RBC: 4.63 MIL/uL (ref 4.20–5.80)
RDW: 13.3 % (ref 11.0–15.0)
WBC: 3.5 10*3/uL — AB (ref 3.8–10.8)

## 2016-06-04 LAB — LYME AB/WESTERN BLOT REFLEX: B burgdorferi Ab IgG+IgM: <0.9 Index (ref <0.90)

## 2016-06-04 LAB — ROCKY MTN SPOTTED FVR ABS PNL(IGG+IGM)
RMSF IgG: NOT DETECTED
RMSF IgM: NOT DETECTED

## 2016-06-10 DIAGNOSIS — D696 Thrombocytopenia, unspecified: Secondary | ICD-10-CM | POA: Diagnosis not present

## 2016-06-10 LAB — CBC WITH DIFFERENTIAL/PLATELET
BASOS ABS: 51 {cells}/uL (ref 0–200)
Basophils Relative: 1 %
EOS ABS: 153 {cells}/uL (ref 15–500)
Eosinophils Relative: 3 %
HEMATOCRIT: 35.6 % — AB (ref 38.5–50.0)
Hemoglobin: 12.1 g/dL — ABNORMAL LOW (ref 13.2–17.1)
LYMPHS PCT: 37 %
Lymphs Abs: 1887 cells/uL (ref 850–3900)
MCH: 29.7 pg (ref 27.0–33.0)
MCHC: 34 g/dL (ref 32.0–36.0)
MCV: 87.3 fL (ref 80.0–100.0)
MONO ABS: 510 {cells}/uL (ref 200–950)
MONOS PCT: 10 %
MPV: 9.9 fL (ref 7.5–12.5)
NEUTROS PCT: 49 %
Neutro Abs: 2499 cells/uL (ref 1500–7800)
PLATELETS: 191 10*3/uL (ref 140–400)
RBC: 4.08 MIL/uL — ABNORMAL LOW (ref 4.20–5.80)
RDW: 13.4 % (ref 11.0–15.0)
WBC: 5.1 10*3/uL (ref 3.8–10.8)

## 2016-06-11 NOTE — Addendum Note (Signed)
Addended by: Teddy Spike on: 06/11/2016 07:53 AM   Modules accepted: Orders

## 2016-07-01 ENCOUNTER — Ambulatory Visit (INDEPENDENT_AMBULATORY_CARE_PROVIDER_SITE_OTHER): Payer: Medicare Other | Admitting: Family Medicine

## 2016-07-01 ENCOUNTER — Encounter: Payer: Self-pay | Admitting: Family Medicine

## 2016-07-01 VITALS — BP 119/55 | HR 58 | Wt 199.0 lb

## 2016-07-01 DIAGNOSIS — R260 Ataxic gait: Secondary | ICD-10-CM

## 2016-07-01 DIAGNOSIS — T148 Other injury of unspecified body region: Secondary | ICD-10-CM

## 2016-07-01 DIAGNOSIS — Z23 Encounter for immunization: Secondary | ICD-10-CM | POA: Diagnosis not present

## 2016-07-01 DIAGNOSIS — D649 Anemia, unspecified: Secondary | ICD-10-CM

## 2016-07-01 DIAGNOSIS — Z1321 Encounter for screening for nutritional disorder: Secondary | ICD-10-CM | POA: Diagnosis not present

## 2016-07-01 DIAGNOSIS — B354 Tinea corporis: Secondary | ICD-10-CM | POA: Diagnosis not present

## 2016-07-01 DIAGNOSIS — R2689 Other abnormalities of gait and mobility: Secondary | ICD-10-CM

## 2016-07-01 DIAGNOSIS — W57XXXA Bitten or stung by nonvenomous insect and other nonvenomous arthropods, initial encounter: Secondary | ICD-10-CM

## 2016-07-01 MED ORDER — TERBINAFINE HCL 1 % EX CREA: 1.0000 "application " | TOPICAL_CREAM | Freq: Two times a day (BID) | CUTANEOUS | 0 refills | Status: DC

## 2016-07-01 NOTE — Progress Notes (Signed)
   Subjective:    Patient ID: Justin Mcgee, male    DOB: 07/28/1935, 81 y.o.   MRN: [id number]  HPI 4 week follow-up for tick bite. He 81 year old male he went to the emergency department on August 27 for a tick bite that he has experienced 2 weeks prior to that. He was given doxycycline. He did experience thrombocytopenia and a rash on his left forearm. There was a question about whether or not it may have been poison ivy dermatitis in regards to the rash. Did encourage him to complete the doxycycline and recommended a repeat CBC to make sure the thrombus cytopenia was resolving. Platelets were back up into a normal range on August 30. There was a slight drop in the hemoglobin to 12.1 so I did recommend repeating that in one month. He did complete the antibiotic. He has not had any fevers or new rashes since I last saw him. The rash on his left forearm has not healed is still there and occasionally feels itchy. He has noticed that he has been stumbling a little bit more than usual but denies any dizziness or lightheadedness.   Review of Systems     Objective:   Physical Exam  Constitutional: He is oriented to person, place, and time. He appears well-developed and well-nourished.  HENT:  Head: Normocephalic and atraumatic.  Right Ear: External ear normal.  Left Ear: External ear normal.  Nose: Nose normal.  Mouth/Throat: Oropharynx is clear and moist.  TMs and canals are clear.   Eyes: Conjunctivae and EOM are normal. Pupils are equal, round, and reactive to light.  Neck: Normal range of motion. Neck supple. No thyromegaly present.  Cardiovascular: Normal rate, regular rhythm, normal heart sounds and intact distal pulses.   Pulmonary/Chest: Effort normal and breath sounds normal.  Musculoskeletal: Normal range of motion.  Lymphadenopathy:    He has no cervical adenopathy.  Neurological: He is alert and oriented to person, place, and time. He has normal reflexes.  Alert and oriented.  CN  2-12 intact.  Neg rhomberg. Normal rapid alternating movements of hands. Reflexes symmetric in the UE and LE.  Normal knee to ankle, down the shin bilaterally.  No tremor.     Skin: Skin is warm and dry. Rash noted.  dry erythematous scaling rash with well demarcated margin on the left forearm. See photo from previous visit.   Psychiatric: He has a normal mood and affect. His behavior is normal. Judgment and thought content normal.       Assessment & Plan:  Tick bite-he is feeling much better overall. Continue to monitor for any new symptoms.  Low hemoglobin-due to recheck today. We'll also check for iron deficiency. He has had palms with low B12 in the past so we'll check that again that he is on supplementation with 250 g of B12 daily.  Stumbling-he has not fallen. Encouraged him to keep an eye on this. Explained that fatigue and alternate she wear can sometimes cause this to happen. He has a completely normal neurologic exam today but certainly if he feels like it's progressing or getting worse then please let me know.  Rash - left forearm-it's not resolving on its own. Initially it was thought it was from poison ivy but since it's not healing I suspect a tinea. Will treat with topical Lamisil cream. Call if not resolving over 3 weeks. The rash also looks somewhat consistent with the spongiotic dermatitis that he occasionally experiences on his upper back.

## 2016-07-02 LAB — CBC
HCT: 38.8 % (ref 38.5–50.0)
Hemoglobin: 13.2 g/dL (ref 13.2–17.1)
MCH: 30.4 pg (ref 27.0–33.0)
MCHC: 34 g/dL (ref 32.0–36.0)
MCV: 89.4 fL (ref 80.0–100.0)
MPV: 10.3 fL (ref 7.5–12.5)
Platelets: 152 10*3/uL (ref 140–400)
RBC: 4.34 MIL/uL (ref 4.20–5.80)
RDW: 13.4 % (ref 11.0–15.0)
WBC: 4.7 10*3/uL (ref 3.8–10.8)

## 2016-07-02 LAB — VITAMIN B12: VITAMIN B 12: 1327 pg/mL — AB (ref 200–1100)

## 2016-07-02 LAB — IRON: Iron: 84 ug/dL (ref 50–180)

## 2016-07-02 LAB — FERRITIN: Ferritin: 157 ng/mL (ref 20–380)

## 2016-08-06 ENCOUNTER — Encounter: Payer: Self-pay | Admitting: Family Medicine

## 2016-08-06 ENCOUNTER — Ambulatory Visit (INDEPENDENT_AMBULATORY_CARE_PROVIDER_SITE_OTHER): Payer: Medicare Other | Admitting: Family Medicine

## 2016-08-06 VITALS — BP 125/55 | HR 57 | Ht 74.0 in | Wt 198.0 lb

## 2016-08-06 DIAGNOSIS — R7989 Other specified abnormal findings of blood chemistry: Secondary | ICD-10-CM | POA: Diagnosis not present

## 2016-08-06 DIAGNOSIS — Z974 Presence of external hearing-aid: Secondary | ICD-10-CM | POA: Diagnosis not present

## 2016-08-06 DIAGNOSIS — R350 Frequency of micturition: Secondary | ICD-10-CM

## 2016-08-06 DIAGNOSIS — R7301 Impaired fasting glucose: Secondary | ICD-10-CM

## 2016-08-06 DIAGNOSIS — N401 Enlarged prostate with lower urinary tract symptoms: Secondary | ICD-10-CM

## 2016-08-06 LAB — POCT GLYCOSYLATED HEMOGLOBIN (HGB A1C): HEMOGLOBIN A1C: 5.2

## 2016-08-06 MED ORDER — GABAPENTIN 100 MG PO CAPS: 200.0000 mg | ORAL_CAPSULE | Freq: Every day | ORAL | 3 refills | Status: DC

## 2016-08-06 NOTE — Progress Notes (Signed)
Subjective:    CC: HTN  HPI: IFG - And crease thirst or urination. He says he tries really hard to eat healthy. He has not been exercising regularly but plans to get back on track.  BPH- He is just using cell primary. He says overall it works okay for him and he's not interested in any prescription medications at this point in time. He did want to let me know about an episode last week where he had diarrhea for about 2 days. No blood no fevers chills or sweats. It did start after he went out to eat. Though no one else got sick.  B12. On his last set of labs his B12 levels were high. We called him and encouraged him to stop any supplementation. He also found that the Slim fast that he was drinking for lunch a lot of days has a lot of B12 as well as his Gatorade and has cut back on both of those as well.  He also got bilateral hearing aids since I last saw him.  Past medical history, Surgical history, Family history not pertinant except as noted below, Social history, Allergies, and medications have been entered into the medical record, reviewed, and corrections made.   Review of Systems: No fevers, chills, night sweats, weight loss, chest pain, or shortness of breath.   Objective:    General: Well Developed, well nourished, and in no acute distress.  Neuro: Alert and oriented x3, extra-ocular muscles intact, sensation grossly intact.  HEENT: Normocephalic, atraumatic  Skin: Warm and dry, no rashes. Cardiac: Regular rate and rhythm, no murmurs rubs or gallops, no lower extremity edema.  Respiratory: Clear to auscultation bilaterally. Not using accessory muscles, speaking in full sentences.   Impression and Recommendations:   IFG - A1c looks fantastic today at 5.4 which is improved from last time. Continue to monitor. Follow-up in 6 months.  BPH -patient prefers to contain was saw palmetto.  He is not interested in medications at this time.   Diarrhea-seems to be self-limited. Call if  symptoms recur.  High B12 levels-we'll recheck today.

## 2016-08-07 ENCOUNTER — Ambulatory Visit: Payer: Medicare Other | Admitting: Family Medicine

## 2016-08-07 LAB — VITAMIN B12: Vitamin B-12: 893 pg/mL (ref 200–1100)

## 2016-11-17 ENCOUNTER — Encounter: Payer: Self-pay | Admitting: Physician Assistant

## 2016-11-19 ENCOUNTER — Encounter: Payer: Self-pay | Admitting: Family Medicine

## 2016-11-19 ENCOUNTER — Ambulatory Visit (INDEPENDENT_AMBULATORY_CARE_PROVIDER_SITE_OTHER): Payer: Medicare Other | Admitting: Family Medicine

## 2016-11-19 VITALS — BP 118/60 | HR 67 | Ht 74.0 in | Wt 198.0 lb

## 2016-11-19 DIAGNOSIS — J018 Other acute sinusitis: Secondary | ICD-10-CM

## 2016-11-19 DIAGNOSIS — N401 Enlarged prostate with lower urinary tract symptoms: Secondary | ICD-10-CM | POA: Diagnosis not present

## 2016-11-19 DIAGNOSIS — R350 Frequency of micturition: Secondary | ICD-10-CM | POA: Diagnosis not present

## 2016-11-19 MED ORDER — TAMSULOSIN HCL 0.4 MG PO CAPS: 0.4000 mg | ORAL_CAPSULE | Freq: Every day | ORAL | 3 refills | Status: DC

## 2016-11-19 MED ORDER — AMOXICILLIN 875 MG PO TABS: 875.0000 mg | ORAL_TABLET | Freq: Two times a day (BID) | ORAL | 0 refills | Status: DC

## 2016-11-19 NOTE — Patient Instructions (Addendum)

## 2016-11-19 NOTE — Progress Notes (Signed)
   Subjective:    Patient ID: Justin Mcgee, male    DOB: [birth year], 82 y.o.   MRN: [id number]  HPI Patient comes in today complaining of one week of sinus congestion with green mucus. Denies any facial pain or pressure. No fevers chills or sweats. Currently not taking anything over-the-counter. Has been using some Vicks nasal spray though. He says it's just not getting any better. No sore throat or ear pain. No GI symptoms.  Benign prostatic hypertrophy-he is mostly been controlling this with saw palmetto but today says that he would actually like to start the medication.  Review of Systems     Objective:   Physical Exam  Constitutional: He is oriented to person, place, and time. He appears well-developed and well-nourished.  HENT:  Head: Normocephalic and atraumatic.  Right Ear: External ear normal.  Left Ear: External ear normal.  Nose: Nose normal.  Mouth/Throat: Oropharynx is clear and moist.  TMs and canals are clear.   Eyes: Conjunctivae and EOM are normal. Pupils are equal, round, and reactive to light.  Neck: Neck supple. No thyromegaly present.  Cardiovascular: Normal rate and normal heart sounds.   Pulmonary/Chest: Effort normal and breath sounds normal.  Lymphadenopathy:    He has no cervical adenopathy.  Neurological: He is alert and oriented to person, place, and time.  Skin: Skin is warm and dry.  Psychiatric: He has a normal mood and affect.          Assessment & Plan:  Acute sinusitis-we'll treat with amoxicillin. Call if not significantly better in one week. Call sooner if develops fever, suddenly gets worse or develops facial pain.  BPH- we'll start Flomax. Warned about potential side effects. Follow-up in 8 weeks.

## 2016-12-04 ENCOUNTER — Ambulatory Visit (INDEPENDENT_AMBULATORY_CARE_PROVIDER_SITE_OTHER): Payer: Medicare Other | Admitting: Physician Assistant

## 2016-12-04 ENCOUNTER — Telehealth: Payer: Self-pay

## 2016-12-04 VITALS — BP 127/70 | HR 71 | Wt 197.0 lb

## 2016-12-04 DIAGNOSIS — R55 Syncope and collapse: Secondary | ICD-10-CM | POA: Diagnosis not present

## 2016-12-04 DIAGNOSIS — R2681 Unsteadiness on feet: Secondary | ICD-10-CM | POA: Diagnosis not present

## 2016-12-04 DIAGNOSIS — R42 Dizziness and giddiness: Secondary | ICD-10-CM | POA: Diagnosis not present

## 2016-12-04 MED ORDER — FINASTERIDE 1 MG PO TABS: 1.0000 mg | ORAL_TABLET | Freq: Every day | ORAL | 3 refills | Status: DC

## 2016-12-04 NOTE — Patient Instructions (Addendum)
I have ordered an echocardiogram (ultrasound of your heart) and a holter monitor. You will be contacted within the next 3 business days about scheduling this  In the meantime, - Discontinue Flomax - take your time with position change - stay well hydrated    Orthostatic Hypotension Orthostatic hypotension is a sudden drop in blood pressure that happens when you quickly change positions, such as when you get up from a seated or lying position. Blood pressure is a measurement of how strongly, or weakly, your blood is pressing against the walls of your arteries. Arteries are blood vessels that carry blood from your heart throughout your body. When blood pressure is too low, you may not get enough blood to your brain or to the rest of your organs. This can cause weakness, light-headedness, rapid heartbeat, and fainting. This can last for just a few seconds or for up to a few minutes. Orthostatic hypotension is usually not a serious problem. However, if it happens frequently or gets worse, it may be a sign of something more serious. What are the causes? This condition may be caused by:  Sudden changes in posture, such as standing up quickly after you have been sitting or lying down.  Blood loss.  Loss of body fluids (dehydration).  Heart problems.  Hormone (endocrine) problems.  Pregnancy.  Severe infection.  Lack of certain nutrients.  Severe allergic reactions (anaphylaxis).  Certain medicines, such as blood pressure medicine or medicines that make the body lose excess fluids (diuretics). Sometimes, this condition can be caused by not taking medicine as directed, such as taking too much of a certain medicine. What increases the risk? Certain factors can make you more likely to develop orthostatic hypotension, including:  Age. Risk increases as you get older.  Conditions that affect the heart or the central nervous system.  Taking certain medicines, such as blood pressure  medicine or diuretics.  Being pregnant. What are the signs or symptoms? Symptoms of this condition may include:  Weakness.  Light-headedness.  Dizziness.  Blurred vision.  Fatigue.  Rapid heartbeat.  Fainting, in severe cases. How is this diagnosed? This condition is diagnosed based on:  Your medical history.  Your symptoms.  Your blood pressure measurement. Your health care provider will check your blood pressure when you are:  Lying down.  Sitting.  Standing. A blood pressure reading is recorded as two numbers, such as "120 over 80" (or 120/80). The first ("top") number is called the systolic pressure. It is a measure of the pressure in your arteries as your heart beats. The second ("bottom") number is called the diastolic pressure. It is a measure of the pressure in your arteries when your heart relaxes between beats. Blood pressure is measured in a unit called mm Hg. Healthy blood pressure for adults is 120/80. If your blood pressure is below 90/60, you may be diagnosed with hypotension. Other information or tests that may be used to diagnose orthostatic hypotension include:  Your other vital signs, such as your heart rate and temperature.  Blood tests.  Tilt table test. For this test, you will be safely secured to a table that moves you from a lying position to an upright position. Your heart rhythm and blood pressure will be monitored during the test. How is this treated? Treatment for this condition may include:  Changing your diet. This may involve eating more salt (sodium) or drinking more water.  Taking medicines to raise your blood pressure.  Changing the dosage of certain  medicines you are taking that might be lowering your blood pressure.  Wearing compression stockings. These stockings help to prevent blood clots and reduce swelling in your legs. In some cases, you may need to go to the hospital for:  Fluid replacement. This means you will receive  fluids through an IV tube.  Blood replacement. This means you will receive donated blood through an IV tube (transfusion).  Treating an infection or heart problems, if this applies.  Monitoring. You may need to be monitored while medicines that you are taking wear off. Follow these instructions at home: Eating and drinking  Drink enough fluid to keep your urine clear or pale yellow.  Eat a healthy diet and follow instructions from your health care provider about eating or drinking restrictions. A healthy diet includes:  Fresh fruits and vegetables.  Whole grains.  Lean meats.  Low-fat dairy products.  Eat extra salt only as directed. Do not add extra salt to your diet unless your health care provider told you to do that.  Eat frequent, small meals.  Avoid standing up suddenly after eating. Medicines  Take over-the-counter and prescription medicines only as told by your health care provider.  Follow instructions from your health care provider about changing the dosage of your current medicines, if this applies.  Do not stop or adjust any of your medicines on your own. General instructions  Wear compression stockings as told by your health care provider.  Get up slowly from lying down or sitting positions. This gives your blood pressure a chance to adjust.  Avoid hot showers and excessive heat as directed by your health care provider.  Return to your normal activities as told by your health care provider. Ask your health care provider what activities are safe for you.  Do not use any products that contain nicotine or tobacco, such as cigarettes and e-cigarettes. If you need help quitting, ask your health care provider.  Keep all follow-up visits as told by your health care provider. This is important. Contact a health care provider if:  You vomit.  You have diarrhea.  You have a fever for more than 2-3 days.  You feel more thirsty than usual.  You feel weak and  tired. Get help right away if:  You have chest pain.  You have a fast or irregular heartbeat.  You develop numbness in any part of your body.  You cannot move your arms or your legs.  You have trouble speaking.  You become sweaty or feel lightheaded.  You faint.  You feel short of breath.  You have trouble staying awake.  You feel confused. This information is not intended to replace advice given to you by your health care provider. Make sure you discuss any questions you have with your health care provider. Document Released: 09/18/2002 Document Revised: 06/16/2016 Document Reviewed: 03/20/2016 Elsevier Interactive Patient Education  2017 [employer]  Dizziness Dizziness is a common problem. It is a feeling of unsteadiness or light-headedness. You may feel like you are about to faint. Dizziness can lead to injury if you stumble or fall. Anyone can become dizzy, but dizziness is more common in older adults. This condition can be caused by a number of things, including medicines, dehydration, or illness. Follow these instructions at home: Taking these steps may help with your condition: Eating and drinking  Drink enough fluid to keep your urine clear or pale yellow. This helps to keep you from becoming dehydrated. Try to drink more clear  fluids, such as water.  Do not drink alcohol.  Limit your caffeine intake if directed by your health care provider.  Limit your salt intake if directed by your health care provider. Activity  Avoid making quick movements.  Rise slowly from chairs and steady yourself until you feel okay.  In the morning, first sit up on the side of the bed. When you feel okay, stand slowly while you hold onto something until you know that your balance is fine.  Move your legs often if you need to stand in one place for a long time. Tighten and relax your muscles in your legs while you are standing.  Do not drive or operate heavy machinery if you feel  dizzy.  Avoid bending down if you feel dizzy. Place items in your home so that they are easy for you to reach without leaning over. Lifestyle  Do not use any tobacco products, including cigarettes, chewing tobacco, or electronic cigarettes. If you need help quitting, ask your health care provider.  Try to reduce your stress level, such as with yoga or meditation. Talk with your health care provider if you need help. General instructions  Watch your dizziness for any changes.  Take medicines only as directed by your health care provider. Talk with your health care provider if you think that your dizziness is caused by a medicine that you are taking.  Tell a friend or a family member that you are feeling dizzy. If he or she notices any changes in your behavior, have this person call your health care provider.  Keep all follow-up visits as directed by your health care provider. This is important. Contact a health care provider if:  Your dizziness does not go away.  Your dizziness or light-headedness gets worse.  You feel nauseous.  You have reduced hearing.  You have new symptoms.  You are unsteady on your feet or you feel like the room is spinning. Get help right away if:  You vomit or have diarrhea and are unable to eat or drink anything.  You have problems talking, walking, swallowing, or using your arms, hands, or legs.  You feel generally weak.  You are not thinking clearly or you have trouble forming sentences. It may take a friend or family member to notice this.  You have chest pain, abdominal pain, shortness of breath, or sweating.  Your vision changes.  You notice any bleeding.  You have a headache.  You have neck pain or a stiff neck.  You have a fever. This information is not intended to replace advice given to you by your health care provider. Make sure you discuss any questions you have with your health care provider. Document Released: 03/24/2001  Document Revised: 03/05/2016 Document Reviewed: 09/24/2014 Elsevier Interactive Patient Education  2017 [employer].

## 2016-12-04 NOTE — Telephone Encounter (Signed)
Pt called and stated that he has been experiencing dizziness when he bends over since 11/23/16. Pt states he hit his head on 11/23/16. Pt stated he also started taking Tamsulosin on 11/20/16. Pt states he does not know if it is from the medicine or from hitting his head. Pt denies chest pain, shortness of breath, pupil changes, or headaches. Pt has an appt on 12/04/16. Pt advised if he gets any worse he needs to go the Ed.

## 2016-12-04 NOTE — Progress Notes (Signed)
HPI:                                                                Justin Mcgee is a 82 y.o. male who presents to [facility] today for dizziness  Patient endorses both dizziness and lightheadedness with position change for approx. 2 weeks. This is a new problem for him. Denies history of vertigo. Patient states on 2/12 he lost his balance, fell forward, and hit his head on a hanging picture frame. He denies LOC and states he remembers the entire event. Denies headache, vision change, paresthesias or focal weakness.  Patient endorses just this 1 fall at home this year. He wears bilateral knee braces. He does not ambulate with a cane or a walker.  Past Medical History:  Diagnosis Date  . 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 Former smoker   . Fracture of right radius 1956  . Fracture, metacarpal 1956   1, 2, 3  . Fracture, tibia 1964  . 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 Marica Otter, no radiation or chemo, clear LN.    Past Surgical History:  Procedure Laterality Date  . APPENDECTOMY  1997  . CATARACT EXTRACTION    . CHOLECYSTECTOMY  05/16/10   in [place] while on vacation  . [place]  . INGUINAL HERNIA REPAIR Left 1994  . PARTIAL GLOSSECTOMY     with unilat neck dissection   Social History  Substance Use Topics  . Smoking status: Former Smoker    Quit date: 02/19/1978  . Smokeless tobacco: Never Used  . Alcohol use No   family history includes GER disease in his father; Other in his father.  ROS: negative except as noted in the HPI  Medications: Current Outpatient Prescriptions  Medication Sig Dispense Refill  . gabapentin (NEURONTIN) 100 MG capsule Take 2 capsules (200 mg total) by mouth at bedtime. 180 capsule 3  . Omega-3 Fatty Acids (FISH OIL) 1000 MG CAPS Take 1 capsule by mouth daily.    . pantoprazole (PROTONIX) 40 MG tablet     . Saw  Palmetto 450 MG CAPS Take 1 capsule by mouth daily.    . tamsulosin (FLOMAX) 0.4 MG CAPS capsule Take 1 capsule (0.4 mg total) by mouth daily. 30 capsule 3  . VITAMIN E PO Take by mouth.     No current facility-administered medications for this visit.    Allergies  Allergen Reactions  . Other Other (See Comments)    Topical sulfa only. Gets skin irritation.  . Sulfa Antibiotics       Objective:  BP 127/70   Pulse 71   Wt 197 lb (89.4 kg)   BMI 25.29 kg/m  Gen: well-groomed, cooperative, not ill-appearing, no distress HEENT: normal conjunctiva, wearing hearing aids, TM's clear, oropharynx clear, moist mucus membranes Pulm: Normal work of breathing, normal phonation, clear to auscultation bilaterally, no wheezes, rales or rhonchi CV: Normal rate, regular rhythm, s1 and s2 distinct, no murmurs, clicks or rubs  Neuro: alert and oriented x 3, cranial nerves II-XII intact, negative pronator drift, normal coordination, DTR's intact Lymph: no cervical or tonsillar adenopathy Skin: warm and  dry, no rashes or lesions on exposed skin, no cyanosis   Exercise Tolerance Test 05/19/2016 There was no ST segment deviation noted during stress.  No T wave inversion was noted during stress. Arrhythmias during stress: rare PVCs.  Arrhythmias during recovery: none.  Arrhythmias were not significant.  ECG was interpretable and conclusive.  ECG performed today shows normal sinus rhythm Vent rate 65 PR-I 194 QRS 80 Qtc 395   Assessment and Plan: 82 y.o. male with   1. Postural dizziness with presyncope - ECG shows NSR today. No AV block or bradycardia. He did have a stress test in 05/2016 that showed rare PVC's, but was otherwise negative for ischemia, which is reassuring. - ordering ECHO to rule-out valvular disease, including aortic stenosis. Also ordering 48-hr holter to assess for dysrhythmia. - discontinuing Flomax. Patient would like to postpone starting Finasteride - instructed to  maintain good hydration status and take time with position changes - ECHOCARDIOGRAM COMPLETE; Future - Holter monitor - 48 hour; Future  2. Unsteady gait - patient is a fall risk. Would benefit from home health eval - recommend close follow-up with PCP  Patient education and anticipatory guidance given Patient agrees with treatment plan Follow-up with PCP in 2 weeks or sooner as needed   Darlyne Russian PA-C

## 2016-12-08 DIAGNOSIS — R2681 Unsteadiness on feet: Secondary | ICD-10-CM | POA: Insufficient documentation

## 2016-12-09 MED ORDER — AZITHROMYCIN 250 MG PO TABS: ORAL_TABLET | ORAL | 0 refills | Status: DC

## 2016-12-09 MED ORDER — AZITHROMYCIN 250 MG PO TABS: ORAL_TABLET | ORAL | 0 refills | Status: AC

## 2016-12-09 NOTE — Telephone Encounter (Signed)
Justin Mcgee states he believes he still has a sinus infection. He reports runny nose with yellow drainage, dry cough and slight pressure in ears. Denies fever, chills or sweats. He would like another antibiotic sent to pharmacy. Please advise.

## 2016-12-09 NOTE — Telephone Encounter (Signed)
OK, will call in zpack.  I accidentally sent to mail order, then resent to [facility].  If not better in 7 days needs OV>   Beatrice Lecher, MD

## 2016-12-15 ENCOUNTER — Other Ambulatory Visit: Payer: Self-pay | Admitting: Physician Assistant

## 2016-12-15 DIAGNOSIS — R42 Dizziness and giddiness: Secondary | ICD-10-CM

## 2016-12-15 DIAGNOSIS — R55 Syncope and collapse: Secondary | ICD-10-CM

## 2016-12-16 ENCOUNTER — Ambulatory Visit (INDEPENDENT_AMBULATORY_CARE_PROVIDER_SITE_OTHER): Payer: Medicare Other

## 2016-12-16 DIAGNOSIS — R55 Syncope and collapse: Secondary | ICD-10-CM

## 2016-12-16 DIAGNOSIS — R42 Dizziness and giddiness: Secondary | ICD-10-CM | POA: Diagnosis not present

## 2016-12-23 ENCOUNTER — Encounter: Payer: Self-pay | Admitting: Physician Assistant

## 2016-12-23 DIAGNOSIS — I471 Supraventricular tachycardia: Secondary | ICD-10-CM | POA: Insufficient documentation

## 2017-01-06 ENCOUNTER — Ambulatory Visit (HOSPITAL_BASED_OUTPATIENT_CLINIC_OR_DEPARTMENT_OTHER)
Admission: RE | Admit: 2017-01-06 | Discharge: 2017-01-06 | Disposition: A | Discharge status: Home / Self Care | Payer: Medicare Other | Source: Ambulatory Visit | Attending: Physician Assistant | Admitting: Physician Assistant

## 2017-01-06 DIAGNOSIS — R55 Syncope and collapse: Secondary | ICD-10-CM | POA: Insufficient documentation

## 2017-01-06 DIAGNOSIS — R42 Dizziness and giddiness: Secondary | ICD-10-CM | POA: Diagnosis not present

## 2017-01-06 DIAGNOSIS — Z85810 Personal history of malignant neoplasm of tongue: Secondary | ICD-10-CM | POA: Diagnosis not present

## 2017-01-06 DIAGNOSIS — I480 Paroxysmal atrial fibrillation: Secondary | ICD-10-CM | POA: Diagnosis not present

## 2017-01-06 DIAGNOSIS — I083 Combined rheumatic disorders of mitral, aortic and tricuspid valves: Secondary | ICD-10-CM | POA: Insufficient documentation

## 2017-01-06 DIAGNOSIS — Z85828 Personal history of other malignant neoplasm of skin: Secondary | ICD-10-CM | POA: Insufficient documentation

## 2017-01-06 NOTE — Progress Notes (Signed)
  Echocardiogram 2D Echocardiogram has been performed.  Donata Clay 01/06/2017, 11:35 AM

## 2017-01-07 ENCOUNTER — Encounter: Payer: Self-pay | Admitting: Physician Assistant

## 2017-01-07 ENCOUNTER — Encounter: Payer: Self-pay | Admitting: Cardiovascular Disease

## 2017-01-07 ENCOUNTER — Other Ambulatory Visit: Payer: Self-pay | Admitting: Physician Assistant

## 2017-01-07 DIAGNOSIS — I272 Pulmonary hypertension, unspecified: Secondary | ICD-10-CM | POA: Insufficient documentation

## 2017-01-07 NOTE — Progress Notes (Signed)
Patient found to have dilated RV and pulmonary arterial pressure of 36 mmHg on recent ECHO. Referring to cardiology for further management of pulmonary hypertension.

## 2017-01-11 ENCOUNTER — Ambulatory Visit: Payer: Medicare Other | Admitting: Family Medicine

## 2017-01-13 ENCOUNTER — Encounter: Payer: Self-pay | Admitting: Family Medicine

## 2017-01-13 ENCOUNTER — Ambulatory Visit: Payer: Medicare Other | Admitting: Cardiology

## 2017-01-13 ENCOUNTER — Ambulatory Visit (INDEPENDENT_AMBULATORY_CARE_PROVIDER_SITE_OTHER): Payer: Medicare Other | Admitting: Family Medicine

## 2017-01-13 VITALS — BP 117/68 | HR 59 | Ht 74.0 in | Wt 197.0 lb

## 2017-01-13 DIAGNOSIS — R21 Rash and other nonspecific skin eruption: Secondary | ICD-10-CM

## 2017-01-13 DIAGNOSIS — R55 Syncope and collapse: Secondary | ICD-10-CM | POA: Diagnosis not present

## 2017-01-13 DIAGNOSIS — R42 Dizziness and giddiness: Secondary | ICD-10-CM

## 2017-01-13 DIAGNOSIS — I471 Supraventricular tachycardia: Secondary | ICD-10-CM | POA: Diagnosis not present

## 2017-01-13 NOTE — Progress Notes (Signed)
Subjective:    Patient ID: Justin Mcgee, male    DOB: 1935-04-14, 82 y.o.   MRN: [id number]  HPI 6-year-old male comes in today to follow-up on his echocardiogram results. He actually presented to our office at the end of February for lightheadedness and dizziness with position change for approximately 2 weeks. He had actually lost his balance and fell forward in his his head on a picture frame. He did not lose consciousness. At the time his blood pressure looked great. EKG was normal and an echocardiogram was ordered as well as a Holter monitor. Holter monitor showed some rare PVCs and PACs and brief paroxysmal atrial tachycardia for 6-8 beat run. No atrial fibrillation though. And no significant pauses. Echocardiogram overall looks good as well. He also had some sinus congestion with green mucus around that same time and was treated with an antibiotic and says that his sinus symptoms are much better.  Has a rash on his abdomen that started a couple of days ago. He says it's very itchy. He denies any known triggers. No changes in soaps lotions etc. He has been doing some painting but says no pain has actually dropped on his abdomen. He hasn't really been out doing any yard work.   Review of Systems   BP 117/68   Pulse (!) 59   Ht 6\' 2"  (1.88 m)   Wt 197 lb (89.4 kg)   SpO2 98%   BMI 25.29 kg/m     Allergies  Allergen Reactions  . Other Other (See Comments)    Topical sulfa only. Gets skin irritation.  . Sulfa Antibiotics     Past Medical History:  Diagnosis Date  . 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 Former smoker   . Fracture of right radius 1956  . Fracture, metacarpal 1956   1, 2, 3  . Fracture, tibia 1964  . 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 Marica Otter, no radiation or chemo, clear LN.     Past Surgical History:  Procedure Laterality Date  . APPENDECTOMY  1997  . CATARACT EXTRACTION    . CHOLECYSTECTOMY  05/16/10   in  [place] while on vacation  . [place]  . INGUINAL HERNIA REPAIR Left 1994  . PARTIAL GLOSSECTOMY     with unilat neck dissection    Social History   Social History  . Marital status: Married    Spouse name: Justin Mcgee   . Number of children: 2  . Years of education: N/A   Occupational History  . [employer] manager [employer]   Social History Main Topics  . Smoking status: Former Smoker    Quit date: 02/19/1978  . Smokeless tobacco: Never Used  . Alcohol use No  . Drug use: No  . Sexual activity: Yes    Partners: Female   Other Topics Concern  . Not on file   Social History Narrative   4 caffeine drink per day.  40 min cardio/strength 6 days per week. Retired from [employer].              Family History  Problem Relation Age of Onset  . GER disease Father   . Other Father     [place]  . Diabetes    . Colon cancer Neg Hx     Outpatient Encounter Prescriptions as of 01/13/2017  Medication Sig  . gabapentin (NEURONTIN)  100 MG capsule Take 2 capsules (200 mg total) by mouth at bedtime.  . Omega-3 Fatty Acids (FISH OIL) 1000 MG CAPS Take 1 capsule by mouth daily.  . pantoprazole (PROTONIX) 40 MG tablet   . Saw Palmetto 450 MG CAPS Take 1 capsule by mouth daily.  [name] VITAMIN E PO Take by mouth.   No facility-administered encounter medications on file as of 01/13/2017.           Objective:   Physical Exam  Constitutional: He is oriented to person, place, and time. He appears well-developed and well-nourished.  HENT:  Head: Normocephalic and atraumatic.  Right Ear: External ear normal.  Left Ear: External ear normal.  Nose: Nose normal.  Mouth/Throat: Oropharynx is clear and moist.  TMs and canals are clear.   Eyes: Conjunctivae and EOM are normal. Pupils are equal, round, and reactive to light.  Neck: Neck supple. No thyromegaly present.  Cardiovascular: Normal rate and normal heart sounds.   Pulmonary/Chest:  Effort normal and breath sounds normal.  Lymphadenopathy:    He has no cervical adenopathy.  Neurological: He is alert and oriented to person, place, and time. No cranial nerve deficit.  Negative Dix-Hallpike maneuver.  Skin: Skin is warm and dry.  Pink erythematous macular papular rash over the right side of the abdomen. No vesicles open wounds or drainage. The skin has just a slight scale in a couple of areas.  Psychiatric: He has a normal mood and affect.        Assessment & Plan:    Lightheadedness and dizziness-seems to be gradually improving. Is not completely resolved but it's much better than it was several weeks ago.  He did have a brief paroxysmal atrial tachycardia on his Holter monitor as well as some mildly elevated peak pulmonary pressures on the echocardiogram so he is scheduled to see cardiology, Dr. Oval Linsey in about 3 weeks on April 23.  Rash-skin scraping performed. If negative will treat with topical steroid cream.likely contact dermatitis by appearance.

## 2017-01-15 LAB — FUNGAL STAIN

## 2017-01-18 ENCOUNTER — Other Ambulatory Visit: Payer: Self-pay | Admitting: Family Medicine

## 2017-01-18 ENCOUNTER — Telehealth: Payer: Self-pay

## 2017-01-18 MED ORDER — BETAMETHASONE DIPROPIONATE 0.05 % EX CREA: TOPICAL_CREAM | Freq: Every day | CUTANEOUS | 0 refills | Status: DC

## 2017-01-18 NOTE — Telephone Encounter (Signed)
Pt said that you had mentioned when he was here las week about a cream for his rash.  His pharmacy has not received anything.  Please advise

## 2017-01-18 NOTE — Telephone Encounter (Signed)
Please let him know I was waiting for the skin scraping but it did come back this morning so I sent over cream this afternoon. It should be available to pick up this evening.

## 2017-01-19 NOTE — Telephone Encounter (Signed)
Notified patient.

## 2017-01-31 NOTE — Progress Notes (Signed)
Cardiology Office Note   Date:  02/01/2017   ID:  Justin Mcgee, DOB Aug 07, 1935, Justin Mcgee  PCP:  Beatrice Lecher, MD  Cardiologist:   Skeet Latch, MD   Chief Complaint  Patient presents with  . New Evaluation    referred Dr Dr Suzi Roots for pulmonary HTN, pt denied chest pain and SOB, pt c/o swelling and discolor in feet      History of Present Illness: Justin Mcgee is a 82 y.o. male with PVCs, PACs and atrial tachycardia who is being seen today for the evaluation of dizziness and abnormal echo at the request of Ottis Stain*.  Justin Mcgee was seen by his PCP 11/2016 due to an episode of dizziness that resulted in a fall. At the time of his dizziness he had a bad sinus infection.  He was walking up the steps and got dizzy and hit his head on the wall.  He had also recently been prescribed tamsulosin and had hit his head after tripping and falling.  An echo and Holter were ordered.  Echo showed LVEF 60-65%, mild AR and mild MIR.  His RV was noted to be mildly elevated. He also wore a 48 hour Holter that showed PACs, PVCs, and atrial tachycardia.  He was referred to cardiology for further evaluation.  Justin Mcgee notes occasional palpitations with exertion.  There is no associated lightheadedness or dizziness.  He also denies exertional chest pain or pressure. He typically exercises twice per week for one hour each time. He reports mild lower extremity edema that improves with elevation. He denies orthopnea or PND. His last episode of dizziness was 2 weeks ago.  Justin Mcgee had an ETT 05/19/16 that was negative for ischemia. He quit smoking 40 years ago after a 30-pack-year history.   Past Medical History:  Diagnosis Date  . 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 Former smoker   . Fracture of right radius 1956  . Fracture, metacarpal 1956   1, 2, 3  . Fracture, tibia 1964  . 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 Marica Otter, no radiation  or chemo, clear LN.     Past Surgical History:  Procedure Laterality Date  . APPENDECTOMY  1997  . CATARACT EXTRACTION    . CHOLECYSTECTOMY  05/16/10   in [place] while on vacation  . [place]  . INGUINAL HERNIA REPAIR Left 1994  . PARTIAL GLOSSECTOMY     with unilat neck dissection     Current Outpatient Prescriptions  Medication Sig Dispense Refill  . augmented betamethasone dipropionate (DIPROLENE-AF) 0.05 % cream Apply 1 application topically daily.    . betamethasone dipropionate (DIPROLENE) 0.05 % cream Apply topically daily. 30 g 0  . gabapentin (NEURONTIN) 100 MG capsule Take 2 capsules (200 mg total) by mouth at bedtime. 180 capsule 3  . Omega-3 Fatty Acids (FISH OIL) 1000 MG CAPS Take 1 capsule by mouth daily.    . pantoprazole (PROTONIX) 40 MG tablet     . Saw Palmetto 450 MG CAPS Take 1 capsule by mouth daily.    [name] VITAMIN E PO Take by mouth.     No current facility-administered medications for this visit.     Allergies:   Sulfa antibiotics and Other    Social History:  The patient  reports that he quit smoking about 38 years ago. He has never used smokeless tobacco. He  reports that he does not drink alcohol or use drugs.   Family History:  The patient's family history includes Colon cancer in his mother; Diabetes in his father; GER disease in his father; Other in his father.    ROS:  Please see the history of present illness.   Otherwise, review of systems are positive for none.   All other systems are reviewed and negative.    PHYSICAL EXAM: VS:  BP 125/68   Pulse 61   Ht 6\' 3"  (1.905 m)   Wt 87.5 kg (192 lb 12.8 oz)   BMI 24.10 kg/m  , BMI Body mass index is 24.1 kg/m. GENERAL:  Well appearing HEENT:  Pupils equal round and reactive, fundi not visualized, oral mucosa unremarkable NECK:  No jugular venous distention, waveform within normal limits, carotid upstroke brisk and symmetric, no bruits, no  thyromegaly LYMPHATICS:  No cervical adenopathy LUNGS:  Clear to auscultation bilaterally HEART:  RRR.  PMI not displaced or sustained,S1 and S2 within normal limits, no S3, no S4, no clicks, no rubs, no  murmurs ABD:  Flat, positive bowel sounds normal in frequency in pitch, no bruits, no rebound, no guarding, no midline pulsatile mass, no hepatomegaly, no splenomegaly EXT:  2 plus pulses throughout, no edema, no cyanosis no clubbing SKIN:  No rashes no nodules NEURO:  Cranial nerves II through XII grossly intact, motor grossly intact throughout PSYCH:  Cognitively intact, oriented to person place and time    EKG:  EKG is not ordered today. The ekg ordered 11/17/16 demonstrates sinus rhythm. Rate 65 bpm.   Echo 01/06/17: Study Conclusions  - Left ventricle: The cavity size was normal. Systolic function was   normal. The estimated ejection fraction was in the range of 60%   to 65%. Wall motion was normal; there were no regional wall   motion abnormalities. Left ventricular diastolic function   parameters were normal. - Aortic valve: There was mild regurgitation. - Mitral valve: There was mild regurgitation. - Left atrium: The atrium was mildly dilated. - Right ventricle: RV mid and basal diameter is dilated on apical 4   chamber viewes. - Atrial septum: No defect or patent foramen ovale was identified. - Pulmonary arteries: PA peak pressure: 36 mm Hg (S).  ETT 05/19/16:  Blood pressure demonstrated a normal response to exercise.  There was no ST segment deviation noted during stress.  No T wave inversion was noted during stress.  Blood pressure demonstrated a normal response to exercise  Overall, the patient's exercise capacity was normal.  D[facility] Treadmill Score: low risk  Arrhythmias during stress: rare PVCs.   Recent Labs: 02/06/2016: ALT 12; TSH 1.89 06/02/2016: BUN 21; Creatinine, Ser 1.22; Potassium 4.0; Sodium 136 07/01/2016: Hemoglobin 13.2; Platelets 152    Lipid  Panel    Component Value Date/Time   CHOL 182 07/22/2015 0906   TRIG 180 (H) 07/22/2015 0906   HDL 35 (L) 07/22/2015 0906   CHOLHDL 5.2 (H) 07/22/2015 0906   VLDL 36 (H) 07/22/2015 0906   LDLCALC 111 07/22/2015 0906      Wt Readings from Last 3 Encounters:  02/01/17 87.5 kg (192 lb 12.8 oz)  01/13/17 89.4 kg (197 lb)  12/04/16 89.4 kg (197 lb)      ASSESSMENT AND PLAN:  # Dizziness: Symptoms have resolved. At the time he had a sinus infection, had recently hit his head, and was on tamsulosin.  Any of these could've caused his dizziness. Fortunately has since resolved. Blood pressure  is well-controlled and he denies orthostatic symptoms.  # Elevated pulmonary pressure: Justin Mcgee' PASP was 36 mmHg.  This is at the upper limit of normal.  PASP <40 mmHg is considered normal.  It may be slightly elevated due to prior smoking history.  He is completely asymptomatic. No further workup is indicated at this time.     Current medicines are reviewed at length with the patient today.  The patient does not have concerns regarding medicines.  The following changes have been made:  no change  Labs/ tests ordered today include:  No orders of the defined types were placed in this encounter.    Disposition:   FU with Ralonda Tartt C. Oval Linsey, MD, [facility] as needed    This note was written with the assistance of speech recognition software.  Please excuse any transcriptional errors.  Signed, Acey Woodfield C. Oval Linsey, MD, [facility]  02/01/2017 9:36 AM    [place]

## 2017-02-01 ENCOUNTER — Ambulatory Visit (INDEPENDENT_AMBULATORY_CARE_PROVIDER_SITE_OTHER): Payer: Medicare Other | Admitting: Cardiovascular Disease

## 2017-02-01 ENCOUNTER — Encounter: Payer: Self-pay | Admitting: Cardiovascular Disease

## 2017-02-01 VITALS — BP 125/68 | HR 61 | Ht 75.0 in | Wt 192.8 lb

## 2017-02-01 DIAGNOSIS — R42 Dizziness and giddiness: Secondary | ICD-10-CM | POA: Diagnosis not present

## 2017-02-01 DIAGNOSIS — I272 Pulmonary hypertension, unspecified: Secondary | ICD-10-CM

## 2017-02-01 DIAGNOSIS — R55 Syncope and collapse: Secondary | ICD-10-CM

## 2017-02-01 NOTE — Patient Instructions (Signed)
Medication Instructions:  ?Your physician recommends that you continue on your current medications as directed. Please refer to the Current Medication list given to you today.  ? ?Labwork: ?NONE ? ?Testing/Procedures: ?NONE ? ?Follow-Up: ?AS NEEDED  ? ?  ?

## 2017-02-02 ENCOUNTER — Other Ambulatory Visit: Payer: Self-pay | Admitting: Family Medicine

## 2017-02-02 ENCOUNTER — Ambulatory Visit (INDEPENDENT_AMBULATORY_CARE_PROVIDER_SITE_OTHER): Payer: Medicare Other | Admitting: Family Medicine

## 2017-02-02 VITALS — BP 119/64 | HR 68 | Wt 195.0 lb

## 2017-02-02 DIAGNOSIS — N401 Enlarged prostate with lower urinary tract symptoms: Secondary | ICD-10-CM | POA: Diagnosis not present

## 2017-02-02 DIAGNOSIS — Z125 Encounter for screening for malignant neoplasm of prostate: Secondary | ICD-10-CM

## 2017-02-02 DIAGNOSIS — R350 Frequency of micturition: Secondary | ICD-10-CM

## 2017-02-02 DIAGNOSIS — R42 Dizziness and giddiness: Secondary | ICD-10-CM

## 2017-02-02 DIAGNOSIS — R7301 Impaired fasting glucose: Secondary | ICD-10-CM | POA: Diagnosis not present

## 2017-02-02 DIAGNOSIS — M5417 Radiculopathy, lumbosacral region: Secondary | ICD-10-CM

## 2017-02-02 LAB — POCT GLYCOSYLATED HEMOGLOBIN (HGB A1C): Hemoglobin A1C: 5.8

## 2017-02-02 NOTE — Progress Notes (Signed)
Subjective:    CC: IFG   HPI:  Impaired fasting glucose-no increased thirst or urination. No symptoms consistent with hypoglycemia. He has not been exercising recently as he's had problems with dizziness and just had his cardiology evaluation yesterday and wanted to make sure that he was safe to do so.  Dizziness-seems to be completely resolved at this point. I think it was having a sinus infection complicated by recent start of an alpha-blocker for his BPH. He is off the medication and does not want to restart it.  BPH-he is not interested in restarting any type of medication at this point. We'll go ahead and add Flomax to his intolerance list as side effect causing dizziness.  Lumbosacral radiculopathy-currently on gabapentin. Well on current medication he feels like he is happy with his regimen and that it's working well.  Past medical history, Surgical history, Family history not pertinant except as noted below, Social history, Allergies, and medications have been entered into the medical record, reviewed, and corrections made.   Review of Systems: No fevers, chills, night sweats, weight loss, chest pain, or shortness of breath.   Objective:    General: Well Developed, well nourished, and in no acute distress.  Neuro: Alert and oriented x3, extra-ocular muscles intact, sensation grossly intact.  HEENT: Normocephalic, atraumatic  Skin: Warm and dry, no rashes. Cardiac: Regular rate and rhythm, no murmurs rubs or gallops, no lower extremity edema.  Respiratory: Clear to auscultation bilaterally. Not using accessory muscles, speaking in full sentences.   Impression and Recommendations:    IFG - Hemoglobin A1c up to 5.8 today which is a significant increase from previous of 5.2 back in October. Encouraged him to continue to work on healthy diet and regular exercise now that he's been cleared by cardiology and his dizziness has resolved. Follow-up in 6 months.  Lumbosacral  radiculopathy-continue gabapentin on current regimen. Follow-up in 6 months.  BPH-not interested in restarting medication at this point. Flomax added to intolerance list.  Dizziness-has resolved.

## 2017-02-05 DIAGNOSIS — R7301 Impaired fasting glucose: Secondary | ICD-10-CM | POA: Diagnosis not present

## 2017-02-05 DIAGNOSIS — Z125 Encounter for screening for malignant neoplasm of prostate: Secondary | ICD-10-CM | POA: Diagnosis not present

## 2017-02-05 DIAGNOSIS — R350 Frequency of micturition: Secondary | ICD-10-CM | POA: Diagnosis not present

## 2017-02-05 DIAGNOSIS — M5417 Radiculopathy, lumbosacral region: Secondary | ICD-10-CM | POA: Diagnosis not present

## 2017-02-05 DIAGNOSIS — N401 Enlarged prostate with lower urinary tract symptoms: Secondary | ICD-10-CM | POA: Diagnosis not present

## 2017-02-06 LAB — LIPID PANEL
CHOLESTEROL: 154 mg/dL (ref <200)
HDL: 42 mg/dL (ref >40)
LDL Cholesterol: 89 mg/dL (ref <100)
Total CHOL/HDL Ratio: 3.7 Ratio (ref <5.0)
Triglycerides: 113 mg/dL (ref <150)
VLDL: 23 mg/dL (ref <30)

## 2017-02-06 LAB — COMPLETE METABOLIC PANEL WITH GFR
ALK PHOS: 67 U/L (ref 40–115)
ALT: 12 U/L (ref 9–46)
AST: 17 U/L (ref 10–35)
Albumin: 4.2 g/dL (ref 3.6–5.1)
BILIRUBIN TOTAL: 0.5 mg/dL (ref 0.2–1.2)
BUN: 14 mg/dL (ref 7–25)
CO2: 27 mmol/L (ref 20–31)
Calcium: 9.2 mg/dL (ref 8.6–10.3)
Chloride: 108 mmol/L (ref 98–110)
Creat: 1.06 mg/dL (ref 0.70–1.11)
GFR, EST NON AFRICAN AMERICAN: 66 mL/min (ref >=60)
GFR, Est African American: 76 mL/min (ref >=60)
GLUCOSE: 111 mg/dL — AB (ref 65–99)
Potassium: 4.9 mmol/L (ref 3.5–5.3)
SODIUM: 145 mmol/L (ref 135–146)
TOTAL PROTEIN: 6.6 g/dL (ref 6.1–8.1)

## 2017-02-06 LAB — PSA: PSA: 4.7 ng/mL — ABNORMAL HIGH (ref <=4.0)

## 2017-08-01 ENCOUNTER — Other Ambulatory Visit: Payer: Self-pay | Admitting: Family Medicine

## 2017-08-04 ENCOUNTER — Ambulatory Visit (INDEPENDENT_AMBULATORY_CARE_PROVIDER_SITE_OTHER): Payer: Medicare Other | Admitting: Family Medicine

## 2017-08-04 DIAGNOSIS — Z23 Encounter for immunization: Secondary | ICD-10-CM

## 2017-10-08 ENCOUNTER — Encounter: Payer: Self-pay | Admitting: Family Medicine

## 2017-10-08 DIAGNOSIS — H524 Presbyopia: Secondary | ICD-10-CM | POA: Diagnosis not present

## 2017-10-08 DIAGNOSIS — H26491 Other secondary cataract, right eye: Secondary | ICD-10-CM | POA: Diagnosis not present

## 2017-11-11 ENCOUNTER — Encounter: Payer: Self-pay | Admitting: Family Medicine

## 2017-11-11 DIAGNOSIS — H26491 Other secondary cataract, right eye: Secondary | ICD-10-CM | POA: Diagnosis not present

## 2018-01-28 ENCOUNTER — Other Ambulatory Visit: Payer: Self-pay | Admitting: Family Medicine

## 2018-02-03 ENCOUNTER — Encounter: Payer: Self-pay | Admitting: Family Medicine

## 2018-02-03 ENCOUNTER — Ambulatory Visit (INDEPENDENT_AMBULATORY_CARE_PROVIDER_SITE_OTHER): Payer: Medicare Other | Admitting: Family Medicine

## 2018-02-03 VITALS — BP 117/60 | HR 68 | Ht 75.0 in | Wt 196.0 lb

## 2018-02-03 DIAGNOSIS — R5383 Other fatigue: Secondary | ICD-10-CM

## 2018-02-03 DIAGNOSIS — Z0000 Encounter for general adult medical examination without abnormal findings: Secondary | ICD-10-CM | POA: Diagnosis not present

## 2018-02-03 DIAGNOSIS — L308 Other specified dermatitis: Secondary | ICD-10-CM | POA: Diagnosis not present

## 2018-02-03 DIAGNOSIS — Z5181 Encounter for therapeutic drug level monitoring: Secondary | ICD-10-CM | POA: Diagnosis not present

## 2018-02-03 DIAGNOSIS — E538 Deficiency of other specified B group vitamins: Secondary | ICD-10-CM

## 2018-02-03 DIAGNOSIS — N401 Enlarged prostate with lower urinary tract symptoms: Secondary | ICD-10-CM | POA: Diagnosis not present

## 2018-02-03 DIAGNOSIS — R350 Frequency of micturition: Secondary | ICD-10-CM

## 2018-02-03 DIAGNOSIS — M5417 Radiculopathy, lumbosacral region: Secondary | ICD-10-CM

## 2018-02-03 DIAGNOSIS — Z79899 Other long term (current) drug therapy: Secondary | ICD-10-CM | POA: Diagnosis not present

## 2018-02-03 NOTE — Patient Instructions (Addendum)

## 2018-02-03 NOTE — Progress Notes (Addendum)
Subjective:   Justin Mcgee is a 83 y.o. male who presents for Medicare Annual/Subsequent preventive examination.  Review of Systems:  review of systems is negative except for HPI.      He c/o of bilateral knee pain.  He says it is not bad enough he wants any further w/u at this time.   Says has new areas of rash on both thighs and right lower leg. Similar to rash he had about a year ago on his abdomen and upper back. 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  Negative KOH. He says the rash has been present for 3-4 weeks and it getting better. He is not putting anything on it.    lumboscaral neuropathy - He stopped his gabapentin. He felt it was making him more unsteady. He has felt much better off of it.  He is now just using Tylenol PM, 2 tabs and that seems to be working just as well.       Objective:    Vitals: BP 117/60   Pulse 68   Ht 6\' 3"  (1.905 m)   Wt 196 lb (88.9 kg)   SpO2 96%   BMI 24.50 kg/m   Body mass index is 24.5 kg/m.  Advanced Directives 06/01/2016 08/12/2015 07/17/2015 03/02/2014 01/18/2014  Does Patient Have a Medical Advance Directive? No No No Patient does not have advance directive;Patient would like information Patient does not have advance directive;Patient would like information  Would patient like information on creating a medical advance directive? - No - patient declined information No - patient declined information;Yes - Scientist, clinical (histocompatibility and immunogenetics) given Advance directive brochure given (Outpatient ONLY) Advance directive packet given    Tobacco Social History   Tobacco Use  Smoking Status Former Smoker  . Last attempt to quit: 02/19/1978  . Years since quitting: 39.9  Smokeless Tobacco Never Used     Counseling given: Not Answered   Clinical Intake:           Physical Exam  Constitutional: He is oriented to person, place, and time. He appears well-developed and well-nourished.  HENT:  Head: Normocephalic and atraumatic.   Cardiovascular: Normal rate, regular rhythm and normal heart sounds.  No carotid bruits.   Pulmonary/Chest: Effort normal and breath sounds normal.  Musculoskeletal:  1+ reflexes in UE and LE  Neurological: He is alert and oriented to person, place, and time.  Skin: Skin is warm and dry. Rash noted.  erythematous dry scaling well demarcated oval shaped rash on his right inner leg.   Psychiatric: He has a normal mood and affect. His behavior is normal.                Past Medical History:  Diagnosis Date  . 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 Former smoker   . Fracture of right radius 1956  . Fracture, metacarpal 1956   1, 2, 3  . Fracture, tibia 1964  . Squamous cell cancer of buccal mucosa (Justin Mcge[place]) 2004   Dr. Marica Otter, no radiation or chemo, clear LN.    Past Surgical History:  Procedure Laterality Date  . APPENDECTOMY  1997  . CATARACT EXTRACTION    . CHOLECYSTECTOMY  05/16/10   in [place] while on vacation  . [place]  . INGUINAL HERNIA REPAIR Left 1994  . PARTIAL GLOSSECTOMY     with unilat neck dissection   Family History  Problem Relation Age  of Onset  . GER disease Father   . Other Father        Justin Mcgee  . Diabetes Father   . Colon cancer Mother   . Diabetes Unknown    Social History   Socioeconomic History  . Marital status: Married    Spouse name: Justin Mcgee   . Number of children: 2  . Years of education: Not on file  . Highest education level: Not on file  Occupational History  . 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 Financial resource strain: Not on file  . Food insecurity:    Worry: Not on file    Inability: Not on file  . Transportation needs:    Medical: Not on file    Non-medical: Not on file  Tobacco Use  . Smoking status: Former Smoker    Last attempt to quit: 02/19/1978    Years since quitting: 39.9  . Smokeless tobacco: Never Used   Substance and Sexual Activity  . Alcohol use: No    Alcohol/week: 0.0 oz  . Drug use: No  . Sexual activity: Yes    Partners: Female  Lifestyle  . Physical activity:    Days per week: Not on file    Minutes per session: Not on file  . Stress: Not on file  Relationships  . Social connections:    Talks on phone: Not on file    Gets together: Not on file    Attends religious service: Not on file    Active member of club or organization: Not on file    Attends meetings of clubs or organizations: Not on file    Relationship status: Not on file  Other Topics Concern  . Not on file  Social History Narrative   4 caffeine drink per day.  40 min cardio/strength 6 days per week. Retired from [employer].              Outpatient Encounter Medications as of 02/03/2018  Medication Sig  . Ascorbic Acid (VITAMIN C PO) Take by mouth.  . Cyanocobalamin (VITAMIN B-12 PO) Take by mouth.  . pantoprazole (PROTONIX) 40 MG tablet   . Saw Palmetto 450 MG CAPS Take 1 capsule by mouth daily.  [name] VITAMIN E PO Take by mouth.  . [DISCONTINUED] gabapentin (NEURONTIN) 100 MG capsule Take 2 capsules (200 mg total) by mouth at bedtime.  . [DISCONTINUED] Omega-3 Fatty Acids (FISH OIL) 1000 MG CAPS Take 1 capsule by mouth daily.  . [DISCONTINUED] pantoprazole (PROTONIX) 40 MG tablet TAKE 1 TABLET TWICE A DAY  . [DISCONTINUED] pantoprazole (PROTONIX) 40 MG tablet TAKE 1 TABLET TWICE A DAY   No facility-administered encounter medications on file as of 02/03/2018.     Activities of Daily Living In your present state of health, do you have any difficulty performing the following activities: 02/03/2018  Hearing? N  Vision? N  Difficulty concentrating or making decisions? N  Walking or climbing stairs? N  Dressing or bathing? N  Doing errands, shopping? N  Some recent data might be hidden    Patient Care Team: Hali Marry, MD as PCP - General (Family Medicine)   Assessment:   This is a  routine wellness examination for Justin Mcgee.  Exercise Activities and Dietary recommendations Current Exercise Habits: Home exercise routine, Time (Minutes): 60, Frequency (Times/Week): 3, Weekly Exercise (Minutes/Week): 180  Goals    None      Fall Risk Fall Risk  02/03/2018 11/19/2016 07/17/2015 10/20/2013  Falls in the past year? No No No No    Depression Screen PHQ 2/9 Scores 02/03/2018 02/02/2017 01/13/2017 11/19/2016  PHQ - 2 Score 0 0 0 0    Cognitive Function     6CIT Screen 02/03/2018  What Year? 0 points  What month? 0 points  What time? 0 points  Count back from 20 0 points  Months in reverse 0 points  Repeat phrase 0 points  Total Score 0    Immunization History  Administered Date(s) Administered  . Influenza Split 09/18/2011  . Influenza, High Dose Seasonal PF 08/04/2017  . Influenza,inj,Quad PF,6+ Mos 07/17/2015, 07/01/2016  . Pneumococcal Conjugate-13 07/17/2015  . Pneumococcal-Unspecified 06/04/2009  . Td 10/13/2007    Qualifies for Shingles Vaccine?  Yes  Screening Tests Health Maintenance  Topic Date Due  . TETANUS/TDAP  10/12/2017  . INFLUENZA VACCINE  05/12/2018  . PNA vac Low Risk Adult  Completed   Cancer Screenings: Lung: Low Dose CT Chest recommended if Age 13-80 years, 30 pack-year currently smoking OR have quit w/in 15years. Patient does not qualify. Colorectal: UTD, due 2020.   Additional Screenings:  Hepatitis C Screening: not needed.        Plan:   Medicare WEllness Exam   I have personally reviewed and noted the following in the patient's chart:   . Medical and social history - updated.  . Use of alcohol, tobacco or illicit drugs  . Current medications and supplements - updated. Removed gabapentin.  . Functional ability and status . Nutritional status . Physical activity - keep up activity.  . Advanced directives . List of other physicians - no other.  [name] Hospitalizations, surgeries, and ER visits in previous 12  months . Vitals . Screenings to include cognitive, depression, and falls . Referrals and appointments . Rash - recommend come in for skin biopsy if new lesion.  . Fatigue/ started OTC  B12. Will check level.     In addition, I have reviewed and discussed with patient certain preventive protocols, quality metrics, and best practice recommendations. A written personalized care plan for preventive services as well as general preventive health recommendations were provided to patient.     Beatrice Lecher, MD  02/03/2018

## 2018-02-09 DIAGNOSIS — Z79899 Other long term (current) drug therapy: Secondary | ICD-10-CM | POA: Diagnosis not present

## 2018-02-09 DIAGNOSIS — N401 Enlarged prostate with lower urinary tract symptoms: Secondary | ICD-10-CM | POA: Diagnosis not present

## 2018-02-09 DIAGNOSIS — Z5181 Encounter for therapeutic drug level monitoring: Secondary | ICD-10-CM | POA: Diagnosis not present

## 2018-02-09 DIAGNOSIS — R350 Frequency of micturition: Secondary | ICD-10-CM | POA: Diagnosis not present

## 2018-02-09 DIAGNOSIS — R5383 Other fatigue: Secondary | ICD-10-CM | POA: Diagnosis not present

## 2018-02-09 DIAGNOSIS — E538 Deficiency of other specified B group vitamins: Secondary | ICD-10-CM | POA: Diagnosis not present

## 2018-02-09 LAB — COMPLETE METABOLIC PANEL WITH GFR
AG RATIO: 1.6 (calc) (ref 1.0–2.5)
ALT: 11 U/L (ref 9–46)
AST: 14 U/L (ref 10–35)
Albumin: 4.2 g/dL (ref 3.6–5.1)
Alkaline phosphatase (APISO): 79 U/L (ref 40–115)
BILIRUBIN TOTAL: 0.6 mg/dL (ref 0.2–1.2)
BUN/Creatinine Ratio: 14 (calc) (ref 6–22)
BUN: 17 mg/dL (ref 7–25)
CALCIUM: 9.3 mg/dL (ref 8.6–10.3)
CHLORIDE: 106 mmol/L (ref 98–110)
CO2: 31 mmol/L (ref 20–32)
Creat: 1.22 mg/dL — ABNORMAL HIGH (ref 0.70–1.11)
GFR, EST AFRICAN AMERICAN: 64 mL/min/{1.73_m2} (ref >=60)
GFR, EST NON AFRICAN AMERICAN: 55 mL/min/{1.73_m2} — AB (ref >=60)
Globulin: 2.6 g/dL (calc) (ref 1.9–3.7)
Glucose, Bld: 122 mg/dL — ABNORMAL HIGH (ref 65–99)
POTASSIUM: 4.8 mmol/L (ref 3.5–5.3)
SODIUM: 141 mmol/L (ref 135–146)
TOTAL PROTEIN: 6.8 g/dL (ref 6.1–8.1)

## 2018-02-09 LAB — LIPID PANEL
Cholesterol: 168 mg/dL (ref <200)
HDL: 42 mg/dL (ref >40)
LDL Cholesterol (Calc): 103 mg/dL (calc) — ABNORMAL HIGH
NON-HDL CHOLESTEROL (CALC): 126 mg/dL (ref <130)
TRIGLYCERIDES: 131 mg/dL (ref <150)
Total CHOL/HDL Ratio: 4 (calc) (ref <5.0)

## 2018-02-09 LAB — PSA: PSA: 5.8 ng/mL — AB (ref <=4.0)

## 2018-02-10 LAB — VITAMIN B12: VITAMIN B 12: 1035 pg/mL (ref 200–1100)

## 2018-05-02 ENCOUNTER — Ambulatory Visit (INDEPENDENT_AMBULATORY_CARE_PROVIDER_SITE_OTHER): Payer: Medicare Other | Admitting: Physician Assistant

## 2018-05-02 ENCOUNTER — Encounter: Payer: Self-pay | Admitting: Physician Assistant

## 2018-05-02 VITALS — BP 135/70 | HR 65 | Temp 97.8°F | Wt 193.0 lb

## 2018-05-02 DIAGNOSIS — B078 Other viral warts: Secondary | ICD-10-CM

## 2018-05-02 DIAGNOSIS — S30860A Insect bite (nonvenomous) of lower back and pelvis, initial encounter: Secondary | ICD-10-CM | POA: Diagnosis not present

## 2018-05-02 DIAGNOSIS — W57XXXA Bitten or stung by nonvenomous insect and other nonvenomous arthropods, initial encounter: Secondary | ICD-10-CM | POA: Diagnosis not present

## 2018-05-02 MED ORDER — DOXYCYCLINE HYCLATE 100 MG PO TABS: 100.0000 mg | ORAL_TABLET | Freq: Two times a day (BID) | ORAL | 0 refills | Status: DC

## 2018-05-02 NOTE — Patient Instructions (Signed)

## 2018-05-02 NOTE — Progress Notes (Signed)
HPI:                                                                Justin Mcgee is a 83 y.o. male who presents to [place]: [place] today for tick bite  Reports tick bite of left hip yesterday. Believes tick was attached to him for about 8 hours. He removed it and brought it to his appointment today stating it is a deer tick Reports tactile fever and chills last night. Denies fever, chills, headache today.  Also complains of a chronic wart on his left forearm present for many months. Has tried OTC wart removal without success.  Depression screen [facility] 2/9 02/03/2018 02/02/2017 01/13/2017 11/19/2016 07/17/2015  Decreased Interest 0 0 0 0 0  Down, Depressed, Hopeless 0 0 0 0 0  PHQ - 2 Score 0 0 0 0 0    GAD 7 : Generalized Anxiety Score 02/03/2018  Nervous, Anxious, on Edge 0  Control/stop worrying 0  Worry too much - different things 0  Trouble relaxing 0  Restless 0  Easily annoyed or irritable 0  Afraid - awful might happen 0  Total GAD 7 Score 0  Anxiety Difficulty Not difficult at all      Past Medical History:  Diagnosis Date  . 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 Former smoker   . Fracture of right radius 1956  . Fracture, metacarpal 1956   1, 2, 3  . Fracture, tibia 1964  . Squamous cell cancer of buccal mucosa ([place]) 2004   Dr. Marica Otter, no radiation or chemo, clear LN.    Past Surgical History:  Procedure Laterality Date  . APPENDECTOMY  1997  . CATARACT EXTRACTION    . CHOLECYSTECTOMY  05/16/10   in [place] while on vacation  . [place]  . INGUINAL HERNIA REPAIR Left 1994  . PARTIAL GLOSSECTOMY     with unilat neck dissection   Social History   Tobacco Use  . Smoking status: Former Smoker    Last attempt to quit: 02/19/1978    Years since quitting: 40.2  . Smokeless tobacco: Never Used  Substance Use Topics  . Alcohol use: No    Alcohol/week: 0.0 oz    family history includes Colon cancer in his mother; Diabetes in his father and unknown relative; GER disease in his father; Other in his father.    ROS: negative except as noted in the HPI  Medications: Current Outpatient Medications  Medication Sig Dispense Refill  . Ascorbic Acid (VITAMIN C PO) Take by mouth.    . Cyanocobalamin (VITAMIN B-12 PO) Take by mouth.    . pantoprazole (PROTONIX) 40 MG tablet     . Saw Palmetto 450 MG CAPS Take 1 capsule by mouth daily.    [name] VITAMIN E PO Take by mouth.     No current facility-administered medications for this visit.    Allergies  Allergen Reactions  . Flomax [Tamsulosin Hcl] Other (See Comments)    Dizziness  . Gabapentin Other (See Comments)    Unsteadiness and Balance problems.    . Sulfa Antibiotics   . Other Other (See Comments)    Topical  sulfa only. Gets skin irritation.       Objective:  BP 135/70   Pulse 65   Temp 97.8 F (36.6 C) (Oral)   Wt 193 lb (87.5 kg)   BMI 24.12 kg/m  Gen:  alert, not ill-appearing, no distress, appropriate for age 77: head normocephalic without obvious abnormality, conjunctiva and cornea clear, trachea midline Pulm: Normal work of breathing, normal phonation Neuro: alert and oriented x 3, no tremor MSK: extremities atraumatic, normal gait and station Skin: left buttock/flank there is a 1 mm insect bite with surrounding erythema, slightly indurated; extensor aspect of left distal forearm there is approx 1.2 cm hyperkaratotic lesion with multiple black centers Psych: well-groomed, cooperative, good eye contact, euthymic mood, affect mood-congruent, speech is articulate, and thought processes clear and goal-directed  Procedure: Cryodestruction of wart, left forearm Indication: pain Risks and benefits of procedures discussed. Verbal consent obtained Area examined and noted no overlying erythema, induration, or other signs of local skin infection. Using cryospray method, the canister  was held in an upright position with the cryotip approximately 1 cm away from the lesion.  Freezing performed using liquid nitrogen until 1-2 mm rim of frost appeared around the lesion.  Freeze-thaw cycle repeated once.  Follow-up: The patient tolerated the procedure well without complications.  Standard post-procedure care is explained and return precautions are given. Advised to return in 1-2 weeks for repeat cryodestruction if lesion is still present/symptomatic.   No results found for this or any previous visit (from the past 72 hour(s)). No results found.    Assessment and Plan: 83 y.o. male with   Tick bite of buttock, initial encounter - Plan: doxycycline (VIBRA-TABS) 100 MG tablet  Other viral warts  - tick appears engorged. Tick appears to be a blacklegged nymph. Tick bite does not appear infected. Given patient was symptomatic after the initial bite with subjective fever and malaise, will cover with Doxycycline bid x 14 days  - cryodestruction of wart performed as above. Return in 2 weeks for repeat cryo  Patient education and anticipatory guidance given Patient agrees with treatment plan   Darlyne Russian PA-C

## 2018-05-16 ENCOUNTER — Ambulatory Visit (INDEPENDENT_AMBULATORY_CARE_PROVIDER_SITE_OTHER): Payer: Medicare Other | Admitting: Physician Assistant

## 2018-05-16 ENCOUNTER — Encounter: Payer: Self-pay | Admitting: Physician Assistant

## 2018-05-16 VITALS — BP 146/68 | HR 57 | Ht 72.0 in | Wt 195.0 lb

## 2018-05-16 DIAGNOSIS — R21 Rash and other nonspecific skin eruption: Secondary | ICD-10-CM | POA: Diagnosis not present

## 2018-05-16 DIAGNOSIS — L300 Nummular dermatitis: Secondary | ICD-10-CM | POA: Diagnosis not present

## 2018-05-16 MED ORDER — CLOTRIMAZOLE-BETAMETHASONE 1-0.05 % EX CREA: 1.0000 "application " | TOPICAL_CREAM | Freq: Two times a day (BID) | CUTANEOUS | 0 refills | Status: DC

## 2018-05-16 NOTE — Progress Notes (Signed)
Procedure: Cryodestruction of verrucae, bilateral forearms Indication: viral infection Risks and benefits of procedures discussed. Verbal consent obtained Area examined and noted no overlying erythema, induration, or other signs of local skin infection. Using cryospray method, the canister was held in an upright position with the cryotip approximately 1 cm away from the lesion.  Freezing performed using liquid nitrogen until 1-2 mm rim of frost appeared around the lesion.  Freeze-thaw cycle repeated once.    Follow-up: The patient tolerated the procedure well without complications.  Standard post-procedure care is explained and return precautions are given. Advised to return in 1-2 weeks for repeat cryodestruction if lesion is still present/symptomatic.     Darlyne Russian PA-C

## 2018-05-16 NOTE — Patient Instructions (Signed)
Nummular Eczema Nummular eczema is a common disease that causes red, circular, crusted (plaque) lesions that may be itchy. It most commonly affects the lower legs and the backs of hands. Men tend to get their first outbreak between 63 and 81 years of age, and women tend to get their first outbreak during their teen or young adult years. What are the causes? The cause of this condition is not known. It may be related to certain skin sensitivities, such as sensitivity to:  Metals such as nickel and, rarely, mercury.  Formaldehyde.  Antibiotic medicine that is applied to the skin, such as neomycin.  What increases the risk? You are more likely to develop this condition if:  You have very dry skin.  You live in a place with dry and cold weather.  You have a personal or family history of eczema, asthma, or allergies.  You drink alcohol.  You have poor blood flow (circulation).  What are the signs or symptoms? Symptoms most commonly affect the lower legs, but may also affect the hands, torso, arms, or feet. Symptoms include:  Groups of tiny, red spots.  Blister-like sores that leak fluid. These sores may grow together and form circular patches. After a long time, they may become crusty and then scaly.  Well-defined patches of pink, red, or brown skin.  Itchiness and burning, ranging from mild to severe. Itchiness may be worse at night and may cause trouble sleeping. Scratching lesions can cause bleeding.  How is this diagnosed? This condition is diagnosed based on a physical exam and your medical history. Usually more tests are not needed, but you may need a swab test to check for skin infection. This test involves swabbing an affected area and testing the sample for bacteria (culture). You may work with a health care provider who specializes in the skin (dermatologist) to help diagnose and treat this condition. How is this treated? There is no cure for this condition, but treatment  can help relieve symptoms. Depending on how severe your symptoms are, your healthcare provider may suggest:  Medicine applied to the skin to reduce swelling and irritation (topical corticosteroids).  Medicine taken by mouth to reduce itching (oral antihistamines).  Antibiotic medicine to take by mouth (oral antibiotic) or to apply to your skin (topical antibiotic), if you have a skin infection.  Light therapy (phototherapy). This involves shining ultraviolet (UV) light on affected skin to reduce itchiness and inflammation.  Soaking in a bath that contains a type of salt that dries out blisters (potassium permanganate soaks).  Follow these instructions at home: Medicines  Take over-the-counter and prescription medicines only as told by your health care provider.  If you were prescribed an antibiotic, take or apply it as told by your health care provider. Do not stop using the antibiotic even if you start to feel better. Skin Care  Keep your fingernails short to avoid breaking open the skin when you scratch.  Wash your hands with mild soap and water to avoid infection.  Pat your skin dry after bathing or washing your hands. Avoid rubbing your skin.  Keep your skin hydrated. To do this: ? Avoid very hot water. Take lukewarm baths or showers. ? Apply moisturizer within three minutes of bathing. This locks in moisture. ? Use a humidifier when you have the heating or air conditioning on. This will add moisture to the air.  Identify and avoid things that trigger symptoms or irritate your skin. Triggers may include taking long, hot showers  or baths, or not using creams or ointments to moisturize. Certain soaps may also trigger this condition. General instructions  Dress in clothes made of cotton or cotton blends. Avoid wearing clothes with wool fabric.  Avoid activities that may cause skin injury. Wear protective clothing when doing outdoor activities such as gardening or hiking. Cuts,  scrapes, and insect bites can make symptoms worse.  Keep all follow-up visits as told by your health care provider. This is important. Contact a health care provider if:  You develop a yellowish crust on an area of affected skin.  You have symptoms that do not go away with treatment or home care methods. Get help right away if:  You have more redness, pain, pus, or swelling. Summary  Nummular eczema is a common disease that causes red, circular, crusted (plaque) lesions that may be itchy.  The cause of this condition is not known. It may be related to certain skin sensitivities.  Treatments may include medicines to reduce swelling and irritation, avoiding triggers, and keeping your skin hydrated. This information is not intended to replace advice given to you by your health care provider. Make sure you discuss any questions you have with your health care provider. Document Released: 02/11/2017 Document Revised: 02/11/2017 Document Reviewed: 02/11/2017 Elsevier Interactive Patient Education  2018 [employer].

## 2018-05-16 NOTE — Progress Notes (Signed)
   Subjective:    Patient ID: Justin Mcgee, male    DOB: 1934/11/06, 83 y.o.   MRN: [id number]  HPI Pt is a 83 yo male presenting to the clinic here for a tick bite follow up.   Pt was here 05/02/18 for tick bite on his left upper buttock with tactile fever/chills and was started on Doxycyline x 14 days. Finished his antibiotic today with no problems. Denies fever, chills, HA, myalgia or any other other symptoms   He also had a chronic wart on his left forearm he wanted removed. The wart was cryodestructed at the time and is healing well. Today he currently reports new warts on bilateral forearms that he wants removed. The wart that was previously cryodestructed has healed well.  Pt also reports a recurrent rash on his right upper back that has been coming and going for 1 year now. Rash is pruritic and scaly. He has had skin scraping performed, which was negative for fungal elements and a bx showing spongiotic dermatitis.  He was treated with topical steroid and reports this was not helpful. He reports Terbinafine cream was helpful, but rash never fully resolved.     Review of Systems  Constitutional: Negative for chills, diaphoresis and fever.  HENT: Negative.   Eyes: Negative.   Respiratory: Negative.  Negative for cough.   Cardiovascular: Negative.  Negative for chest pain and palpitations.  Musculoskeletal: Negative for arthralgias and myalgias.  Skin: Positive for rash.       Objective:   Physical Exam  Constitutional: He appears well-developed and well-nourished. No distress.  HENT:  Head: Normocephalic and atraumatic.  Eyes: Pupils are equal, round, and reactive to light. Conjunctivae and EOM are normal.  Neck: Normal range of motion.  Cardiovascular: Normal rate, regular rhythm and normal heart sounds.  Pulmonary/Chest: Effort normal and breath sounds normal.  Musculoskeletal: Normal range of motion.  Neurological: He is alert.  Skin: Skin is warm and dry. Rash noted.      Left upper buttock: two, discrete approx 1-2 mm macules without ulceration or surrounding erythema Right upper back well-circumscribed annular scaly patch measuring approx 2 cm Right extensor forearm and left flexor forearm: Hyperkeratotic papules approx 0.5 cm on      Assessment & Plan:  Marland KitchenMarland KitchenKhyson was seen today for follow-up.  Diagnoses and all orders for this visit:  Rash and nonspecific skin eruption -     clotrimazole-betamethasone (LOTRISONE) cream; Apply 1 application topically 2 (two) times daily.  Nummular eczematous dermatitis   -Cryodestruction of wart performed (see procedure note). Return in 2 weeks for repeat cryo as needed.   -Tick bite area: site is healing great. No evidence of infection. Denies fever, chills, myalgia or HA.  -Rash on upper back: has fungal appearance and responds to topical terbinafine, but given prior biopsy showing spongiotic dermatitis, I suspect this may be nummular eczema. Lotrisone bid x 2 weeks.  Patient education and anticipatory guidance given Patient agrees with treatment plan Follow-up in 2 weeks for rash and cryotherapy or sooner as needed  Darlyne Russian PA-C

## 2018-06-03 ENCOUNTER — Encounter: Payer: Self-pay | Admitting: Physician Assistant

## 2018-06-03 ENCOUNTER — Ambulatory Visit (INDEPENDENT_AMBULATORY_CARE_PROVIDER_SITE_OTHER): Payer: Medicare Other | Admitting: Physician Assistant

## 2018-06-03 VITALS — BP 144/74 | HR 58 | Wt 195.0 lb

## 2018-06-03 DIAGNOSIS — M25562 Pain in left knee: Secondary | ICD-10-CM | POA: Diagnosis not present

## 2018-06-03 DIAGNOSIS — R21 Rash and other nonspecific skin eruption: Secondary | ICD-10-CM | POA: Diagnosis not present

## 2018-06-03 NOTE — Progress Notes (Signed)
HPI:                                                                Justin Mcgee is a 83 y.o. male who presents to [facility] [place] today for rash follow-up   Pt also reports a recurrent rash on his right upper back that has been coming and going for 1 year now. Rash is pruritic and scaly. He has had skin scraping performed, which was negative for fungal elements and a bx showing spongiotic dermatitis.  He was treated with topical steroid and reports this was not helpful. He reports Terbinafine cream was helpful, but rash never fully resolved.   He was re-treated with topical Lotrisone x 2 weeks. Notes subjective improvement, still has some itching.  Additionally c/o left knee pain for 1 week. Reports lateral knee pain with bending his knee such as resting it on the ground to lift an object. Denies known injury or trauma. Pain only occurs with bending and pressure on the knee. Denies swelling.  Depression screen [facility] 2/9 02/03/2018 02/02/2017 01/13/2017 11/19/2016 07/17/2015  Decreased Interest 0 0 0 0 0  Down, Depressed, Hopeless 0 0 0 0 0  PHQ - 2 Score 0 0 0 0 0    GAD 7 : Generalized Anxiety Score 02/03/2018  Nervous, Anxious, on Edge 0  Control/stop worrying 0  Worry too much - different things 0  Trouble relaxing 0  Restless 0  Easily annoyed or irritable 0  Afraid - awful might happen 0  Total GAD 7 Score 0  Anxiety Difficulty Not difficult at all      Past Medical History:  Diagnosis Date  . 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 Former smoker   . Fracture of right radius 1956  . Fracture, metacarpal 1956   1, 2, 3  . Fracture, tibia 1964  . 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 Marica Otter, no radiation or chemo, clear LN.    Past Surgical History:  Procedure Laterality Date  . APPENDECTOMY  1997  . CATARACT EXTRACTION    . CHOLECYSTECTOMY  05/16/10   in [place] while on vacation  .  [place]  . INGUINAL HERNIA REPAIR Left 1994  . PARTIAL GLOSSECTOMY     with unilat neck dissection   Social History   Tobacco Use  . Smoking status: Former Smoker    Last attempt to quit: 02/19/1978    Years since quitting: 40.3  . Smokeless tobacco: Never Used  Substance Use Topics  . Alcohol use: No    Alcohol/week: 0.0 standard drinks   family history includes Colon cancer in his mother; Diabetes in his father and unknown relative; GER disease in his father; Other in his father.    ROS: negative except as noted in the HPI  Medications: Current Outpatient Medications  Medication Sig Dispense Refill  . Ascorbic Acid (VITAMIN C PO) Take by mouth.    . clotrimazole-betamethasone (LOTRISONE) cream Apply 1 application topically 2 (two) times daily. 30 g 0  . Cyanocobalamin (VITAMIN B-12 PO) Take by mouth.    . pantoprazole (PROTONIX) 40 MG tablet     .  Saw Palmetto 450 MG CAPS Take 1 capsule by mouth daily.    [name] VITAMIN E PO Take by mouth.     No current facility-administered medications for this visit.    Allergies  Allergen Reactions  . Flomax [Tamsulosin Hcl] Other (See Comments)    Dizziness  . Gabapentin Other (See Comments)    Unsteadiness and Balance problems.    . Sulfa Antibiotics   . Other Other (See Comments)    Topical sulfa only. Gets skin irritation.       Objective:  BP (!) 144/74   Pulse (!) 58   Wt 195 lb (88.5 kg)   BMI 26.45 kg/m  Gen:  alert, not ill-appearing, no distress, appropriate for age 37: head normocephalic without obvious abnormality, conjunctiva and cornea clear, trachea midline Pulm: Normal work of breathing, normal phonation Neuro: alert and oriented x 3, no tremor MSK: extremities atraumatic, normal gait and station Left knee: atraumatic, no effusion, lateral joint line tenderness, no crepitus, full active ROM Skin: approx 2 cm annular hyperpigmented plaque on right shoulder with some fine  scale    No results found for this or any previous visit (from the past 72 hour(s)). No results found.    Assessment and Plan: 83 y.o. male with   .Jujhar was seen today for follow-up.  Diagnoses and all orders for this visit:  Nummular eczematous dermatitis  Acute pain of left knee   Rash: prior bx showing spongiform dermatitis, no fungal elements. He was treated with Lotrisone for this reason. Rash has improved. Discussed that this will likely flare and remit and to keep skin moisturized and self-treat with Lotrisone bid prn   Atraumatic left lateral knee pain: OA v meniscal injury. Conservative management with RICE, NSAID prn. Declines X-rays today. Follow-up with Sports Medicine prn  Patient education and anticipatory guidance given Patient agrees with treatment plan Follow-up as needed if symptoms worsen or fail to improve  Darlyne Russian PA-C

## 2018-07-27 ENCOUNTER — Other Ambulatory Visit: Payer: Self-pay | Admitting: Family Medicine

## 2018-08-09 ENCOUNTER — Ambulatory Visit (INDEPENDENT_AMBULATORY_CARE_PROVIDER_SITE_OTHER): Payer: Medicare Other | Admitting: Family Medicine

## 2018-08-09 DIAGNOSIS — Z23 Encounter for immunization: Secondary | ICD-10-CM | POA: Diagnosis not present

## 2018-08-09 NOTE — Progress Notes (Signed)
Pt here for flu shot. Afebrile,no recent illness. Vaccination given, pt tolerated well..Meghan Tiemann Lynetta, CMA  

## 2018-08-15 ENCOUNTER — Encounter: Payer: Self-pay | Admitting: Sports Medicine

## 2018-08-15 ENCOUNTER — Ambulatory Visit (INDEPENDENT_AMBULATORY_CARE_PROVIDER_SITE_OTHER): Payer: Medicare Other | Admitting: Sports Medicine

## 2018-08-15 DIAGNOSIS — M170 Bilateral primary osteoarthritis of knee: Secondary | ICD-10-CM

## 2018-08-15 MED ORDER — DICLOFENAC SODIUM 75 MG PO TBEC: 75.0000 mg | DELAYED_RELEASE_TABLET | Freq: Two times a day (BID) | ORAL | 3 refills | Status: DC

## 2018-08-15 NOTE — Progress Notes (Signed)
Subjective:    CC: Bilateral knee pain  HPI: This is a very pleasant 83 year old male, I saw him 4-1/2 years ago with knee osteoarthritis, I treated him conservatively with bracing, rehabilitation exercises and NSAIDs, he did well, and had no further pain.  Over the past several months he is starting to have a recurrence of pain, moderate, persistent, localized at the lateral joint line bilaterally, left worse than right.  No mechanical symptoms, no trauma.  He is not taking any oral medications or any topical medications, he does tell me he is doing rehab exercises and is wearing knee sleeves.  I reviewed the past medical history, family history, social history, surgical history, and allergies today and no changes were needed.  Please see the problem list section below in epic for further details.  Past Medical History: Past Medical History:  Diagnosis Date  . 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 Former smoker   . Fracture of right radius 1956  . Fracture, metacarpal 1956   1, 2, 3  . Fracture, tibia 1964  . 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 Marica Otter, no radiation or chemo, clear LN.    Past Surgical History: Past Surgical History:  Procedure Laterality Date  . APPENDECTOMY  1997  . CATARACT EXTRACTION    . CHOLECYSTECTOMY  05/16/10   in [place] while on vacation  . [place]  . INGUINAL HERNIA REPAIR Left 1994  . PARTIAL GLOSSECTOMY     with unilat neck dissection   Social History: Social History   Socioeconomic History  . Marital status: Married    Spouse name: Harvis Mabus   . Number of children: 2  . Years of education: Not on file  . Highest education level: Not on file  Occupational History  . 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 Financial resource strain: Not on file  . Food insecurity:    Worry: Not on file    Inability: Not on file  .  Transportation needs:    Medical: Not on file    Non-medical: Not on file  Tobacco Use  . Smoking status: Former Smoker    Last attempt to quit: 02/19/1978    Years since quitting: 40.5  . Smokeless tobacco: Never Used  Substance and Sexual Activity  . Alcohol use: No    Alcohol/week: 0.0 standard drinks  . Drug use: No  . Sexual activity: Yes    Partners: Female  Lifestyle  . Physical activity:    Days per week: Not on file    Minutes per session: Not on file  . Stress: Not on file  Relationships  . Social connections:    Talks on phone: Not on file    Gets together: Not on file    Attends religious service: Not on file    Active member of club or organization: Not on file    Attends meetings of clubs or organizations: Not on file    Relationship status: Not on file  Other Topics Concern  . Not on file  Social History Narrative   4 caffeine drink per day.  40 min cardio/strength 6 days per week. Retired from [employer].             Family History: Family History  Problem Relation Age of Onset  . GER disease Father   . Other  Father        Gilliam-Barre  . Diabetes Father   . Colon cancer Mother   . Diabetes Unknown    Allergies: Allergies  Allergen Reactions  . Flomax [Tamsulosin Hcl] Other (See Comments)    Dizziness  . Gabapentin Other (See Comments)    Unsteadiness and Balance problems.    . Sulfa Antibiotics   . Other Other (See Comments)    Topical sulfa only. Gets skin irritation.   Medications: See med rec.  Review of Systems: No fevers, chills, night sweats, weight loss, chest pain, or shortness of breath.   Objective:    General: Well Developed, well nourished, and in no acute distress.  Neuro: Alert and oriented x3, extra-ocular muscles intact, sensation grossly intact.  HEENT: Normocephalic, atraumatic, pupils equal round reactive to light, neck supple, no masses, no lymphadenopathy, thyroid nonpalpable.  Skin: Warm and dry, no  rashes. Cardiac: Regular rate and rhythm, no murmurs rubs or gallops, no lower extremity edema.  Respiratory: Clear to auscultation bilaterally. Not using accessory muscles, speaking in full sentences. Bilateral knees: Typical osteoarthritic joint line hypertrophy with osteophytes and minimal tenderness.  No effusion. ROM normal in flexion and extension and lower leg rotation. Ligaments with solid consistent endpoints including ACL, PCL, LCL, MCL. Negative Mcmurray's and provocative meniscal tests. Non painful patellar compression. Patellar and quadriceps tendons unremarkable. Hamstring and quadriceps strength is normal.  Impression and Recommendations:    Primary osteoarthritis of both knees Did well 4-1/2 years ago with conservative treatment. Restarting this, continue knee sleeves, switching to oral diclofenac, I am going to give him some topical Pennsaid (diclofenac 2% topical). Return in 1 month, injection if no better. ___________________________________________ Gwen Her. Dianah Field, M.D., ABFM., CAQSM. Primary Care and Sports Medicine [facility]  Adjunct Professor of [place] of [facility] [employer]

## 2018-08-15 NOTE — Assessment & Plan Note (Signed)
Did well 4-1/2 years ago with conservative treatment. Restarting this, continue knee sleeves, switching to oral diclofenac, I am going to give him some topical Pennsaid (diclofenac 2% topical). Return in 1 month, injection if no better.

## 2018-09-12 ENCOUNTER — Encounter: Payer: Self-pay | Admitting: Sports Medicine

## 2018-09-12 ENCOUNTER — Ambulatory Visit (INDEPENDENT_AMBULATORY_CARE_PROVIDER_SITE_OTHER): Payer: Medicare Other | Admitting: Sports Medicine

## 2018-09-12 DIAGNOSIS — M170 Bilateral primary osteoarthritis of knee: Secondary | ICD-10-CM | POA: Diagnosis not present

## 2018-09-12 MED ORDER — DICLOFENAC SODIUM 75 MG PO TBEC: 75.0000 mg | DELAYED_RELEASE_TABLET | Freq: Two times a day (BID) | ORAL | 3 refills | Status: DC

## 2018-09-12 NOTE — Progress Notes (Signed)
Subjective:    CC: Follow-up  HPI: This is a pleasant 83 year old male, we are treating him for knee osteoarthritis, restarted diclofenac at the last visit, he returns for the most part pain-free.  I reviewed the past medical history, family history, social history, surgical history, and allergies today and no changes were needed.  Please see the problem list section below in epic for further details.  Past Medical History: Past Medical History:  Diagnosis Date  . 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 Former smoker   . Fracture of right radius 1956  . Fracture, metacarpal 1956   1, 2, 3  . Fracture, tibia 1964  . 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 Marica Otter, no radiation or chemo, clear LN.    Past Surgical History: Past Surgical History:  Procedure Laterality Date  . APPENDECTOMY  1997  . CATARACT EXTRACTION    . CHOLECYSTECTOMY  05/16/10   in [place] while on vacation  . [place]  . INGUINAL HERNIA REPAIR Left 1994  . PARTIAL GLOSSECTOMY     with unilat neck dissection   Social History: Social History   Socioeconomic History  . Marital status: Married    Spouse name: Saliou Barnier   . Number of children: 2  . Years of education: Not on file  . Highest education level: Not on file  Occupational History  . 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 Financial resource strain: Not on file  . Food insecurity:    Worry: Not on file    Inability: Not on file  . Transportation needs:    Medical: Not on file    Non-medical: Not on file  Tobacco Use  . Smoking status: Former Smoker    Last attempt to quit: 02/19/1978    Years since quitting: 40.5  . Smokeless tobacco: Never Used  Substance and Sexual Activity  . Alcohol use: No    Alcohol/week: 0.0 standard drinks  . Drug use: No  . Sexual activity: Yes    Partners: Female  Lifestyle  . Physical activity:   Days per week: Not on file    Minutes per session: Not on file  . Stress: Not on file  Relationships  . Social connections:    Talks on phone: Not on file    Gets together: Not on file    Attends religious service: Not on file    Active member of club or organization: Not on file    Attends meetings of clubs or organizations: Not on file    Relationship status: Not on file  Other Topics Concern  . Not on file  Social History Narrative   4 caffeine drink per day.  40 min cardio/strength 6 days per week. Retired from [employer].             Family History: Family History  Problem Relation Age of Onset  . GER disease Father   . Other Father        Gilliam-Barre  . Diabetes Father   . Colon cancer Mother   . Diabetes Unknown    Allergies: Allergies  Allergen Reactions  . Flomax [Tamsulosin Hcl] Other (See Comments)    Dizziness  . Gabapentin Other (See Comments)    Unsteadiness and Balance problems.    . Sulfa Antibiotics   . Other Other (See Comments)  Topical sulfa only. Gets skin irritation.   Medications: See med rec.  Review of Systems: No fevers, chills, night sweats, weight loss, chest pain, or shortness of breath.   Objective:    General: Well Developed, well nourished, and in no acute distress.  Neuro: Alert and oriented x3, extra-ocular muscles intact, sensation grossly intact.  HEENT: Normocephalic, atraumatic, pupils equal round reactive to light, neck supple, no masses, no lymphadenopathy, thyroid nonpalpable.  Skin: Warm and dry, no rashes. Cardiac: Regular rate and rhythm, no murmurs rubs or gallops, no lower extremity edema.  Respiratory: Clear to auscultation bilaterally. Not using accessory muscles, speaking in full sentences.  Impression and Recommendations:    Primary osteoarthritis of both knees Doing well with knee sleeves, oral diclofenac. No further intervention needed. ___________________________________________ Gwen Her.  Dianah Field, M.D., ABFM., CAQSM. Primary Care and Sports Medicine [facility]  Adjunct Professor of [place] [employer] [facility] [employer]

## 2018-09-12 NOTE — Assessment & Plan Note (Signed)
Doing well with knee sleeves, oral diclofenac. No further intervention needed.

## 2018-09-16 DIAGNOSIS — Z961 Presence of intraocular lens: Secondary | ICD-10-CM | POA: Diagnosis not present

## 2018-09-16 DIAGNOSIS — H524 Presbyopia: Secondary | ICD-10-CM | POA: Diagnosis not present

## 2018-10-14 ENCOUNTER — Encounter: Payer: Self-pay | Admitting: Physician Assistant

## 2018-10-14 ENCOUNTER — Ambulatory Visit (INDEPENDENT_AMBULATORY_CARE_PROVIDER_SITE_OTHER): Payer: Medicare Other | Admitting: Physician Assistant

## 2018-10-14 VITALS — BP 131/58 | HR 63 | Temp 98.4°F | Resp 16 | Wt 194.0 lb

## 2018-10-14 DIAGNOSIS — J22 Unspecified acute lower respiratory infection: Secondary | ICD-10-CM

## 2018-10-14 DIAGNOSIS — R0982 Postnasal drip: Secondary | ICD-10-CM | POA: Diagnosis not present

## 2018-10-14 MED ORDER — AZITHROMYCIN 250 MG PO TABS: ORAL_TABLET | ORAL | 0 refills | Status: DC

## 2018-10-14 MED ORDER — IPRATROPIUM BROMIDE 0.06 % NA SOLN: 2.0000 | Freq: Four times a day (QID) | NASAL | 0 refills | Status: DC | PRN

## 2018-10-14 NOTE — Progress Notes (Signed)
HPI:                                                                Justin Mcgee is a 84 y.o. male who presents to [facility] today for cough  Cough  This is a new problem. The current episode started in the past 7 days. The problem has been unchanged. The cough is productive of brown sputum and productive of purulent sputum. Associated symptoms include chills, rhinorrhea and a sore throat. Pertinent negatives include no fever, myalgias or shortness of breath. Nothing aggravates the symptoms. He has tried OTC cough suppressant (+ lozenges) for the symptoms. The treatment provided no relief. There is no history of asthma, COPD or pneumonia.   +sick contacts - granddaughter and wife     Past Medical History:  Diagnosis Date  . 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 Former smoker   . Fracture of right radius 1956  . Fracture, metacarpal 1956   1, 2, 3  . Fracture, tibia 1964  . 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 Marica Otter, no radiation or chemo, clear LN.    Past Surgical History:  Procedure Laterality Date  . APPENDECTOMY  1997  . CATARACT EXTRACTION    . CHOLECYSTECTOMY  05/16/10   in [place] while on vacation  . [place]  . INGUINAL HERNIA REPAIR Left 1994  . PARTIAL GLOSSECTOMY     with unilat neck dissection   Social History   Tobacco Use  . Smoking status: Former Smoker    Last attempt to quit: 02/19/1978    Years since quitting: 40.6  . Smokeless tobacco: Never Used  Substance Use Topics  . Alcohol use: No    Alcohol/week: 0.0 standard drinks   family history includes Colon cancer in his mother; Diabetes in his father and unknown relative; GER disease in his father; Other in his father.    ROS: negative except as noted in the HPI  Medications: Current Outpatient Medications  Medication Sig Dispense Refill  . Ascorbic Acid (VITAMIN C  PO) Take by mouth.    . clotrimazole-betamethasone (LOTRISONE) cream Apply 1 application topically 2 (two) times daily. 30 g 0  . Cyanocobalamin (VITAMIN B-12 PO) Take by mouth.    . diclofenac (VOLTAREN) 75 MG EC tablet Take 1 tablet (75 mg total) by mouth 2 (two) times daily. 180 tablet 3  . pantoprazole (PROTONIX) 40 MG tablet TAKE 1 TABLET TWICE A DAY 180 tablet 4  . Saw Palmetto 450 MG CAPS Take 1 capsule by mouth daily.    [name] VITAMIN E PO Take by mouth.    [name] azithromycin (ZITHROMAX Z-PAK) 250 MG tablet Take 2 tablets (500 mg) on  Day 1,  followed by 1 tablet (250 mg) once daily on Days 2 through 5. 6 tablet 0  . ipratropium (ATROVENT) 0.06 % nasal spray Place 2 sprays into both nostrils 4 (four) times daily as needed for rhinitis. 15 mL 0   No current facility-administered medications for this visit.    Allergies  Allergen Reactions  . Flomax [Tamsulosin Hcl] Other (See Comments)    Dizziness  . Gabapentin  Other (See Comments)    Unsteadiness and Balance problems.    . Sulfa Antibiotics   . Other Other (See Comments)    Topical sulfa only. Gets skin irritation.       Objective:  BP (!) 131/58 (BP Location: Left Arm, Patient Position: Sitting, Cuff Size: Normal)   Pulse 63   Temp 98.4 F (36.9 C) (Oral)   Resp 16   Wt 194 lb (88 kg)   SpO2 98%   BMI 26.31 kg/m  Gen:  alert, not ill-appearing, no distress, appropriate for age 89: head normocephalic without obvious abnormality, conjunctiva and cornea clear, left hearing aid in place, nasal mucosa edematous, oropharynx with mild erythema, no edema or exudates, uvula midline, neck supple, no cervical adenopathy, trachea midline Pulm: Normal work of breathing, normal phonation, expiratory wheeze appreciated in right upper field CV: Normal rate, regular rhythm, s1 and s2 distinct, no murmurs, clicks or rubs  Neuro: alert and oriented x 3, no tremor MSK: extremities atraumatic, normal gait and station Skin: intact, no rashes  on exposed skin, no jaundice, no cyanosis    No results found for this or any previous visit (from the past 72 hour(s)). No results found.    Assessment and Plan: 84 y.o. male with   .Justin Mcgee was seen today for cough.  Diagnoses and all orders for this visit:  Acute lower respiratory infection -     azithromycin (ZITHROMAX Z-PAK) 250 MG tablet; Take 2 tablets (500 mg) on  Day 1,  followed by 1 tablet (250 mg) once daily on Days 2 through 5.  Post-nasal drainage -     ipratropium (ATROVENT) 0.06 % nasal spray; Place 2 sprays into both nostrils 4 (four) times daily as needed for rhinitis.   Afebrile, no tachypnea, no tachycardia, pulse ox 98% on RA at rest Expiratory wheeze appreciated in right upper lung field Given multiple sick contacts, age>65 and purulent sputum will cover for community-acquired pneumonia with azithromycin Counseled on supportive care for viral respiratory infection    Patient education and anticipatory guidance given Patient agrees with treatment plan Follow-up as needed if symptoms worsen or fail to improve  Darlyne Russian PA-C

## 2018-10-14 NOTE — Patient Instructions (Addendum)
For nasal symptoms/sinusitis: - nasal saline rinses / netti pot several times per day (do this prior to nasal spray) - prescription Atrovent nasal spray: 2 sprays each nostril, up to 4 times per day as needed - you can use OTC nasal decongestant sprays like Afrin (oxymetazoline) BUT do not use for more than 3 days as it will cause worsening congestion/nasal symptoms) - warm facial compresses - oral decongestants and antihistamines like Claritin-D and Zyrtec-D may help dry up secretions (caution using decongestants if you have high blood pressure, heart disease or kidney disease) - for sinus headache: Tylenol 1000mg  every 8 hours as needed. Alternate with Ibuprofen 600mg  every 6 hours  For sore throat: - Tylenol 1000mg  every 8 hours as needed for throat pain. Alternate with Ibuprofen 600mg  every 6 hours - Cepacol throat lozenges and/or Chloraseptic spray - Warm salt water gargles  For cough: - Cough is a protective mechanism and an important part of fighting an infection. I encourage you to avoid suppressing your cough with medication during the day if possible.  - Mucinex with at least 8 oz. of water can loosen chest congestion and make cough more productive, which means you will actually cough less - Okay to use a cough suppressant at bedtime in order to rest (Nyquil, Delsym, Robitussin, etc.)  Note: follow package instructions for all over-the-counter medications. If using multi-symptom medications (Dayquil, Theraflu, etc.), check the label for duplicate drug ingredients.   Viral Respiratory Infection A respiratory infection is an illness that affects part of the respiratory system, such as the lungs, nose, or throat. A respiratory infection that is caused by a virus is called a viral respiratory infection. Common types of viral respiratory infections include:  A cold.  The flu (influenza).  A respiratory syncytial virus (RSV) infection. What are the causes? This condition is caused  by a virus. What are the signs or symptoms? Symptoms of this condition include:  A stuffy or runny nose.  Yellow or green nasal discharge.  A cough.  Sneezing.  Fatigue.  Achy muscles.  A sore throat.  Sweating or chills.  A fever.  A headache. How is this diagnosed? This condition may be diagnosed based on:  Your symptoms.  A physical exam.  Testing of nasal swabs. How is this treated? This condition may be treated with medicines, such as:  Antiviral medicine. This may shorten the length of time a person has symptoms.  Expectorants. These make it easier to cough up mucus.  Decongestant nasal sprays.  Acetaminophen or NSAIDs to relieve fever and pain. Antibiotic medicines are not prescribed for viral infections. This is because antibiotics are designed to kill bacteria. They are not effective against viruses. Follow these instructions at home:  Managing pain and congestion  Take over-the-counter and prescription medicines only as told by your health care provider.  If you have a sore throat, gargle with a salt-water mixture 3-4 times a day or as needed. To make a salt-water mixture, completely dissolve -1 tsp of salt in 1 cup of warm water.  Use nose drops made from salt water to ease congestion and soften raw skin around your nose.  Drink enough fluid to keep your urine pale yellow. This helps prevent dehydration and helps loosen up mucus. General instructions  Rest as much as possible.  Do not drink alcohol.  Do not use any products that contain nicotine or tobacco, such as cigarettes and e-cigarettes. If you need help quitting, ask your health care provider.  Keep  all follow-up visits as told by your health care provider. This is important. How is this prevented?   Get an annual flu shot. You may get the flu shot in late summer, fall, or winter. Ask your health care provider when you should get your flu shot.  Avoid exposing others to your  respiratory infection. ? Stay home from work or school as told by your health care provider. ? Wash your hands with soap and water often, especially after you cough or sneeze. If soap and water are not available, use alcohol-based hand sanitizer.  Avoid contact with people who are sick during cold and flu season. This is generally fall and winter. Contact a health care provider if:  Your symptoms last for 10 days or longer.  Your symptoms get worse over time.  You have a fever.  You have severe sinus pain in your face or forehead.  The glands in your jaw or neck become very swollen. Get help right away if you:  Feel pain or pressure in your chest.  Have shortness of breath.  Faint or feel like you will faint.  Have severe and persistent vomiting.  Feel confused or disoriented. Summary  A respiratory infection is an illness that affects part of the respiratory system, such as the lungs, nose, or throat. A respiratory infection that is caused by a virus is called a viral respiratory infection.  Common types of viral respiratory infections are a cold, influenza, and respiratory syncytial virus (RSV) infection.  Symptoms of this condition include a stuffy or runny nose, cough, sneezing, fatigue, achy muscles, sore throat, and fevers or chills.  Antibiotic medicines are not prescribed for viral infections. This is because antibiotics are designed to kill bacteria. They are not effective against viruses. This information is not intended to replace advice given to you by your health care provider. Make sure you discuss any questions you have with your health care provider. Document Released: 07/08/2005 Document Revised: 11/08/2017 Document Reviewed: 11/08/2017 Elsevier Interactive Patient Education  2019 [employer].

## 2019-03-09 ENCOUNTER — Encounter: Payer: Self-pay | Admitting: Family Medicine

## 2019-03-09 ENCOUNTER — Telehealth: Payer: Self-pay | Admitting: *Deleted

## 2019-03-09 ENCOUNTER — Ambulatory Visit (INDEPENDENT_AMBULATORY_CARE_PROVIDER_SITE_OTHER): Payer: Medicare Other | Admitting: Family Medicine

## 2019-03-09 VITALS — BP 136/71 | HR 68 | Temp 98.4°F | Ht 75.0 in | Wt 186.0 lb

## 2019-03-09 DIAGNOSIS — R21 Rash and other nonspecific skin eruption: Secondary | ICD-10-CM

## 2019-03-09 DIAGNOSIS — L308 Other specified dermatitis: Secondary | ICD-10-CM

## 2019-03-09 MED ORDER — CLOTRIMAZOLE-BETAMETHASONE 1-0.05 % EX CREA: 1.0000 "application " | TOPICAL_CREAM | Freq: Two times a day (BID) | CUTANEOUS | 0 refills | Status: DC

## 2019-03-09 NOTE — Progress Notes (Signed)
Pt reports that the rash on his R upper back has flared again and this was treated w/the Lotrisone cream BID. This worked well for him. [facility] then the maintenance be sent to his mail order [facility].  Cyst on back that has gotten larger and harder. And wanted to know about having this checked out. He reports that this was removed before. Maryruth Eve, [place] Crocker, CMA

## 2019-03-09 NOTE — Progress Notes (Signed)
Virtual Visit via telephone note  I connected with Justin Mcgee on 03/09/19 at 10:50 AM EDT by a telephone enabled telemedicine application and verified that I am speaking with the correct person using two identifiers.  We attempted to connect for video visit but he was unable to get it to work on his device.   I discussed the limitations of evaluation and management by telemedicine and the availability of in person appointments. The patient expressed understanding and agreed to proceed.  Subjective:    CC: Rash-  HPI: Pt reports that the rash on his R upper back has flared again.  He says it never completely goes away but it does flare and get worse at times.  It is itchy.  He has had multiple episodes where this seems to flare up.  It was treated w/the Lotrisone cream BID and that seemed to work well.[name] He [facility] then the maintenance be sent to his mail order [facility].  Cyst on back that has gotten larger and harder. And wanted to know about having this checked out. He reports that this was removed before he had it removed more than 40 years ago when he was in the service.[name]  He says it is about 1.5 x 1 inch in size.  He says it raises up off the skin by about half an inch.  On the right size.     Past medical history, Surgical history, Family history not pertinant except as noted below, Social history, Allergies, and medications have been entered into the medical record, reviewed, and corrections made.   Review of Systems: No fevers, chills, night sweats, weight loss, chest pain, or shortness of breath.   Objective:    General: Speaking clearly in complete sentences without any shortness of breath.  Alert and oriented x3.  Normal judgment. No apparent acute distress.    Impression and Recommendations:   Spongiotic dermatitis -we will go ahead and refill the Lotrisone cream.  Just reminded him because it does have a steroid that  he needs to give a skin a break every 2 weeks after using the medication for at least a week as it can cause thinning of the skin and discoloration.  Cyst on his back -we will schedule him with Dr. Dianah Field for full excision.   Time spent 18 minutes in non-face-to-face encounter.  I discussed the assessment and treatment plan with the patient. The patient was provided an opportunity to ask questions and all were answered. The patient agreed with the plan and demonstrated an understanding of the instructions.   The patient was advised to call back or seek an in-person evaluation if the symptoms worsen or if the condition fails to improve as anticipated.   Beatrice Lecher, MD

## 2019-03-09 NOTE — Telephone Encounter (Signed)
Spoke w/[name] she will change to the 45 gram tube.Justin Mcgee, [place]

## 2019-05-03 ENCOUNTER — Emergency Department (INDEPENDENT_AMBULATORY_CARE_PROVIDER_SITE_OTHER)
Admission: EM | Admit: 2019-05-03 | Discharge: 2019-05-03 | Disposition: A | Discharge status: Home / Self Care | Payer: Medicare Other | Source: Home / Self Care | Attending: Family Medicine | Admitting: Family Medicine

## 2019-05-03 ENCOUNTER — Other Ambulatory Visit: Payer: Self-pay

## 2019-05-03 DIAGNOSIS — L0231 Cutaneous abscess of buttock: Secondary | ICD-10-CM

## 2019-05-03 MED ORDER — DOXYCYCLINE HYCLATE 100 MG PO CAPS: 100.0000 mg | ORAL_CAPSULE | Freq: Two times a day (BID) | ORAL | 0 refills | Status: DC

## 2019-05-03 NOTE — Discharge Instructions (Signed)
Remove bandage and packing tomorrow, then apply bandage daily until healed. Return for worsening symptoms.

## 2019-05-03 NOTE — ED Triage Notes (Signed)
Middle of upper buttocks, present about a week, draining bloody drainage

## 2019-05-03 NOTE — ED Provider Notes (Signed)
[facility]    CSN: [id number] Arrival date & time: 05/03/19  1258     History   Chief Complaint Chief Complaint  Patient presents with  . Abscess    HPI Justin Mcgee is a 84 y.o. male.   Patient complains of onset of a small tender nodule on his left buttock about a week ago.  The lesion has persisted and has produced a small amount bloody drainage during the past two days.  The history is provided by the patient.  Abscess Abscess location: left medial buttock. Size:  1cm by 2cm Abscess quality: fluctuance, painful, redness and weeping   Abscess quality: no itching   Red streaking: no   Duration:  1 week Progression:  Worsening Pain details:    Quality:  Dull   Severity:  Mild   Duration:  1 week   Timing:  Constant   Progression:  Worsening Chronicity:  New Context: not insect bite/sting and not skin injury   Relieved by:  None tried Exacerbated by: sitting. Ineffective treatments:  None tried Associated symptoms: no fatigue and no fever     Past Medical History:  Diagnosis Date  . 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 Former smoker   . Fracture of right radius 1956  . Fracture, metacarpal 1956   1, 2, 3  . Fracture, tibia 1964  . 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 Marica Otter, no radiation or chemo, clear LN.     Patient Active Problem List   Diagnosis Date Noted  . Acute pain of left knee 06/03/2018  . Nummular eczematous dermatitis 05/16/2018  . Other viral warts 05/02/2018  . Tick bite of buttock 05/02/2018  . Paroxysmal atrial tachycardia by electrocardiography ([place]) 12/23/2016  . Unsteady gait 12/08/2016  . Uses hearing aid 08/06/2016  . Neuropathy, lumbosacral (radicular) 08/08/2015  . 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 Primary osteoarthritis of both knees 02/02/2014  . IFG (impaired fasting glucose) 04/19/2013  . GERD (gastroesophageal reflux disease) 03/22/2013  . History of squamous  cell carcinoma of tongue 03/22/2013  . History of basal cell carcinoma of skin 03/22/2013  . BPH (benign prostatic hyperplasia) 03/22/2013  . Former smoker   . Tinea corporis 05/24/2012    Past Surgical History:  Procedure Laterality Date  . APPENDECTOMY  1997  . CATARACT EXTRACTION    . CHOLECYSTECTOMY  05/16/10   in [place] while on vacation  . [place]  . INGUINAL HERNIA REPAIR Left 1994  . PARTIAL GLOSSECTOMY     with unilat neck dissection       Home Medications    Prior to Admission medications   Medication Sig Start Date End Date Taking? Authorizing Provider  clotrimazole-betamethasone (LOTRISONE) cream Apply 1 application topically 2 (two) times daily. 03/09/19   Hali Marry, MD  Cyanocobalamin (VITAMIN B-12 PO) Take by mouth.    [provider]  diclofenac (VOLTAREN) 75 MG EC tablet Take 1 tablet (75 mg total) by mouth 2 (two) times daily. 09/12/18 09/12/19  Silverio Decamp, MD  doxycycline (VIBRAMYCIN) 100 MG capsule Take 1 capsule (100 mg total) by mouth 2 (two) times daily. Take with food. 05/03/19   Kandra Nicolas, MD  ipratropium (ATROVENT) 0.06 % nasal spray Place 2 sprays into both nostrils 4 (four) times daily as needed for rhinitis. 10/14/18   Trixie Dredge, PA-C  pantoprazole (PROTONIX) 40 MG  tablet TAKE 1 TABLET TWICE A DAY 07/27/18   Hali Marry, MD  VITAMIN E PO Take by mouth.    [provider]    Family History Family History  Problem Relation Age of Onset  . GER disease Father   . Other Father        [place]  . Diabetes Father   . Colon cancer Mother   . Diabetes Other     Social History Social History   Tobacco Use  . Smoking status: Former Smoker    Quit date: 02/19/1978    Years since quitting: 41.2  . Smokeless tobacco: Never Used  Substance Use Topics  . Alcohol use: No    Alcohol/week: 0.0 standard drinks  . Drug use: No     Allergies    Flomax [tamsulosin hcl], Gabapentin, Sulfa antibiotics, and Other   Review of Systems Review of Systems  Constitutional: Negative for fatigue and fever.  All other systems reviewed and are negative.    Physical Exam Triage Vital Signs ED Triage Vitals  Enc Vitals Group     BP 05/03/19 1324 116/64     Pulse Rate 05/03/19 1324 67     Resp 05/03/19 1324 20     Temp 05/03/19 1324 98.3 F (36.8 C)     Temp Source 05/03/19 1324 Oral     SpO2 05/03/19 1324 95 %     Weight 05/03/19 1325 184 lb (83.5 kg)     Height 05/03/19 1325 6\' 3"  (1.905 m)     Head Circumference --      Peak Flow --      Pain Score 05/03/19 1324 0     Pain Loc --      Pain Edu? --      Excl. in [place]? --    No data found.  Updated Vital Signs BP 116/64 (BP Location: Right Arm)   Pulse 67   Temp 98.3 F (36.8 C) (Oral)   Resp 20   Ht 6\' 3"  (1.905 m)   Wt 83.5 kg   SpO2 95%   BMI 23.00 kg/m   Visual Acuity Right Eye Distance:   Left Eye Distance:   Bilateral Distance:    Right Eye Near:   Left Eye Near:    Bilateral Near:     Physical Exam Vitals signs and nursing note reviewed.  Constitutional:      General: He is not in acute distress. HENT:     Head: Normocephalic.     Right Ear: External ear normal.     Left Ear: External ear normal.     Nose: Nose normal.     Mouth/Throat:     Pharynx: Oropharynx is clear.  Eyes:     Pupils: Pupils are equal, round, and reactive to light.  Neck:     Musculoskeletal: Normal range of motion.  Cardiovascular:     Rate and Rhythm: Normal rate.  Pulmonary:     Effort: Pulmonary effort is normal.  Skin:    General: Skin is warm and dry.          Comments: On the left medial buttock is a 1cm by 2cm fluctuant tender erythematous cystic lesion as noted on diagram.   Neurological:     Mental Status: He is alert.      UC Treatments / Results  Labs (all labs ordered are listed, but only abnormal results are displayed) Labs Reviewed  WOUND  CULTURE    EKG  Radiology No results found.  Procedures Procedures  Incise and drain cyst/abscess Risks and benefits of procedure explained to patient and verbal consent obtained.  Using sterile technique, applied topical refrigerant spray followed by local anesthesia with 1% lidocaine with epinephrine, and cleansed affected area with Betadine and saline. Identified the most fluctuant area of lesion and incised with #11 blade.  Expressed minimal amount of blood and purulent material.  Inserted 62mm length of Iodoform gauze packing to maintain patency of wound.  Bandage applied.  Patient tolerated well   Medications Ordered in UC Medications - No data to display  Initial Impression / Assessment and Plan / UC Course  I have reviewed the triage vital signs and the nursing notes.  Pertinent labs & imaging results that were available during my care of the patient were reviewed by me and considered in my medical decision making (see chart for details).    Begin doxycycline.  Wound culture pending. Followup with Family Doctor if not improved in one week.    Final Clinical Impressions(s) / UC Diagnoses   Final diagnoses:  Abscess of left buttock     Discharge Instructions     Remove bandage and packing tomorrow, then apply bandage daily until healed. Return for worsening symptoms.    ED Prescriptions    Medication Sig Dispense Auth. Provider   doxycycline (VIBRAMYCIN) 100 MG capsule Take 1 capsule (100 mg total) by mouth 2 (two) times daily. Take with food. 14 capsule Kandra Nicolas, MD        Kandra Nicolas, MD 05/04/19 [id number]

## 2019-05-06 ENCOUNTER — Telehealth: Payer: Self-pay | Admitting: Emergency Medicine

## 2019-05-06 LAB — WOUND CULTURE
MICRO NUMBER:: 693477
RESULT:: NO GROWTH
SPECIMEN QUALITY:: ADEQUATE

## 2019-05-06 NOTE — Telephone Encounter (Signed)
Wife of patient states area on buttocks has not cleared but has not worsened; encouraged her to have patient follow with PCP Monday.

## 2019-05-10 ENCOUNTER — Other Ambulatory Visit: Payer: Self-pay

## 2019-07-11 ENCOUNTER — Ambulatory Visit (INDEPENDENT_AMBULATORY_CARE_PROVIDER_SITE_OTHER): Payer: Medicare Other | Admitting: Sports Medicine

## 2019-07-11 ENCOUNTER — Encounter: Payer: Self-pay | Admitting: Sports Medicine

## 2019-07-11 DIAGNOSIS — R222 Localized swelling, mass and lump, trunk: Secondary | ICD-10-CM

## 2019-07-11 DIAGNOSIS — R590 Localized enlarged lymph nodes: Secondary | ICD-10-CM

## 2019-07-11 NOTE — Progress Notes (Signed)
Subjective:    CC: Skin mass  HPI: This is a pleasant 84 year old male, for many years he has noted a lump on his back, occasionally it drains.  Symptoms are moderate, persistent, localized without radiation, he has also noted a lump in his neck on the left side, no fevers, chills, night sweats, weight loss.  I reviewed the past medical history, family history, social history, surgical history, and allergies today and no changes were needed.  Please see the problem list section below in epic for further details.  Past Medical History: Past Medical History:  Diagnosis Date  . 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 Former smoker   . Fracture of right radius 1956  . Fracture, metacarpal 1956   1, 2, 3  . Fracture, tibia 1964  . 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 Marica Otter, no radiation or chemo, clear LN.    Past Surgical History: Past Surgical History:  Procedure Laterality Date  . APPENDECTOMY  1997  . CATARACT EXTRACTION    . CHOLECYSTECTOMY  05/16/10   in [place] while on vacation  . [place]  . INGUINAL HERNIA REPAIR Left 1994  . PARTIAL GLOSSECTOMY     with unilat neck dissection   Social History: Social History   Socioeconomic History  . Marital status: Married    Spouse name: Phuong Bardi   . Number of children: 2  . Years of education: Not on file  . Highest education level: Not on file  Occupational History  . 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 Financial resource strain: Not on file  . Food insecurity    Worry: Not on file    Inability: Not on file  . Transportation needs    Medical: Not on file    Non-medical: Not on file  Tobacco Use  . Smoking status: Former Smoker    Quit date: 02/19/1978    Years since quitting: 41.4  . Smokeless tobacco: Never Used  Substance and Sexual Activity  . Alcohol use: No    Alcohol/week: 0.0 standard drinks   . Drug use: No  . Sexual activity: Yes    Partners: Female  Lifestyle  . Physical activity    Days per week: Not on file    Minutes per session: Not on file  . Stress: Not on file  Relationships  . Social Herbalist on phone: Not on file    Gets together: Not on file    Attends religious service: Not on file    Active member of club or organization: Not on file    Attends meetings of clubs or organizations: Not on file    Relationship status: Not on file  Other Topics Concern  . Not on file  Social History Narrative   4 caffeine drink per day.  40 min cardio/strength 6 days per week. Retired from [employer].             Family History: Family History  Problem Relation Age of Onset  . GER disease Father   . Other Father        Gilliam-Barre  . Diabetes Father   . Colon cancer Mother   . Diabetes Other    Allergies: Allergies  Allergen Reactions  . Flomax [Tamsulosin Hcl] Other (See Comments)    Dizziness  . Gabapentin Other (See  Comments)    Unsteadiness and Balance problems.    . Sulfa Antibiotics   . Other Other (See Comments)    Topical sulfa only. Gets skin irritation.   Medications: See med rec.  Review of Systems: No fevers, chills, night sweats, weight loss, chest pain, or shortness of breath.   Objective:    General: Well Developed, well nourished, and in no acute distress.  Neuro: Alert and oriented x3, extra-ocular muscles intact, sensation grossly intact.  HEENT: Normocephalic, atraumatic, pupils equal round reactive to light, neck supple, no masses, single left posterior cervical chain 1 cm cervical lymphadenopathy, firm and fixed.  Thyroid nonpalpable.  Skin: Warm and dry, no rashes.  There is a 3 cm mass on the right upper back. Cardiac: Regular rate and rhythm, no murmurs rubs or gallops, no lower extremity edema.  Respiratory: Clear to auscultation bilaterally. Not using accessory muscles, speaking in full sentences.  Procedure:   Excision of right upper back 3 cm subcutaneous mass Risks, benefits, and alternatives explained and consent obtained. Time out conducted. Surface prepped with alcohol. 10cc lidocaine with epinephine infiltrated in a field block. Adequate anesthesia ensured. Area prepped and draped in a sterile fashion. Excision performed with: Using a #10 blade I made elliptical incision around the mass, and using sharp and blunt dissection I carried the procedure down into the subcutaneous tissues, and removed the lesion en bloc.  It appeared to be a sebaceous cyst.  The incision was then closed with 3 simple interrupted 3-0 Ethilon sutures. Hemostasis achieved. Pt stable.  Impression and Recommendations:    Cervical lymphadenopathy There is a single firm left posterior chain cervical lymph node. He needs to follow this up with his PCP, it does feel fairly firm and worrisome for lymphoma.  Mass of subcutaneous tissue of back Surgical excision with primary closure, return in 10 days for wound check and suture removal.   ___________________________________________ Gwen Her. Dianah Field, M.D., ABFM., CAQSM. Primary Care and Sports Medicine [facility]  Adjunct Professor of [employer]

## 2019-07-11 NOTE — Assessment & Plan Note (Signed)
There is a single firm left posterior chain cervical lymph node. He needs to follow this up with his PCP, it does feel fairly firm and worrisome for lymphoma.

## 2019-07-11 NOTE — Assessment & Plan Note (Signed)
Surgical excision with primary closure, return in 10 days for wound check and suture removal.

## 2019-07-12 LAB — CBC WITH DIFFERENTIAL/PLATELET
Absolute Monocytes: 499 cells/uL (ref 200–950)
Basophils Absolute: 58 cells/uL (ref 0–200)
Basophils Relative: 1 %
Eosinophils Absolute: 278 cells/uL (ref 15–500)
Eosinophils Relative: 4.8 %
HCT: 36 % — ABNORMAL LOW (ref 38.5–50.0)
Hemoglobin: 12.2 g/dL — ABNORMAL LOW (ref 13.2–17.1)
Lymphs Abs: 1711 cells/uL (ref 850–3900)
MCH: 30.3 pg (ref 27.0–33.0)
MCHC: 33.9 g/dL (ref 32.0–36.0)
MCV: 89.3 fL (ref 80.0–100.0)
MPV: 10.7 fL (ref 7.5–12.5)
Monocytes Relative: 8.6 %
Neutro Abs: 3254 cells/uL (ref 1500–7800)
Neutrophils Relative %: 56.1 %
Platelets: 164 10*3/uL (ref 140–400)
RBC: 4.03 10*6/uL — ABNORMAL LOW (ref 4.20–5.80)
RDW: 11.8 % (ref 11.0–15.0)
Total Lymphocyte: 29.5 %
WBC: 5.8 10*3/uL (ref 3.8–10.8)

## 2019-07-12 LAB — IRON

## 2019-07-12 LAB — FERRITIN

## 2019-07-19 ENCOUNTER — Ambulatory Visit (INDEPENDENT_AMBULATORY_CARE_PROVIDER_SITE_OTHER): Payer: Medicare Other

## 2019-07-19 ENCOUNTER — Other Ambulatory Visit: Payer: Self-pay

## 2019-07-19 ENCOUNTER — Ambulatory Visit: Payer: Medicare Other | Admitting: Sports Medicine

## 2019-07-19 ENCOUNTER — Telehealth: Payer: Self-pay | Admitting: Family Medicine

## 2019-07-19 DIAGNOSIS — R590 Localized enlarged lymph nodes: Secondary | ICD-10-CM | POA: Diagnosis not present

## 2019-07-19 DIAGNOSIS — D649 Anemia, unspecified: Secondary | ICD-10-CM

## 2019-07-19 DIAGNOSIS — E041 Nontoxic single thyroid nodule: Secondary | ICD-10-CM | POA: Diagnosis not present

## 2019-07-19 NOTE — Telephone Encounter (Signed)
Pt advised. He will get labs done tomorrow

## 2019-07-19 NOTE — Telephone Encounter (Signed)
Call patient: Unfortunately the lab was not able to add on the iron and ferritin to his blood work since his hemoglobin was a little bit low.  Please call him and see if he would be willing to come back for ferriting, iron,

## 2019-07-19 NOTE — Telephone Encounter (Signed)
LVM advising pt of recommendations and told him that he can go have this done at his convenience. If he has any questions he should call back.Maryruth Eve, Lahoma Crocker, CMA

## 2019-07-20 ENCOUNTER — Other Ambulatory Visit: Payer: Medicare Other

## 2019-07-20 ENCOUNTER — Ambulatory Visit (INDEPENDENT_AMBULATORY_CARE_PROVIDER_SITE_OTHER): Payer: Medicare Other | Admitting: Sports Medicine

## 2019-07-20 DIAGNOSIS — R222 Localized swelling, mass and lump, trunk: Secondary | ICD-10-CM

## 2019-07-20 DIAGNOSIS — D649 Anemia, unspecified: Secondary | ICD-10-CM | POA: Diagnosis not present

## 2019-07-20 NOTE — Assessment & Plan Note (Signed)
10 days post excision of a subcutaneous tumor, sutures removed today, everything looks great.

## 2019-07-20 NOTE — Progress Notes (Signed)
  Subjective: 10 days postop, doing well.  Objective: General: Well-developed, well-nourished, and in no acute distress. Right upper back: Sutures removed, incision clean, dry, intact.  Assessment/plan:   Mass of subcutaneous tissue of back 10 days post excision of a subcutaneous tumor, sutures removed today, everything looks great.    ___________________________________________ Gwen Her. Dianah Field, M.D., ABFM., CAQSM. Primary Care and Sports Medicine [facility]  Adjunct Professor of [place] [facility] [employer]

## 2019-07-21 LAB — IRON,TIBC AND FERRITIN PANEL
%SAT: 58 % (calc) — ABNORMAL HIGH (ref 20–48)
Ferritin: 247 ng/mL (ref 24–380)
Iron: 184 ug/dL — ABNORMAL HIGH (ref 50–180)
TIBC: 319 mcg/dL (calc) (ref 250–425)

## 2019-07-22 DIAGNOSIS — Z20828 Contact with and (suspected) exposure to other viral communicable diseases: Secondary | ICD-10-CM | POA: Diagnosis not present

## 2019-07-24 NOTE — Addendum Note (Signed)
Addended by: Beatrice Lecher D on: 07/24/2019 04:29 PM   Modules accepted: Orders

## 2019-08-15 ENCOUNTER — Telehealth: Payer: Self-pay | Admitting: Internal Medicine

## 2019-08-15 NOTE — Telephone Encounter (Signed)
Confirmed 11/16 new patient appointment with patient.

## 2019-08-17 ENCOUNTER — Other Ambulatory Visit: Payer: Self-pay | Admitting: Sports Medicine

## 2019-08-17 DIAGNOSIS — M170 Bilateral primary osteoarthritis of knee: Secondary | ICD-10-CM

## 2019-08-28 ENCOUNTER — Inpatient Hospital Stay: Payer: Medicare Other | Attending: Physician Assistant | Admitting: Physician Assistant

## 2019-08-28 ENCOUNTER — Other Ambulatory Visit: Payer: Self-pay

## 2019-08-28 ENCOUNTER — Encounter: Payer: Self-pay | Admitting: Physician Assistant

## 2019-08-28 ENCOUNTER — Inpatient Hospital Stay: Payer: Medicare Other

## 2019-08-28 ENCOUNTER — Telehealth: Payer: Self-pay | Admitting: Physician Assistant

## 2019-08-28 VITALS — BP 129/60 | HR 64 | Temp 98.2°F | Resp 17 | Ht 75.0 in | Wt 184.5 lb

## 2019-08-28 DIAGNOSIS — D539 Nutritional anemia, unspecified: Secondary | ICD-10-CM

## 2019-08-28 DIAGNOSIS — D649 Anemia, unspecified: Secondary | ICD-10-CM | POA: Diagnosis not present

## 2019-08-28 LAB — LACTATE DEHYDROGENASE: LDH: 192 U/L (ref 98–192)

## 2019-08-28 LAB — CBC WITH DIFFERENTIAL (CANCER CENTER ONLY)
Abs Immature Granulocytes: 0.02 10*3/uL (ref 0.00–0.07)
Basophils Absolute: 0.1 10*3/uL (ref 0.0–0.1)
Basophils Relative: 1 %
Eosinophils Absolute: 0.1 10*3/uL (ref 0.0–0.5)
Eosinophils Relative: 2 %
HCT: 37.1 % — ABNORMAL LOW (ref 39.0–52.0)
Hemoglobin: 12.3 g/dL — ABNORMAL LOW (ref 13.0–17.0)
Immature Granulocytes: 0 %
Lymphocytes Relative: 21 %
Lymphs Abs: 1.2 10*3/uL (ref 0.7–4.0)
MCH: 30.9 pg (ref 26.0–34.0)
MCHC: 33.2 g/dL (ref 30.0–36.0)
MCV: 93.2 fL (ref 80.0–100.0)
Monocytes Absolute: 0.4 10*3/uL (ref 0.1–1.0)
Monocytes Relative: 8 %
Neutro Abs: 4 10*3/uL (ref 1.7–7.7)
Neutrophils Relative %: 68 %
Platelet Count: 156 10*3/uL (ref 150–400)
RBC: 3.98 MIL/uL — ABNORMAL LOW (ref 4.22–5.81)
RDW: 12.1 % (ref 11.5–15.5)
WBC Count: 5.8 10*3/uL (ref 4.0–10.5)
nRBC: 0 % (ref 0.0–0.2)

## 2019-08-28 LAB — IRON AND TIBC
Iron: 115 ug/dL (ref 42–163)
Saturation Ratios: 38 % (ref 20–55)
TIBC: 305 ug/dL (ref 202–409)
UIBC: 190 ug/dL (ref 117–376)

## 2019-08-28 LAB — CMP (CANCER CENTER ONLY)
ALT: 20 U/L (ref 0–44)
AST: 22 U/L (ref 15–41)
Albumin: 4.5 g/dL (ref 3.5–5.0)
Alkaline Phosphatase: 75 U/L (ref 38–126)
Anion gap: 11 (ref 5–15)
BUN: 27 mg/dL — ABNORMAL HIGH (ref 8–23)
CO2: 28 mmol/L (ref 22–32)
Calcium: 9.2 mg/dL (ref 8.9–10.3)
Chloride: 104 mmol/L (ref 98–111)
Creatinine: 1.34 mg/dL — ABNORMAL HIGH (ref 0.61–1.24)
GFR, Est AFR Am: 57 mL/min — ABNORMAL LOW (ref >60)
GFR, Estimated: 49 mL/min — ABNORMAL LOW (ref >60)
Glucose, Bld: 103 mg/dL — ABNORMAL HIGH (ref 70–99)
Potassium: 4.4 mmol/L (ref 3.5–5.1)
Sodium: 143 mmol/L (ref 135–145)
Total Bilirubin: 0.5 mg/dL (ref 0.3–1.2)
Total Protein: 7.2 g/dL (ref 6.5–8.1)

## 2019-08-28 LAB — FERRITIN: Ferritin: 205 ng/mL (ref 24–336)

## 2019-08-28 LAB — VITAMIN B12: Vitamin B-12: 2269 pg/mL — ABNORMAL HIGH (ref 180–914)

## 2019-08-28 NOTE — Progress Notes (Signed)
[place] [phone]   [phone]  CONSULT NOTE  REFERRING PHYSICIAN: Dr. Madilyn Fireman MD  REASON FOR CONSULTATION:  Anemia  HPI Justin Mcgee is a 84 y.o. male with a past medical history significant for vitamin B12 deficiency for which he currently takes oral vitamin B12 supplements, abscess on his left buttock, cyst on his back, osteoarthritis, tinea skin infection, eczema, BPH, basal cell carcinoma, and history squamous cell carcinoma of the tongue presents the clinic for evaluation of anemia.  The patient states that he went to go see his primary care provider approximately 6 weeks ago or so regarding the abscess on his buttocks as well as a cyst on his back.  At this time, his labs were performed at his primary care providers office which noted mild normocytic anemia with a hemoglobin of 12.2.  The patient also had iron studies performed which was unremarkable.  The patient denies any prior history of anemia. the patient was referred to the clinic for further evaluation of his condition.   The patient is feeling well today without any concerning complaints.  He states that his fatigue is "better" at this time.  He denies any lightheadedness, palpitations, shortness of breath, or chest pain.  He denies any known bleeding or bruising including epistaxis, gingival bleeding, hematuria, melena, or hematochezia.  The patient states that his last colonoscopy was approximately 8 years ago which was reportedly normal.  He denies any particular dietary habits such as being a vegan or vegetarian.  The patient has osteoarthritis of his knees for which he is prescribed some type of pain medication by his orthopedic doctor, but he is unsure of the name of the medication.  The patient states that he does not take any over-the-counter pain medication such as ibuprofen or Tylenol for his osteoarthritis since he has been prescribed medication.  Patient denies any recent  surgeries or procedures. The patient notes some recent constipation secondary to his pain medication use.  He denies any nausea, vomiting, abdominal pain, or diarrhea.  The patient's past medical history significant for mother who reportedly had colorectal cancer, arthritis, and Alzheimer's disease.  His father had diabetes mellitus, hip fracture, Guillain-Barr, and aspiration pneumonia.  The patient used to be a [employer].  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  He is married and has 2 children.  He denies any alcohol or drug use.  The patient is a former smoker quit 41 years ago.  He states that he used to smoke approximately 1 pack for about 20 years.  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 Marica Otter, no radiation or chemo, clear LN.     Past Surgical History:  Procedure Laterality Date   APPENDECTOMY  1997   CATARACT EXTRACTION     CHOLECYSTECTOMY  05/16/10   in [place] while on vacation   [place] Left 1994   PARTIAL GLOSSECTOMY     with unilat neck dissection    Family History  Problem Relation Age of Onset   GER  disease Father    Other Father        Gilliam-Barre   Diabetes Father    Colon cancer Mother    Diabetes Other     Social History Social History   Tobacco Use   Smoking status: Former Smoker    Quit date: 02/19/1978    Years since quitting: 41.5   Smokeless tobacco: Never Used  Substance Use Topics   Alcohol use: No    Alcohol/week: 0.0 standard drinks   Drug use: No    Allergies  Allergen Reactions   Flomax [Tamsulosin Hcl] Other (See Comments)    Dizziness    Gabapentin Other (See Comments)    Unsteadiness and Balance problems.     Sulfa Antibiotics    Other Other (See Comments)    Topical sulfa only. Gets skin irritation.    Current Outpatient Medications  Medication Sig Dispense Refill   Ascorbic Acid (VITAMIN C PO) Take 1 capsule by mouth daily.     clotrimazole-betamethasone (LOTRISONE) cream Apply 1 application topically 2 (two) times daily. (Patient taking differently: Apply 1 application topically once a week. ) 90 g 0   Cyanocobalamin (VITAMIN B-12 PO) Take by mouth.     diclofenac (VOLTAREN) 75 MG EC tablet TAKE 1 TABLET TWICE A DAY 180 tablet 3   pantoprazole (PROTONIX) 40 MG tablet TAKE 1 TABLET TWICE A DAY 180 tablet 4   VITAMIN E PO Take by mouth.     ipratropium (ATROVENT) 0.06 % nasal spray Place 2 sprays into both nostrils 4 (four) times daily as needed for rhinitis. (Patient not taking: Reported on 08/28/2019) 15 mL 0   No current facility-administered medications for this visit.     Review of Systems REVIEW OF SYSTEMS:   Review of Systems  Constitutional: Negative for appetite change, chills, fatigue, fever and unexpected weight change.  HENT: Negative for mouth sores, nosebleeds, sore throat and trouble swallowing.   Eyes: Negative for eye problems and icterus.  Respiratory: Negative for cough, hemoptysis, shortness of breath and wheezing.   Cardiovascular: Negative for chest pain and leg swelling.  Gastrointestinal: Positive for constipation secondary to his pain medication use. Negative for abdominal pain, diarrhea, nausea and vomiting.  Genitourinary: Negative for bladder incontinence, difficulty urinating, dysuria, frequency and hematuria.   Musculoskeletal: Negative for back pain, gait problem, neck pain and neck stiffness.  Skin: Negative for itching and rash.  Neurological: Negative for dizziness, extremity weakness, gait problem, headaches, light-headedness and seizures.  Hematological: Negative for  adenopathy. Does not bruise/bleed easily.  Psychiatric/Behavioral: Negative for confusion, depression and sleep disturbance. The patient is not nervous/anxious.     PHYSICAL EXAMINATION:  Blood pressure 129/60, pulse 64, temperature 98.2 F (36.8 C), temperature source Temporal, resp. rate 17, height '6\' 3"'  (1.905 m), weight 184 lb 8 oz (83.7 kg), SpO2 98 %.  ECOG PERFORMANCE STATUS: 0  Physical Exam  Constitutional: Oriented to person, place, and time and well-developed, well-nourished, and in no distress.  HENT:  Head: Normocephalic and atraumatic.  Mouth/Throat: Oropharynx is clear and moist. No oropharyngeal exudate.  Eyes: Conjunctivae are normal. Right eye exhibits no discharge. Left eye exhibits no discharge. No scleral icterus.  Neck: Normal range of motion. Neck supple.  Cardiovascular: Normal rate, regular rhythm, normal heart sounds and intact distal pulses.   Pulmonary/Chest: Effort normal and breath sounds normal. No respiratory distress. No wheezes. No rales.  Abdominal: Soft. Bowel sounds are normal. Exhibits no distension and no mass. There is no tenderness.  Musculoskeletal: Normal range of motion. Exhibits no edema.  Lymphadenopathy:    No cervical adenopathy.  Neurological: Alert and oriented to person, place, and time. Exhibits normal muscle tone. Gait normal. Coordination normal.  Skin: Skin is warm and dry. No rash noted. Not diaphoretic. No erythema. No pallor.  Psychiatric: Mood, memory and judgment normal.  Vitals reviewed.  LABORATORY DATA: Lab Results  Component Value Date   WBC 5.8 08/28/2019   HGB 12.3 (L) 08/28/2019   HCT 37.1 (L) 08/28/2019   MCV 93.2 08/28/2019   PLT 156 08/28/2019      Chemistry      Component Value Date/Time   NA 143 08/28/2019 1332   K 4.4 08/28/2019 1332   CL 104 08/28/2019 1332   CO2 28 08/28/2019 1332   BUN 27 (H) 08/28/2019 1332   CREATININE 1.34 (H) 08/28/2019 1332   CREATININE 1.22 (H) 02/09/2018 1004        Component Value Date/Time   CALCIUM 9.2 08/28/2019 1332   ALKPHOS 75 08/28/2019 1332   AST 22 08/28/2019 1332   ALT 20 08/28/2019 1332   BILITOT 0.5 08/28/2019 1332       RADIOGRAPHIC STUDIES: No results found.  ASSESSMENT: This is a very pleasant 84 year old Caucasian male referred to the clinic for evaluation of mild anemia.    PLAN: The patient was seen with Dr. Julien Nordmann today. Several lab studies were  performed today to evaluate his condition further including a CBC, CMP, LDH, erythropoietin, ferritin, iron studies, folate, B12, SPEP with immunofixation, and heavy metals.   His CBC today showed persistent mild normocytic anemia with a hemoglobin of 12.3 today.  His other lab studies are pending at this time.  The patient was also given fecal occult stool cards to assess for GI blood loss.   We will see the patient back for follow-up visit in 3 months with repeat CBC. If his hemoglobin continues to be low or to trend downwards, we may consider a bone marrow biopsy in the future.   If any of the pending lab studies are abnormal, we will reach out to the patient to see him sooner if needed.   The patient will continue to take his B12 supplements. Dr. Julien Nordmann recommends that the patient take a one a day multivitamin.   The patient voices understanding of current disease status and treatment options and is in agreement with the current care plan.  All questions were answered. The patient knows to call the clinic with any problems, questions or concerns. We can certainly see the patient much sooner if necessary.  Thank you so much for allowing me to participate in the care of [employer]. I will continue to follow up the patient with you and assist in his care.  I spent 40 minutes counseling the patient face to face. The total time spent in the appointment was 60 minutes.  Disclaimer: This note was dictated with voice recognition software. Similar sounding words can  inadvertently be transcribed and may not be corrected upon review.   Owyn Raulston L Hailyn Zarr August 28, 2019, 3:07 PM   ADDENDUM: Hematology/Oncology Attending: I had a face-to-face encounter with the patient today.  I recommended his care plan.  This is a very pleasant 84 years old white male who was referred to [place] today for evaluation of mild anemia.  The patient was seen recently by his primary care physician and repeat blood work showed hemoglobin down to 12.2.  He had previous iron studies that  was unremarkable.  The patient is here today with no concerning complaints.  He has no rectal bleeding or any other bleeding issues. I had a lengthy discussion with the patient today about his condition and further investigation to confirm the etiology of his mild anemia. We ordered several studies today including repeat iron study and ferritin, serum folate, serum vitamin B12 level in addition to serum protein electrophoresis with immunofixation as well as heavy metals. We will also check his stool cards to rule out any rectal bleeding. We recommended for the patient to take multivitamins with iron. We will see him back for follow-up visit in 3 months for reevaluation and repeat CBC.  If he has no improvement in his condition, we may consider the patient for a bone marrow biopsy and aspirate if needed. The patient was advised to call immediately if he has any other concerning symptoms in the interval.  Disclaimer: This note was dictated with voice recognition software. Similar sounding words can inadvertently be transcribed and may be missed upon review. Eilleen Kempf, MD 08/28/19

## 2019-08-28 NOTE — Telephone Encounter (Signed)
Scheduled appt per 11/16 los.  Left a VM of the appt date and time.

## 2019-08-29 ENCOUNTER — Telehealth: Payer: Self-pay | Admitting: Physician Assistant

## 2019-08-29 LAB — HEAVY METALS, BLOOD
Arsenic: 6 ug/L (ref 2–23)
Lead: <1 ug/dL (ref 0–4)
Mercury: <1 ug/L (ref 0.0–14.9)

## 2019-08-29 LAB — PROTEIN ELECTROPHORESIS, SERUM, WITH REFLEX
A/G Ratio: 1.6 (ref 0.7–1.7)
Albumin ELP: 4.2 g/dL (ref 2.9–4.4)
Alpha-1-Globulin: 0.2 g/dL (ref 0.0–0.4)
Alpha-2-Globulin: 0.6 g/dL (ref 0.4–1.0)
Beta Globulin: 0.9 g/dL (ref 0.7–1.3)
Gamma Globulin: 1 g/dL (ref 0.4–1.8)
Globulin, Total: 2.7 g/dL (ref 2.2–3.9)
Total Protein ELP: 6.9 g/dL (ref 6.0–8.5)

## 2019-08-29 LAB — ERYTHROPOIETIN: Erythropoietin: 4.7 m[IU]/mL (ref 2.6–18.5)

## 2019-08-29 NOTE — Telephone Encounter (Signed)
Called the patient to follow up on his labs from yesterday. Unable to reach the patient so I left a voicemail to return our call. I was going to discuss with him that his labs are unremarkable. His vitamin B12 is elevated since he takes supplemental B12 which is not necessary and daily multivitamin would be adequate. I also was going to remind him to with the stool cards and to return those to the clinic once completed.

## 2019-08-31 ENCOUNTER — Telehealth: Payer: Self-pay | Admitting: *Deleted

## 2019-08-31 NOTE — Telephone Encounter (Signed)
Patient returned call.  Reviewed labs.  No further questions.  Mailing copy of labs to patients home address.

## 2019-08-31 NOTE — Telephone Encounter (Signed)
Per Cassie Heilingoetter, PA instructed to let patient know that his labs were normal and no cause of anemia in regard to labs were found.  Plan to recheck in 3 months.  Also advised to let him know that his vitamin b12 lab was elevated.  We are aware that he takes a supplement of B12 and he can just switch to a regular multivitamin instead of a isolated B12 vitamin.  LM for call back to advise all of the above.  Pending returned call.

## 2019-09-12 ENCOUNTER — Other Ambulatory Visit: Payer: Self-pay | Admitting: Physician Assistant

## 2019-09-12 ENCOUNTER — Inpatient Hospital Stay: Payer: Medicare Other | Attending: Internal Medicine

## 2019-09-12 DIAGNOSIS — D649 Anemia, unspecified: Secondary | ICD-10-CM | POA: Diagnosis not present

## 2019-09-12 LAB — OCCULT BLOOD X 1 CARD TO LAB, STOOL
Fecal Occult Bld: NEGATIVE
Fecal Occult Bld: NEGATIVE
Fecal Occult Bld: NEGATIVE

## 2019-09-15 ENCOUNTER — Telehealth: Payer: Self-pay | Admitting: Physician Assistant

## 2019-09-15 NOTE — Telephone Encounter (Signed)
Attempted to call patient to let him know his stool cards were negative for occult blood. Phone continued to ring without going to voicemail so was unable to leave a message for the patient to return the call.

## 2019-09-18 DIAGNOSIS — Z961 Presence of intraocular lens: Secondary | ICD-10-CM | POA: Diagnosis not present

## 2019-11-28 ENCOUNTER — Other Ambulatory Visit: Payer: Self-pay

## 2019-11-28 ENCOUNTER — Encounter: Payer: Self-pay | Admitting: Internal Medicine

## 2019-11-28 ENCOUNTER — Inpatient Hospital Stay: Payer: Medicare Other | Attending: Internal Medicine

## 2019-11-28 ENCOUNTER — Inpatient Hospital Stay (HOSPITAL_BASED_OUTPATIENT_CLINIC_OR_DEPARTMENT_OTHER): Payer: Medicare Other | Admitting: Internal Medicine

## 2019-11-28 VITALS — BP 133/90 | HR 58 | Temp 98.2°F | Resp 18 | Ht 75.0 in | Wt 183.5 lb

## 2019-11-28 DIAGNOSIS — Z79899 Other long term (current) drug therapy: Secondary | ICD-10-CM | POA: Insufficient documentation

## 2019-11-28 DIAGNOSIS — Z87891 Personal history of nicotine dependence: Secondary | ICD-10-CM | POA: Insufficient documentation

## 2019-11-28 DIAGNOSIS — D649 Anemia, unspecified: Secondary | ICD-10-CM

## 2019-11-28 DIAGNOSIS — Z85828 Personal history of other malignant neoplasm of skin: Secondary | ICD-10-CM | POA: Diagnosis not present

## 2019-11-28 DIAGNOSIS — Z85818 Personal history of malignant neoplasm of other sites of lip, oral cavity, and pharynx: Secondary | ICD-10-CM | POA: Insufficient documentation

## 2019-11-28 DIAGNOSIS — D539 Nutritional anemia, unspecified: Secondary | ICD-10-CM

## 2019-11-28 DIAGNOSIS — Z791 Long term (current) use of non-steroidal anti-inflammatories (NSAID): Secondary | ICD-10-CM | POA: Insufficient documentation

## 2019-11-28 LAB — CBC WITH DIFFERENTIAL (CANCER CENTER ONLY)
Abs Immature Granulocytes: 0.01 10*3/uL (ref 0.00–0.07)
Basophils Absolute: 0 10*3/uL (ref 0.0–0.1)
Basophils Relative: 1 %
Eosinophils Absolute: 0.2 10*3/uL (ref 0.0–0.5)
Eosinophils Relative: 5 %
HCT: 40.3 % (ref 39.0–52.0)
Hemoglobin: 13.3 g/dL (ref 13.0–17.0)
Immature Granulocytes: 0 %
Lymphocytes Relative: 32 %
Lymphs Abs: 1.6 10*3/uL (ref 0.7–4.0)
MCH: 30.9 pg (ref 26.0–34.0)
MCHC: 33 g/dL (ref 30.0–36.0)
MCV: 93.5 fL (ref 80.0–100.0)
Monocytes Absolute: 0.4 10*3/uL (ref 0.1–1.0)
Monocytes Relative: 7 %
Neutro Abs: 2.7 10*3/uL (ref 1.7–7.7)
Neutrophils Relative %: 55 %
Platelet Count: 149 10*3/uL — ABNORMAL LOW (ref 150–400)
RBC: 4.31 MIL/uL (ref 4.22–5.81)
RDW: 11.9 % (ref 11.5–15.5)
WBC Count: 4.9 10*3/uL (ref 4.0–10.5)
nRBC: 0 % (ref 0.0–0.2)

## 2019-11-28 NOTE — Progress Notes (Signed)
[place] [phone]   [phone]  OFFICE PROGRESS NOTE  Hali Marry, MD [id number] [place] [address]  DIAGNOSIS: Mild anemia of unknown etiology.  Resolved  PRIOR THERAPY: None  CURRENT THERAPY: None  INTERVAL HISTORY: Justin Mcgee 85 y.o. male returns to the clinic today for follow-up visit.  The patient is feeling fine today with no concerning complaints.  He denied having any fatigue or weakness.  He denied having any chest pain, shortness of breath, cough or hemoptysis.  He denied having any fever or chills.  He has no nausea, vomiting, diarrhea or constipation.  He is currently taking multivitamins.  He had several studies for evaluation of his anemia that were unremarkable and this is included serum protein electrophoresis with immunofixation, iron study and ferritin, serum folate and vitamin B12 level.  The patient is here today for evaluation and repeat CBC.  MEDICAL HISTORY: Past Medical History:  Diagnosis Date  . 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 Former smoker   . Fracture of right radius 1956  . Fracture, metacarpal 1956   1, 2, 3  . Fracture, tibia 1964  . 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 Marica Otter, no radiation or chemo, clear LN.     ALLERGIES:  is allergic to flomax [tamsulosin hcl]; gabapentin; sulfa antibiotics; and other.  MEDICATIONS:  Current Outpatient Medications  Medication Sig Dispense Refill  . Ascorbic Acid (VITAMIN C PO) Take 1 capsule by mouth daily.    . clotrimazole-betamethasone (LOTRISONE) cream Apply 1 application topically 2 (two) times daily. (Patient taking differently: Apply 1 application topically once a week. ) 90 g 0  . Cyanocobalamin (VITAMIN B-12 PO) Take by mouth.    . diclofenac (VOLTAREN) 75 MG EC tablet TAKE 1 TABLET TWICE A DAY 180 tablet 3  . ipratropium (ATROVENT) 0.06 % nasal spray Place 2 sprays into  both nostrils 4 (four) times daily as needed for rhinitis. (Patient not taking: Reported on 08/28/2019) 15 mL 0  . pantoprazole (PROTONIX) 40 MG tablet TAKE 1 TABLET TWICE A DAY 180 tablet 4  . VITAMIN E PO Take by mouth.     No current facility-administered medications for this visit.    SURGICAL HISTORY:  Past Surgical History:  Procedure Laterality Date  . APPENDECTOMY  1997  . CATARACT EXTRACTION    . CHOLECYSTECTOMY  05/16/10   in [place] while on vacation  . [place]  . INGUINAL HERNIA REPAIR Left 1994  . PARTIAL GLOSSECTOMY     with unilat neck dissection    REVIEW OF SYSTEMS:  A comprehensive review of systems was negative.   PHYSICAL EXAMINATION: General appearance: alert, cooperative and no distress Head: Normocephalic, without obvious abnormality, atraumatic Neck: no adenopathy, no JVD, supple, symmetrical, trachea midline and thyroid not enlarged, symmetric, no tenderness/mass/nodules Lymph nodes: Cervical, supraclavicular, and axillary nodes normal. Resp: clear to auscultation bilaterally Back: symmetric, no curvature. ROM normal. No CVA tenderness. Cardio: regular rate and rhythm, S1, S2 normal, no murmur, click, rub or gallop GI: soft, non-tender; bowel sounds normal; no masses,  no organomegaly Extremities: extremities normal, atraumatic, no cyanosis or edema  ECOG PERFORMANCE STATUS: 1 - Symptomatic but completely ambulatory  Blood pressure 133/90, pulse (!) 58, temperature 98.2 F (36.8 C), temperature source Temporal, resp. rate 18, height 6\' 3"  (1.905 m), weight  183 lb 8 oz (83.2 kg), SpO2 100 %.  LABORATORY DATA: Lab Results  Component Value Date   WBC 4.9 11/28/2019   HGB 13.3 11/28/2019   HCT 40.3 11/28/2019   MCV 93.5 11/28/2019   PLT 149 (L) 11/28/2019      Chemistry      Component Value Date/Time   NA 143 08/28/2019 1332   K 4.4 08/28/2019 1332   CL 104 08/28/2019 1332   CO2 28 08/28/2019 1332   BUN 27 (H)  08/28/2019 1332   CREATININE 1.34 (H) 08/28/2019 1332   CREATININE 1.22 (H) 02/09/2018 1004      Component Value Date/Time   CALCIUM 9.2 08/28/2019 1332   ALKPHOS 75 08/28/2019 1332   AST 22 08/28/2019 1332   ALT 20 08/28/2019 1332   BILITOT 0.5 08/28/2019 1332       RADIOGRAPHIC STUDIES: No results found.  ASSESSMENT AND PLAN: This is a very pleasant 85 years old white male with history of mild anemia that is currently resolved.  This could be deficiency anemia versus anemia of chronic disease secondary to renal dysfunction.  The patient is doing fine today. His previous anemia panel showed no concerning abnormalities. I recommended for the patient to continue on observation with routine follow-up visit by his primary care physician at this point. I will see the patient on as-needed basis if he has any concerning findings in the future. He was advised to call if he has any other hematologic issues. The patient voices understanding of current disease status and treatment options and is in agreement with the current care plan. All questions were answered. The patient knows to call the clinic with any problems, questions or concerns. We can certainly see the patient much sooner if necessary.   Disclaimer: This note was dictated with voice recognition software. Similar sounding words can inadvertently be transcribed and may not be corrected upon review.

## 2019-12-19 NOTE — Progress Notes (Signed)
Subjective:   Justin Mcgee is a 85 y.o. male who presents for Medicare Annual/Subsequent preventive examination.  Review of Systems:  No ROS.  Medicare Wellness Virtual Visit.  Visual/audio telehealth visit, UTA vital signs.   See social history for additional risk factors.    Cardiac Risk Factors include: advanced age (>70men, >56 women);male gender  Sleep patterns: Getting at least 8 hours of sleep a night. Wakes up 1 time a night to void.Wakes up feels rested and ready for the day. Home Safety/Smoke Alarms: Feels safe in home. Smoke alarms in place.  Living environment; Lives with wife in a 2 story home and stairs have hand rails on them. Shower is a step over tub combo and no grab bars in place. Seat Belt Safety/Bike Helmet: Wears seat belt.  Male:   CCS- Aged out     PSA- ordered while in office Lab Results  Component Value Date   PSA 5.8 (H) 02/09/2018   PSA 4.7 (H) 02/05/2017   PSA 6.09 (H) 07/22/2015        Objective:    Vitals: BP (!) 127/59   Pulse 75   Ht 6\' 3"  (1.905 m)   Wt 184 lb (83.5 kg)   SpO2 100%   BMI 23.00 kg/m   Body mass index is 23 kg/m.  Advanced Directives 12/26/2019 02/04/2018 06/01/2016 08/12/2015 07/17/2015 03/02/2014 01/18/2014  Does Patient Have a Medical Advance Directive? No No No No No Patient does not have advance directive;Patient would like information Patient does not have advance directive;Patient would like information  Would patient like information on creating a medical advance directive? No - Patient declined No - Patient declined - No - patient declined information No - patient declined information;Yes - Scientist, clinical (histocompatibility and immunogenetics) given Advance directive brochure given (Outpatient ONLY) Advance directive packet given    Tobacco Social History   Tobacco Use  Smoking Status Former Smoker  . Quit date: 02/19/1978  . Years since quitting: 41.8  Smokeless Tobacco Never Used     Counseling given: No   Clinical Intake:  Pre-visit  preparation completed: Yes  Pain : No/denies pain Pain Score: 0-No pain     Nutritional Risks: None Diabetes: No  How often do you need to have someone help you when you read instructions, pamphlets, or other written materials from your doctor or pharmacy?: 1 - Never What is the last grade level you completed in school?: 14  Interpreter Needed?: No  Information entered by :: Orlie Dakin, LPN  Past Medical History:  Diagnosis Date  . 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 Former smoker   . Fracture of right radius 1956  . Fracture, metacarpal 1956   1, 2, 3  . Fracture, tibia 1964  . 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 Marica Otter, no radiation or chemo, clear LN.    Past Surgical History:  Procedure Laterality Date  . APPENDECTOMY  1997  . CATARACT EXTRACTION    . CHOLECYSTECTOMY  05/16/10   in [place] while on vacation  . [place]  . INGUINAL HERNIA REPAIR Left 1994  . PARTIAL GLOSSECTOMY     with unilat neck dissection   Family History  Problem Relation Age of Onset  . GER disease Father   . Other Father        [place]  . Diabetes Father   . Colon cancer Mother   . Diabetes  Other    Social History   Socioeconomic History  . Marital status: Married    Spouse name: Justin Mcgee   . Number of children: 2  . Years of education: 50  . Highest education level: Some college, no degree  Occupational History  . Occupation: Community education officer    Employer: [employer]  Tobacco Use  . Smoking status: Former Smoker    Quit date: 02/19/1978    Years since quitting: 41.8  . Smokeless tobacco: Never Used  Substance and Sexual Activity  . Alcohol use: No    Alcohol/week: 0.0 standard drinks  . Drug use: No  . Sexual activity: Yes    Partners: Female  Other Topics Concern  . Not on file  Social History Narrative   4 caffeine drink per day.  40 min cardio/strength 6 days per week. Retired  from [employer].             Social Determinants of Health   Financial Resource Strain:   . Difficulty of Paying Living Expenses:   Food Insecurity:   . Worried About Charity fundraiser in the Last Year:   . Arboriculturist in the Last Year:   Transportation Needs:   . Film/video editor (Medical):   [name] Lack of Transportation (Non-Medical):   Physical Activity:   . Days of Exercise per Week:   . Minutes of Exercise per Session:   Stress:   . Feeling of Stress :   Social Connections:   . Frequency of Communication with Friends and Family:   . Frequency of Social Gatherings with Friends and Family:   . Attends Religious Services:   . Active Member of Clubs or Organizations:   . Attends Archivist Meetings:   [name] Marital Status:     Outpatient Encounter Medications as of 12/26/2019  Medication Sig  . Ascorbic Acid (VITAMIN C PO) Take 1 capsule by mouth daily.  . pantoprazole (PROTONIX) 40 MG tablet TAKE 1 TABLET TWICE A DAY  . clotrimazole-betamethasone (LOTRISONE) cream Apply 1 application topically 2 (two) times daily. (Patient not taking: Reported on 12/26/2019)  . Cyanocobalamin (VITAMIN B-12 PO) Take by mouth.  . diclofenac (VOLTAREN) 75 MG EC tablet TAKE 1 TABLET TWICE A DAY (Patient not taking: Reported on 12/26/2019)  . ipratropium (ATROVENT) 0.06 % nasal spray Place 2 sprays into both nostrils 4 (four) times daily as needed for rhinitis. (Patient not taking: Reported on 08/28/2019)  . VITAMIN E PO Take by mouth.   No facility-administered encounter medications on file as of 12/26/2019.    Activities of Daily Living In your present state of health, do you have any difficulty performing the following activities: 12/26/2019  Hearing? Y  Comment wears hearing aids bilateraly  Vision? N  Difficulty concentrating or making decisions? N  Walking or climbing stairs? N  Dressing or bathing? N  Doing errands, shopping? N  Preparing Food and eating ? N   Using the Toilet? N  In the past six months, have you accidently leaked urine? Y  Comment wears pad  Do you have problems with loss of bowel control? N  Managing your Medications? N  Managing your Finances? N  Housekeeping or managing your Housekeeping? N  Some recent data might be hidden    Patient Care Team: Hali Marry, MD as PCP - General (Family Medicine)   Assessment:   This is a routine wellness examination for [place].Physical assessment deferred to PCP.  Exercise Activities and Dietary recommendations Current Exercise Habits: Home exercise routine, Type of exercise: strength training/weights;stretching, Time (Minutes): 15, Frequency (Times/Week): 2, Weekly Exercise (Minutes/Week): 30, Intensity: Mild Diet Eats a healthy diet of vegetables, fruits and proteins. Breakfast: Oatmeal or granola Lunch: fruit and crackers Dinner:Meat and vegetables and carb with salad. Drinks 2 liters of water daily.      Goals    . Patient Stated     Patient stated would like to drink more water.       Fall Risk Fall Risk  12/26/2019 05/10/2019 02/03/2018 11/19/2016 07/17/2015  Falls in the past year? 1 0 No No No  Comment - [facility] Telephone Survey: data to providers prior to load - - -  Number falls in past yr: 1 - - - -  Injury with Fall? 0 - - - -  Risk for fall due to : Impaired balance/gait - - - -  Follow up Falls prevention discussed - - - -   Is the patient's home free of loose throw rugs in walkways, pet beds, electrical cords, etc?   yes      Grab bars in the bathroom? no      Handrails on the stairs?   yes      Adequate lighting?   yes   Depression Screen PHQ 2/9 Scores 12/26/2019 10/14/2018 02/03/2018 02/02/2017  PHQ - 2 Score 0 0 0 0  PHQ- 9 Score - 1 - -    Cognitive Function     6CIT Screen 12/26/2019 02/03/2018  What Year? 0 points 0 points  What month? 0 points 0 points  What time? 0 points 0 points  Count back from 20 0 points 0 points  Months in reverse  0 points 0 points  Repeat phrase 4 points 0 points  Total Score 4 0    Immunization History  Administered Date(s) Administered  . Influenza Split 09/18/2011  . Influenza, High Dose Seasonal PF 08/04/2017, 08/09/2018  . Influenza,inj,Quad PF,6+ Mos 07/17/2015, 07/01/2016  . Pneumococcal Conjugate-13 07/17/2015  . Pneumococcal-Unspecified 06/04/2009  . Td 10/13/2007    Screening Tests Health Maintenance  Topic Date Due  . TETANUS/TDAP  10/12/2017  . INFLUENZA VACCINE  06/12/2020 (Originally 05/13/2019)  . PNA vac Low Risk Adult  Completed      Plan:    Please schedule your next medicare wellness visit with me in 1 yr.  Mr. Jeanpierre , Thank you for taking time to come for your Medicare Wellness Visit. I appreciate your ongoing commitment to your health goals. Please review the following plan we discussed and let me know if I can assist you in the future.   These are the goals we discussed: Goals    . Patient Stated     Patient stated would like to drink more water.       This is a list of the screening recommended for you and due dates:  Health Maintenance  Topic Date Due  . Tetanus Vaccine  10/12/2017  . Flu Shot  06/12/2020*  . Pneumonia vaccines  Completed  *Topic was postponed. The date shown is not the original due date.     I have personally reviewed and noted the following in the patient's chart:   . Medical and social history . Use of alcohol, tobacco or illicit drugs  . Current medications and supplements . Functional ability and status . Nutritional status . Physical activity . Advanced directives . List of other physicians . Hospitalizations, surgeries, and ER  visits in previous 12 months . Vitals . Screenings to include cognitive, depression, and falls . Referrals and appointments  In addition, I have reviewed and discussed with patient certain preventive protocols, quality metrics, and best practice recommendations. A written personalized care plan for  preventive services as well as general preventive health recommendations were provided to patient.     Joanne Chars, LPN  [id number]

## 2019-12-26 ENCOUNTER — Other Ambulatory Visit: Payer: Self-pay

## 2019-12-26 ENCOUNTER — Ambulatory Visit (INDEPENDENT_AMBULATORY_CARE_PROVIDER_SITE_OTHER): Payer: Medicare Other | Admitting: *Deleted

## 2019-12-26 VITALS — BP 127/59 | HR 75 | Ht 75.0 in | Wt 184.0 lb

## 2019-12-26 DIAGNOSIS — R351 Nocturia: Secondary | ICD-10-CM | POA: Diagnosis not present

## 2019-12-26 DIAGNOSIS — Z125 Encounter for screening for malignant neoplasm of prostate: Secondary | ICD-10-CM | POA: Diagnosis not present

## 2019-12-26 DIAGNOSIS — Z0000 Encounter for general adult medical examination without abnormal findings: Secondary | ICD-10-CM

## 2019-12-26 LAB — PSA: PSA: 5.5 ng/mL — ABNORMAL HIGH (ref <=4.0)

## 2019-12-26 NOTE — Patient Instructions (Signed)
Please schedule your next medicare wellness visit with me in 1 yr.  Justin Mcgee , Thank you for taking time to come for your Medicare Wellness Visit. I appreciate your ongoing commitment to your health goals. Please review the following plan we discussed and let me know if I can assist you in the future.   These are the goals we discussed: Goals    . Patient Stated     Patient stated would like to drink more water.       Health Maintenance After Age 83 After age 70, you are at a higher risk for certain long-term diseases and infections as well as injuries from falls. Falls are a major cause of broken bones and head injuries in people who are older than age 85. Getting regular preventive care can help to keep you healthy and well. Preventive care includes getting regular testing and making lifestyle changes as recommended by your health care provider. Talk with your health care provider about:  Which screenings and tests you should have. A screening is a test that checks for a disease when you have no symptoms.  A diet and exercise plan that is right for you. What should I know about screenings and tests to prevent falls? Screening and testing are the best ways to find a health problem early. Early diagnosis and treatment give you the best chance of managing medical conditions that are common after age 34. Certain conditions and lifestyle choices may make you more likely to have a fall. Your health care provider may recommend:  Regular vision checks. Poor vision and conditions such as cataracts can make you more likely to have a fall. If you wear glasses, make sure to get your prescription updated if your vision changes.  Medicine review. Work with your health care provider to regularly review all of the medicines you are taking, including over-the-counter medicines. Ask your health care provider about any side effects that may make you more likely to have a fall. Tell your health care provider  if any medicines that you take make you feel dizzy or sleepy.  Osteoporosis screening. Osteoporosis is a condition that causes the bones to get weaker. This can make the bones weak and cause them to break more easily.  Blood pressure screening. Blood pressure changes and medicines to control blood pressure can make you feel dizzy.  Strength and balance checks. Your health care provider may recommend certain tests to check your strength and balance while standing, walking, or changing positions.  Foot health exam. Foot pain and numbness, as well as not wearing proper footwear, can make you more likely to have a fall.  Depression screening. You may be more likely to have a fall if you have a fear of falling, feel emotionally low, or feel unable to do activities that you used to do.  Alcohol use screening. Using too much alcohol can affect your balance and may make you more likely to have a fall. What actions can I take to lower my risk of falls? General instructions  Talk with your health care provider about your risks for falling. Tell your health care provider if: ? You fall. Be sure to tell your health care provider about all falls, even ones that seem minor. ? You feel dizzy, sleepy, or off-balance.  Take over-the-counter and prescription medicines only as told by your health care provider. These include any supplements.  Eat a healthy diet and maintain a healthy weight. A healthy diet includes low-fat  dairy products, low-fat (lean) meats, and fiber from whole grains, beans, and lots of fruits and vegetables. Home safety  Remove any tripping hazards, such as rugs, cords, and clutter.  Install safety equipment such as grab bars in bathrooms and safety rails on stairs.  Keep rooms and walkways well-lit. Activity   Follow a regular exercise program to stay fit. This will help you maintain your balance. Ask your health care provider what types of exercise are appropriate for you.  If  you need a cane or walker, use it as recommended by your health care provider.  Wear supportive shoes that have nonskid soles. Lifestyle  Do not drink alcohol if your health care provider tells you not to drink.  If you drink alcohol, limit how much you have: ? 0-1 drink a day for women. ? 0-2 drinks a day for men.  Be aware of how much alcohol is in your drink. In the [place] one drink equals one typical bottle of beer (12 oz), one-half glass of wine (5 oz), or one shot of hard liquor (1 oz).  Do not use any products that contain nicotine or tobacco, such as cigarettes and e-cigarettes. If you need help quitting, ask your health care provider. Summary  Having a healthy lifestyle and getting preventive care can help to protect your health and wellness after age 77.  Screening and testing are the best way to find a health problem early and help you avoid having a fall. Early diagnosis and treatment give you the best chance for managing medical conditions that are more common for people who are older than age 68.  Falls are a major cause of broken bones and head injuries in people who are older than age 13. Take precautions to prevent a fall at home.  Work with your health care provider to learn what changes you can make to improve your health and wellness and to prevent falls. This information is not intended to replace advice given to you by your health care provider. Make sure you discuss any questions you have with your health care provider. Document Revised: 01/19/2019 Document Reviewed: 08/11/2017 Elsevier Patient Education  2020 [employer].

## 2020-01-02 DIAGNOSIS — Z23 Encounter for immunization: Secondary | ICD-10-CM | POA: Diagnosis not present

## 2020-01-04 ENCOUNTER — Ambulatory Visit (INDEPENDENT_AMBULATORY_CARE_PROVIDER_SITE_OTHER): Payer: Medicare Other | Admitting: Family Medicine

## 2020-01-04 ENCOUNTER — Other Ambulatory Visit: Payer: Self-pay

## 2020-01-04 ENCOUNTER — Encounter: Payer: Self-pay | Admitting: Family Medicine

## 2020-01-04 VITALS — BP 132/63 | HR 61 | Ht 75.0 in | Wt 179.0 lb

## 2020-01-04 DIAGNOSIS — L820 Inflamed seborrheic keratosis: Secondary | ICD-10-CM | POA: Diagnosis not present

## 2020-01-04 DIAGNOSIS — Z23 Encounter for immunization: Secondary | ICD-10-CM

## 2020-01-04 DIAGNOSIS — N1831 Chronic kidney disease, stage 3a: Secondary | ICD-10-CM

## 2020-01-04 DIAGNOSIS — N183 Chronic kidney disease, stage 3 unspecified: Secondary | ICD-10-CM | POA: Insufficient documentation

## 2020-01-04 DIAGNOSIS — Z79899 Other long term (current) drug therapy: Secondary | ICD-10-CM

## 2020-01-04 DIAGNOSIS — M1712 Unilateral primary osteoarthritis, left knee: Secondary | ICD-10-CM

## 2020-01-04 MED ORDER — TETANUS-DIPHTH-ACELL PERTUSSIS 5-2.5-18.5 LF-MCG/0.5 IM SUSP: 0.5000 mL | Freq: Once | INTRAMUSCULAR | 0 refills | Status: AC

## 2020-01-04 NOTE — Assessment & Plan Note (Addendum)
Due to recheck renal function.  Also recheck urine microalbumin level.  He did stop his NSAID which she had been taking for knee pain about a week ago.  Encouraged him to continue to avoid all NSAIDs.  We could try something like a topical Voltaren gel.

## 2020-01-04 NOTE — Progress Notes (Signed)
Established Patient Office Visit  Subjective:  Patient ID: Justin Mcgee, male    DOB: September 24, 1935  Age: 85 y.o. MRN: [id number]  CC:  Chief Complaint  Patient presents with  . Knee Pain    L knee    HPI HEARL NARTKER presents for Left knee pain -he actually had an x-ray done about 6 sugars ago which showed some pretty severe arthritis in the medial compartment in particular he wears knee braces bilaterally the left knee has been a little bit more bothersome.  He says not really interested in having knee replacement surgery at this point but is interested in maybe getting an injection.  He was taking Voltaren tabs but says that when he got it through the mail order he actually read the potential side effects and so decided to stop it about a week ago.  He was concerned that that could be affecting his kidney function.  He also has several skin lesions today that he would like me to look at.  He thinks that they are "warts".  There are a few that are irritated.  Particularly one on his left forearm one on his right upper arm and one on his left upper chest  Past Medical History:  Diagnosis Date  . 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 Former smoker   . Fracture of right radius 1956  . Fracture, metacarpal 1956   1, 2, 3  . Fracture, tibia 1964  . 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 Marica Otter, no radiation or chemo, clear LN.     Past Surgical History:  Procedure Laterality Date  . APPENDECTOMY  1997  . CATARACT EXTRACTION    . CHOLECYSTECTOMY  05/16/10   in [place] while on vacation  . [place]  . INGUINAL HERNIA REPAIR Left 1994  . PARTIAL GLOSSECTOMY     with unilat neck dissection    Family History  Problem Relation Age of Onset  . GER disease Father   . Other Father        [place]  . Diabetes Father   . Colon cancer Mother   . Diabetes Other     Social History   Socioeconomic  History  . Marital status: Married    Spouse name: Jaad Boilard   . Number of children: 2  . Years of education: 63  . Highest education level: Some college, no degree  Occupational History  . Occupation: Community education officer    Employer: [employer]  Tobacco Use  . Smoking status: Former Smoker    Quit date: 02/19/1978    Years since quitting: 41.9  . Smokeless tobacco: Never Used  Substance and Sexual Activity  . Alcohol use: No    Alcohol/week: 0.0 standard drinks  . Drug use: No  . Sexual activity: Yes    Partners: Female  Other Topics Concern  . Not on file  Social History Narrative   4 caffeine drink per day.  40 min cardio/strength 6 days per week. Retired from [employer].             Social Determinants of Health   Financial Resource Strain:   . Difficulty of Paying Living Expenses:   Food Insecurity:   . Worried About Charity fundraiser in the Last Year:   . Arboriculturist in the Last Year:   Transportation Needs:   .  Lack of Transportation (Medical):   [name] Lack of Transportation (Non-Medical):   Physical Activity:   . Days of Exercise per Week:   . Minutes of Exercise per Session:   Stress:   . Feeling of Stress :   Social Connections:   . Frequency of Communication with Friends and Family:   . Frequency of Social Gatherings with Friends and Family:   . Attends Religious Services:   . Active Member of Clubs or Organizations:   . Attends Archivist Meetings:   [name] Marital Status:   Intimate Partner Violence:   . Fear of Current or Ex-Partner:   . Emotionally Abused:   [name] Physically Abused:   . Sexually Abused:     Outpatient Medications Prior to Visit  Medication Sig Dispense Refill  . pantoprazole (PROTONIX) 40 MG tablet TAKE 1 TABLET TWICE A DAY 180 tablet 4  . VITAMIN E PO Take by mouth.    . Ascorbic Acid (VITAMIN C PO) Take 1 capsule by mouth daily.    . clotrimazole-betamethasone (LOTRISONE) cream Apply 1 application topically 2  (two) times daily. (Patient not taking: Reported on 12/26/2019) 90 g 0  . Cyanocobalamin (VITAMIN B-12 PO) Take by mouth.    . diclofenac (VOLTAREN) 75 MG EC tablet TAKE 1 TABLET TWICE A DAY (Patient not taking: Reported on 12/26/2019) 180 tablet 3  . ipratropium (ATROVENT) 0.06 % nasal spray Place 2 sprays into both nostrils 4 (four) times daily as needed for rhinitis. (Patient not taking: Reported on 08/28/2019) 15 mL 0   No facility-administered medications prior to visit.    Allergies  Allergen Reactions  . Flomax [Tamsulosin Hcl] Other (See Comments)    Dizziness  . Gabapentin Other (See Comments)    Unsteadiness and Balance problems.    . Sulfa Antibiotics   . Other Other (See Comments)    Topical sulfa only. Gets skin irritation.    ROS Review of Systems    Objective:    Physical Exam  Constitutional: He appears well-developed and well-nourished.  HENT:  Head: Normocephalic.  Skin:  He has 2 lesions on his left arm one on his right upper arm over the bicep area and several lesions on his upper chest.  Most of them are consistent with being seborrheic keratosis anywhere from a pinkish color to dark brown color.  The one on his right upper bicep is most concerning.  It actually does have a somewhat shiny appearance but he says that it was more crusty and flaky until the top layer came off.  We did discuss the importance of returning for a shave biopsy if that lesion returns after cryotherapy.  Also the one on his left upper chest has some dried crusted blood so I am not sure if it was scratched or irritated.    BP 132/63   Pulse 61   Ht 6\' 3"  (1.905 m)   Wt 179 lb (81.2 kg)   SpO2 100%   BMI 22.37 kg/m  Wt Readings from Last 3 Encounters:  01/04/20 179 lb (81.2 kg)  12/26/19 184 lb (83.5 kg)  11/28/19 183 lb 8 oz (83.2 kg)     Health Maintenance Due  Topic Date Due  . TETANUS/TDAP  10/12/2017    There are no preventive care reminders to display for this  patient.  Lab Results  Component Value Date   TSH 1.89 02/06/2016   Lab Results  Component Value Date   WBC 4.9 11/28/2019   HGB 13.3  11/28/2019   HCT 40.3 11/28/2019   MCV 93.5 11/28/2019   PLT 149 (L) 11/28/2019   Lab Results  Component Value Date   NA 143 08/28/2019   K 4.4 08/28/2019   CO2 28 08/28/2019   GLUCOSE 103 (H) 08/28/2019   BUN 27 (H) 08/28/2019   CREATININE 1.34 (H) 08/28/2019   BILITOT 0.5 08/28/2019   ALKPHOS 75 08/28/2019   AST 22 08/28/2019   ALT 20 08/28/2019   PROT 7.2 08/28/2019   ALBUMIN 4.5 08/28/2019   CALCIUM 9.2 08/28/2019   ANIONGAP 11 08/28/2019   Lab Results  Component Value Date   CHOL 168 02/09/2018   Lab Results  Component Value Date   HDL 42 02/09/2018   Lab Results  Component Value Date   LDLCALC 103 (H) 02/09/2018   Lab Results  Component Value Date   TRIG 131 02/09/2018   Lab Results  Component Value Date   CHOLHDL 4.0 02/09/2018   Lab Results  Component Value Date   HGBA1C 5.8 02/02/2017      Assessment & Plan:   Problem List Items Addressed This Visit      Genitourinary   CKD (chronic kidney disease) stage 3, GFR 30-59 ml/min    Due to recheck renal function.  Also recheck urine microalbumin level.  He did stop his NSAID which she had been taking for knee pain about a week ago.  Encouraged him to continue to avoid all NSAIDs.  We could try something like a topical Voltaren gel.       Relevant Orders   Urine Microalbumin w/creat. ratio    Other Visit Diagnoses    Arthritis of left knee    -  Primary   Need for tetanus, diphtheria, and acellular pertussis (Tdap) vaccine in patient of adolescent age or older       Relevant Medications   Tdap ([place]) 5-2.5-18.5 LF-MCG/0.5 injection   Medication management       Relevant Orders   COMPLETE METABOLIC PANEL WITH GFR   Inflamed seborrheic keratosis         Seborrheic keratoses-discussed treatment of the areas with cryotherapy.  He has several that just  look inflamed and some have a little bit of scabbing and crusting.  He says the one on his left forearm would tend to get irritated on his watch.  The one on his left upper chest he thinks he may have actually scratched at it by accident.  And then the one on his right upper bicep he says also had a crust on it initially but it seems to have peeled off slightly but it just seems to be irritated.  Left knee pain-we will get him scheduled with Dr. Dianah Field for an injection and possible Synvisc injections as well.  In the meantime I did recommend he avoid all oral NSAIDs we can try something like Voltaren gel.  Prescription given for Tdap vaccine to get that done at the pharmacy.  Cryotherapy Procedure Note  Pre-operative Diagnosis: Multiple seborrheic keratoses on the arms bilaterally and the upper chest.  Post-operative Diagnosis: same  Locations: upper chest and forearms  Indications: irritation    Procedure Details  Patient informed of risks (permanent scarring, infection, light or dark discoloration, bleeding, infection, weakness, numbness and recurrence of the lesion) and benefits of the procedure and verbal informed consent obtained. 11 lesions treated.   The areas are treated with liquid nitrogen therapy, frozen until ice ball extended 1-2 mm beyond lesion, allowed to thaw,  and treated again. The patient tolerated procedure well.  The patient was instructed on post-op care, warned that there may be blister formation, redness and pain. Recommend OTC analgesia as needed for pain.  Condition: Stable  Complications: none.  Plan: 1. Instructed to keep the area dry and covered for 24-48h and clean thereafter. 2. Warning signs of infection were reviewed.   3. Recommended that the patient use OTC acetaminophen as needed for pain.  4. Return as needed if any of the lesions recur then they will need to be biopsied.  I am most suspicious of the one on his right upper arm over the bicep  area and the one on his left upper chest near the clavicle.   Meds ordered this encounter  Medications  . Tdap (BOOSTRIX) 5-2.5-18.5 LF-MCG/0.5 injection    Sig: Inject 0.5 mLs into the muscle once for 1 dose.    Dispense:  0.5 mL    Refill:  0    Follow-up: Return in about 1 week (around 01/11/2020) for Dr. Darene Lamer for knee injection and possible.    Beatrice Lecher, MD

## 2020-01-05 LAB — COMPLETE METABOLIC PANEL WITHOUT GFR
AG Ratio: 1.7 (calc) (ref 1.0–2.5)
ALT: 11 U/L (ref 9–46)
AST: 16 U/L (ref 10–35)
Albumin: 4.5 g/dL (ref 3.6–5.1)
Alkaline phosphatase (APISO): 83 U/L (ref 35–144)
BUN/Creatinine Ratio: 18 (calc) (ref 6–22)
BUN: 23 mg/dL (ref 7–25)
CO2: 30 mmol/L (ref 20–32)
Calcium: 9.9 mg/dL (ref 8.6–10.3)
Chloride: 103 mmol/L (ref 98–110)
Creat: 1.3 mg/dL — ABNORMAL HIGH (ref 0.70–1.11)
GFR, Est African American: 58 mL/min/1.73m2 — ABNORMAL LOW
GFR, Est Non African American: 50 mL/min/1.73m2 — ABNORMAL LOW
Globulin: 2.6 g/dL (ref 1.9–3.7)
Glucose, Bld: 111 mg/dL — ABNORMAL HIGH (ref 65–99)
Potassium: 4.8 mmol/L (ref 3.5–5.3)
Sodium: 141 mmol/L (ref 135–146)
Total Bilirubin: 0.6 mg/dL (ref 0.2–1.2)
Total Protein: 7.1 g/dL (ref 6.1–8.1)

## 2020-01-05 LAB — MICROALBUMIN / CREATININE URINE RATIO
Creatinine, Urine: 62 mg/dL (ref 20–320)
Microalb Creat Ratio: 5 mcg/mg creat (ref <30)
Microalb, Ur: 0.3 mg/dL

## 2020-01-11 ENCOUNTER — Other Ambulatory Visit: Payer: Self-pay

## 2020-01-11 ENCOUNTER — Ambulatory Visit (INDEPENDENT_AMBULATORY_CARE_PROVIDER_SITE_OTHER): Payer: Medicare Other | Admitting: Sports Medicine

## 2020-01-11 ENCOUNTER — Ambulatory Visit (INDEPENDENT_AMBULATORY_CARE_PROVIDER_SITE_OTHER): Payer: Medicare Other

## 2020-01-11 ENCOUNTER — Encounter: Payer: Self-pay | Admitting: Sports Medicine

## 2020-01-11 DIAGNOSIS — M170 Bilateral primary osteoarthritis of knee: Secondary | ICD-10-CM

## 2020-01-11 DIAGNOSIS — M25512 Pain in left shoulder: Secondary | ICD-10-CM | POA: Diagnosis not present

## 2020-01-11 DIAGNOSIS — M1711 Unilateral primary osteoarthritis, right knee: Secondary | ICD-10-CM | POA: Diagnosis not present

## 2020-01-11 DIAGNOSIS — M5417 Radiculopathy, lumbosacral region: Secondary | ICD-10-CM | POA: Diagnosis not present

## 2020-01-11 DIAGNOSIS — G8929 Other chronic pain: Secondary | ICD-10-CM

## 2020-01-11 DIAGNOSIS — M19012 Primary osteoarthritis, left shoulder: Secondary | ICD-10-CM | POA: Insufficient documentation

## 2020-01-11 DIAGNOSIS — M1712 Unilateral primary osteoarthritis, left knee: Secondary | ICD-10-CM | POA: Diagnosis not present

## 2020-01-11 MED ORDER — PREGABALIN 50 MG PO CAPS: 50.0000 mg | ORAL_CAPSULE | Freq: Two times a day (BID) | ORAL | 3 refills | Status: DC

## 2020-01-11 MED ORDER — DICLOFENAC SODIUM 2 % EX SOLN: 2.0000 | Freq: Two times a day (BID) | CUTANEOUS | 0 refills | Status: DC

## 2020-01-11 NOTE — Progress Notes (Addendum)
    Procedures performed today:    None.  Independent interpretation of notes and tests performed by another provider:   None.  Brief History, Exam, Impression, and Recommendations:    Primary osteoarthritis of both knees Justin Mcgee returns, he has done well with conservative treatment in the past for his knee. We were considering injection with recurrence of pain but he did just have his COVID-19 vaccine. His second vaccine will be in the middle of this month so we could potentially consider another steroid injection in the beginning of May of this year. In the meantime we are going to give him all of our samples of Pennsaid. Getting updated x-rays today. He declines any oral medication. I would like to see him back in about a month and we can do an injection if no better.  Chronic left shoulder pain Exam is consistent with glenohumeral osteoarthritis. Pain in the anterior and posterior joint lines. Present chronically, adding x-rays, rehab exercises, he can use the Pennsaid cream on this as well, return to see me in a month, will consider injection if no better.  Neuropathy, lumbosacral (radicular) Persistent peripheral neuropathic symptoms in the lower extremities, predominantly the feet. Did not respond to gabapentin. Adding Lyrica 50 mg twice daily, we can uptitrate monthly as needed.    ___________________________________________ Gwen Her. Dianah Field, M.D., ABFM., CAQSM. Primary Care and [place] Instructor of [place] of [facility] of Medicine

## 2020-01-11 NOTE — Assessment & Plan Note (Signed)
Justin Mcgee returns, he has done well with conservative treatment in the past for his knee. We were considering injection with recurrence of pain but he did just have his COVID-19 vaccine. His second vaccine will be in the middle of this month so we could potentially consider another steroid injection in the beginning of May of this year. In the meantime we are going to give him all of our samples of Pennsaid. Getting updated x-rays today. He declines any oral medication. I would like to see him back in about a month and we can do an injection if no better.

## 2020-01-11 NOTE — Assessment & Plan Note (Signed)
Persistent peripheral neuropathic symptoms in the lower extremities, predominantly the feet. Did not respond to gabapentin. Adding Lyrica 50 mg twice daily, we can uptitrate monthly as needed.

## 2020-01-11 NOTE — Assessment & Plan Note (Signed)
Exam is consistent with glenohumeral osteoarthritis. Pain in the anterior and posterior joint lines. Present chronically, adding x-rays, rehab exercises, he can use the Pennsaid cream on this as well, return to see me in a month, will consider injection if no better.

## 2020-01-11 NOTE — Addendum Note (Signed)
Addended by: Silverio Decamp on: 01/11/2020 11:47 AM   Modules accepted: Orders

## 2020-02-22 ENCOUNTER — Ambulatory Visit (INDEPENDENT_AMBULATORY_CARE_PROVIDER_SITE_OTHER): Payer: Medicare Other | Admitting: Sports Medicine

## 2020-02-22 ENCOUNTER — Other Ambulatory Visit: Payer: Self-pay

## 2020-02-22 DIAGNOSIS — M19012 Primary osteoarthritis, left shoulder: Secondary | ICD-10-CM | POA: Diagnosis not present

## 2020-02-22 DIAGNOSIS — M170 Bilateral primary osteoarthritis of knee: Secondary | ICD-10-CM

## 2020-02-22 NOTE — Progress Notes (Signed)
    Procedures performed today:    Procedure: Real-time Ultrasound Guided injection of the left glenohumeral joint Device: Samsung HS60  Verbal informed consent obtained.  Time-out conducted.  Noted no overlying erythema, induration, or other signs of local infection.  Skin prepped in a sterile fashion.  Local anesthesia: Topical Ethyl chloride.  With sterile technique and under real time ultrasound guidance: 1 cc Kenalog 40, 2 cc lidocaine, 2 cc bupivacaine injected easily Completed without difficulty  Pain immediately resolved suggesting accurate placement of the medication.  Advised to call if fevers/chills, erythema, induration, drainage, or persistent bleeding.  Images permanently stored and available for review in the ultrasound unit.  Impression: Technically successful ultrasound guided injection.  Procedure: Real-time Ultrasound Guided injection of the right knee Device: Samsung HS60  Verbal informed consent obtained.  Time-out conducted.  Noted no overlying erythema, induration, or other signs of local infection.  Skin prepped in a sterile fashion.  Local anesthesia: Topical Ethyl chloride.  With sterile technique and under real time ultrasound guidance: 1 cc Kenalog 40, 2 cc lidocaine, 2 cc bupivacaine injected easily Completed without difficulty  Pain immediately resolved suggesting accurate placement of the medication.  Advised to call if fevers/chills, erythema, induration, drainage, or persistent bleeding.  Images permanently stored and available for review in the ultrasound unit.  Impression: Technically successful ultrasound guided injection.  Procedure: Real-time Ultrasound Guided injection of the left knee Device: Samsung HS60  Verbal informed consent obtained.  Time-out conducted.  Noted no overlying erythema, induration, or other signs of local infection.  Skin prepped in a sterile fashion.  Local anesthesia: Topical Ethyl chloride.  With sterile technique  and under real time ultrasound guidance: 1 cc Kenalog 40, 2 cc lidocaine, 2 cc bupivacaine injected easily Completed without difficulty  Pain immediately resolved suggesting accurate placement of the medication.  Advised to call if fevers/chills, erythema, induration, drainage, or persistent bleeding.  Images permanently stored and available for review in the ultrasound unit.  Impression: Technically successful ultrasound guided injection.  Independent interpretation of notes and tests performed by another provider:   None.  Brief History, Exam, Impression, and Recommendations:    Primary osteoarthritis of left shoulder Kellar returns, his x-rays did confirm glenohumeral osteoarthritis. We deferred his injection at the last visit due to his COVID-19 vaccine series, today we injected his left glenohumeral joint, return in 1 month.  Primary osteoarthritis of both knees Diontre continues to have pain in his knees, we also deferred this injection due to his Covid series, today I injected both of his knees, return in 1 month.    ___________________________________________ Gwen Her. Dianah Field, M.D., ABFM., CAQSM. Primary Care and [place] Instructor of [place] of [facility] of Medicine

## 2020-02-22 NOTE — Assessment & Plan Note (Signed)
Justin Mcgee continues to have pain in his knees, we also deferred this injection due to his Covid series, today I injected both of his knees, return in 1 month.

## 2020-02-22 NOTE — Assessment & Plan Note (Signed)
Justin Mcgee returns, his x-rays did confirm glenohumeral osteoarthritis. We deferred his injection at the last visit due to his COVID-19 vaccine series, today we injected his left glenohumeral joint, return in 1 month.

## 2020-03-21 ENCOUNTER — Other Ambulatory Visit: Payer: Self-pay

## 2020-03-21 ENCOUNTER — Encounter: Payer: Self-pay | Admitting: Sports Medicine

## 2020-03-21 ENCOUNTER — Ambulatory Visit (INDEPENDENT_AMBULATORY_CARE_PROVIDER_SITE_OTHER): Payer: Medicare Other | Admitting: Sports Medicine

## 2020-03-21 DIAGNOSIS — M19012 Primary osteoarthritis, left shoulder: Secondary | ICD-10-CM | POA: Diagnosis not present

## 2020-03-21 DIAGNOSIS — M170 Bilateral primary osteoarthritis of knee: Secondary | ICD-10-CM | POA: Diagnosis not present

## 2020-03-21 NOTE — Assessment & Plan Note (Signed)
This is a very pleasant 85 year old male with glenohumeral osteoarthritis, at the last visit I injected his glenohumeral joint, he returns today completely pain-free with regards to his shoulder.

## 2020-03-21 NOTE — Assessment & Plan Note (Signed)
We also injected Justin Mcgee's knees at the last visit, right knee is doing well, left knee still has pain. At this point he feels like he can live with it, but understands that we could try viscosupplementation at the follow-up visit if he changes his mind.

## 2020-03-21 NOTE — Progress Notes (Signed)
    Procedures performed today:    None.  Independent interpretation of notes and tests performed by another provider:   None.  Brief History, Exam, Impression, and Recommendations:    Primary osteoarthritis of left shoulder This is a very pleasant 85 year old male with glenohumeral osteoarthritis, at the last visit I injected his glenohumeral joint, he returns today completely pain-free with regards to his shoulder.  Primary osteoarthritis of both knees We also injected Vick's knees at the last visit, right knee is doing well, left knee still has pain. At this point he feels like he can live with it, but understands that we could try viscosupplementation at the follow-up visit if he changes his mind.    ___________________________________________ Gwen Her. Dianah Field, M.D., ABFM., CAQSM. Primary Care and [place] Instructor of [place] of [facility] of Medicine

## 2020-05-03 ENCOUNTER — Encounter: Payer: Self-pay | Admitting: Sports Medicine

## 2020-05-03 ENCOUNTER — Other Ambulatory Visit: Payer: Self-pay

## 2020-05-03 ENCOUNTER — Ambulatory Visit (INDEPENDENT_AMBULATORY_CARE_PROVIDER_SITE_OTHER): Payer: Medicare Other | Admitting: Sports Medicine

## 2020-05-03 DIAGNOSIS — M5417 Radiculopathy, lumbosacral region: Secondary | ICD-10-CM | POA: Diagnosis not present

## 2020-05-03 DIAGNOSIS — M170 Bilateral primary osteoarthritis of knee: Secondary | ICD-10-CM

## 2020-05-03 NOTE — Assessment & Plan Note (Signed)
[place]ward also continues to have persistent neuropathic symptoms in both lower extremities, predominantly his feet, he did not respond to gabapentin, Lyrica at 50 mg twice daily has not helped, going up to 100 mg twice daily.

## 2020-05-03 NOTE — Progress Notes (Signed)
    Procedures performed today:    Procedure: Real-time Ultrasound Guided injection of the left knee Device: Samsung HS60  Verbal informed consent obtained.  Time-out conducted.  Noted no overlying erythema, induration, or other signs of local infection.  Skin prepped in a sterile fashion.  Local anesthesia: Topical Ethyl chloride.  With sterile technique and under real time ultrasound guidance:  30 mg/2 mL of OrthoVisc (sodium hyaluronate) in a prefilled syringe was injected easily into the knee through a 22-gauge needle.  Completed without difficulty  Pain immediately resolved suggesting accurate placement of the medication.  Advised to call if fevers/chills, erythema, induration, drainage, or persistent bleeding.  Images permanently stored and available for review in the ultrasound unit.  Impression: Technically successful ultrasound guided injection.  Procedure: Real-time Ultrasound Guided injection of the right knee Device: Samsung HS60  Verbal informed consent obtained.  Time-out conducted.  Noted no overlying erythema, induration, or other signs of local infection.  Skin prepped in a sterile fashion.  Local anesthesia: Topical Ethyl chloride.  With sterile technique and under real time ultrasound guidance:  30 mg/2 mL of OrthoVisc (sodium hyaluronate) in a prefilled syringe was injected easily into the knee through a 22-gauge needle.  Completed without difficulty  Pain immediately resolved suggesting accurate placement of the medication.  Advised to call if fevers/chills, erythema, induration, drainage, or persistent bleeding.  Images permanently stored and available for review in the ultrasound unit.  Impression: Technically successful ultrasound guided injection.  Independent interpretation of notes and tests performed by another provider:   None.  Brief History, Exam, Impression, and Recommendations:    Primary osteoarthritis of both knees Orthovisc injection #1 of 4  into both knees, return in 1 week for number 2 of 4.  Neuropathy, lumbosacral (radicular) [place] also continues to have persistent neuropathic symptoms in both lower extremities, predominantly his feet, he did not respond to gabapentin, Lyrica at 50 mg twice daily has not helped, going up to 100 mg twice daily.    ___________________________________________ Gwen Her. Dianah Field, M.D., ABFM., CAQSM. Primary Care and [place] Instructor of [place] of [facility] of Medicine

## 2020-05-03 NOTE — Assessment & Plan Note (Signed)
Orthovisc injection #1 of 4 into both knees, return in 1 week for number 2 of 4.

## 2020-05-05 ENCOUNTER — Other Ambulatory Visit: Payer: Self-pay

## 2020-05-05 ENCOUNTER — Emergency Department (INDEPENDENT_AMBULATORY_CARE_PROVIDER_SITE_OTHER)
Admission: EM | Admit: 2020-05-05 | Discharge: 2020-05-05 | Disposition: A | Discharge status: Home / Self Care | Payer: Medicare Other | Source: Home / Self Care

## 2020-05-05 ENCOUNTER — Encounter: Payer: Self-pay | Admitting: Emergency Medicine

## 2020-05-05 DIAGNOSIS — T63441A Toxic effect of venom of bees, accidental (unintentional), initial encounter: Secondary | ICD-10-CM | POA: Diagnosis not present

## 2020-05-05 MED ORDER — HYDROCORTISONE 1 % EX CREA
TOPICAL_CREAM | CUTANEOUS | 0 refills | Status: DC
Start: 2020-05-05 — End: 2021-01-02

## 2020-05-05 MED ORDER — FAMOTIDINE 20 MG PO TABS
20.0000 mg | ORAL_TABLET | Freq: Once | ORAL | Status: AC
  Administered 2020-05-05: 20 mg via ORAL

## 2020-05-05 MED ORDER — CETIRIZINE HCL 5 MG PO TABS: 5.0000 mg | ORAL_TABLET | Freq: Every day | ORAL | 0 refills | Status: DC

## 2020-05-05 MED ORDER — PREDNISONE 20 MG PO TABS
40.0000 mg | ORAL_TABLET | Freq: Once | ORAL | Status: AC
  Administered 2020-05-05: 40 mg via ORAL

## 2020-05-05 NOTE — ED Triage Notes (Signed)
Stung by yellow jackets 1 hour pta Swelling noted to R eyelid Stung on arms and face Denies SOB  No angioedema No OTC meds pta COVID vaccine  - pfizer April 2021

## 2020-05-05 NOTE — ED Provider Notes (Signed)
[name]    CSN: [id number] Arrival date & time: 05/05/20  1209      History   Chief Complaint Chief Complaint  Patient presents with  . Insect Bite  . Facial Swelling    HPI Justin Mcgee is a 85 y.o. male.   HPI Justin Mcgee is a 85 y.o. male presenting to UC with c/o Right upper eyelid pain, swelling, and itching that started 1 hour PTA after being stung by several yellow jackets while working in his garden.  Pain is burning 8/10. No medication taken PTA.  He also reports being stung on both forearms and his abdomen. He is unsure if the stinger is still in his eyelid.  No known allergies to bees. Denies oral swelling or difficulty breathing. Denies change in vision or discharge.     Past Medical History:  Diagnosis Date  . 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 Former smoker   . Fracture of right radius 1956  . Fracture, metacarpal 1956   1, 2, 3  . Fracture, tibia 1964  . 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 Marica Otter, no radiation or chemo, clear LN.     Patient Active Problem List   Diagnosis Date Noted  . Primary osteoarthritis of left shoulder 01/11/2020  . CKD (chronic kidney disease) stage 3, GFR 30-59 ml/min 01/04/2020  . Anemia 08/28/2019  . Mass of subcutaneous tissue of back 07/11/2019  . Cervical lymphadenopathy 07/11/2019  . Nummular eczematous dermatitis 05/16/2018  . Other viral warts 05/02/2018  . Paroxysmal atrial tachycardia by electrocardiography ([place]) 12/23/2016  . Unsteady gait 12/08/2016  . Uses hearing aid 08/06/2016  . Neuropathy, lumbosacral (radicular) 08/08/2015  . 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 Primary osteoarthritis of both knees 02/02/2014  . IFG (impaired fasting glucose) 04/19/2013  . GERD (gastroesophageal reflux disease) 03/22/2013  . History of squamous cell carcinoma of tongue 03/22/2013  . History of basal cell carcinoma of skin 03/22/2013  . BPH (benign  prostatic hyperplasia) 03/22/2013  . Former smoker   . Tinea corporis 05/24/2012    Past Surgical History:  Procedure Laterality Date  . APPENDECTOMY  1997  . CATARACT EXTRACTION    . CHOLECYSTECTOMY  05/16/10   in [place] while on vacation  . [place]  . INGUINAL HERNIA REPAIR Left 1994  . PARTIAL GLOSSECTOMY     with unilat neck dissection       Home Medications    Prior to Admission medications   Medication Sig Start Date End Date Taking? Authorizing Provider  Ascorbic Acid (VITAMIN C WITH ROSE HIPS) 500 MG tablet Take 500 mg by mouth daily.   Yes [provider]  Multiple Vitamin (MULTIVITAMIN WITH MINERALS) TABS tablet Take 1 tablet by mouth daily.   Yes [provider]  pantoprazole (PROTONIX) 40 MG tablet TAKE 1 TABLET TWICE A DAY 07/27/18  Yes Hali Marry, MD  pregabalin (LYRICA) 50 MG capsule Take 2 capsules (100 mg total) by mouth 2 (two) times daily. 05/03/20  Yes Silverio Decamp, MD  cetirizine (ZYRTEC) 5 MG tablet Take 1 tablet (5 mg total) by mouth daily. 05/05/20   Noe Gens, PA-C  Diclofenac Sodium 2 % SOLN Place 2 sprays onto the skin 2 (two) times daily. 01/11/20   Silverio Decamp, MD  hydrocortisone cream 1 % Apply to affected area 2 times daily 05/05/20  Noe Gens, PA-C    Family History Family History  Problem Relation Age of Onset  . GER disease Father   . Other Father        [place]  . Diabetes Father   . Colon cancer Mother   . Diabetes Other     Social History Social History   Tobacco Use  . Smoking status: Former Smoker    Quit date: 02/19/1978    Years since quitting: 42.2  . Smokeless tobacco: Never Used  Vaping Use  . Vaping Use: Never used  Substance Use Topics  . Alcohol use: No    Alcohol/week: 0.0 standard drinks  . Drug use: No     Allergies   Flomax [tamsulosin hcl], Gabapentin, Sulfa antibiotics, and Other   Review of Systems Review  of Systems  Respiratory: Negative for shortness of breath and wheezing.   Skin: Positive for color change.     Physical Exam Triage Vital Signs ED Triage Vitals  Enc Vitals Group     BP 05/05/20 1229 (!) 136/73     Pulse Rate 05/05/20 1229 67     Resp 05/05/20 1229 15     Temp 05/05/20 1229 (!) 97.5 F (36.4 C)     Temp Source 05/05/20 1229 Oral     SpO2 05/05/20 1229 99 %     Weight --      Height --      Head Circumference --      Peak Flow --      Pain Score 05/05/20 1234 8     Pain Loc --      Pain Edu? --      Excl. in [place]? --    No data found.  Updated Vital Signs BP (!) 136/73 (BP Location: Left Arm)   Pulse 67   Temp (!) 97.5 F (36.4 C) (Oral)   Resp 15   SpO2 99%   Visual Acuity Right Eye Distance:   Left Eye Distance:   Bilateral Distance:    Right Eye Near:   Left Eye Near:    Bilateral Near:     Physical Exam Vitals and nursing note reviewed.  Constitutional:      General: He is not in acute distress.    Appearance: Normal appearance. He is well-developed.  HENT:     Head: Normocephalic and atraumatic.     Nose: Nose normal.     Mouth/Throat:     Lips: Pink.     Mouth: Mucous membranes are moist.     Pharynx: Oropharynx is clear. Uvula midline.  Eyes:     Extraocular Movements: Extraocular movements intact.     Conjunctiva/sclera: Conjunctivae normal.     Pupils: Pupils are equal, round, and reactive to light.   Cardiovascular:     Rate and Rhythm: Normal rate.  Pulmonary:     Effort: Pulmonary effort is normal.  Musculoskeletal:        General: Normal range of motion.     Cervical back: Normal range of motion.  Skin:    General: Skin is warm and dry.     Findings: Erythema present.          Comments: Left forearm: 2cm area of erythema, mild tenderness. No edema or induration. No foreign body visualized. Right forearm: 1cm area erythema, centralized attached stinger sack. Mild tenderness. Right lower abdomen: 32mm erythematous  flat lesion. Non-tender. No foreign bodies.   Neurological:     Mental Status: He is alert  and oriented to person, place, and time.  Psychiatric:        Behavior: Behavior normal.      UC Treatments / Results  Labs (all labs ordered are listed, but only abnormal results are displayed) Labs Reviewed - No data to display  EKG   Radiology No results found.  Procedures Foreign Body Removal  Date/Time: 05/05/2020 2:47 PM Performed by: Noe Gens, PA-C Authorized by: Noe Gens, PA-C   Consent:    Consent obtained:  Verbal   Consent given by:  Patient   Risks discussed:  Bleeding, infection, nerve damage, pain, worsening of condition and incomplete removal Location:    Location:  Face   Face location:  R eyelid   Depth:  Subcutaneous Pre-procedure details:    Imaging:  None Anesthesia (see MAR for exact dosages):    Anesthesia method:  None Procedure type:    Procedure complexity:  Simple Procedure details:    Localization method:  Visualized   Removal mechanism:  Hemostat   Foreign bodies recovered:  1   Description:  Stinger with sack   Intact foreign body removal: yes   Post-procedure details:    Confirmation:  No additional foreign bodies on visualization   Skin closure:  None   Patient tolerance of procedure:  Tolerated well, no immediate complications Foreign Body Removal  Date/Time: 05/05/2020 2:48 PM Performed by: Noe Gens, PA-C Authorized by: Noe Gens, PA-C   Consent:    Consent obtained:  Verbal   Consent given by:  Patient   Risks discussed:  Bleeding, infection, nerve damage, pain, incomplete removal and worsening of condition   Alternatives discussed:  No treatment Location:    Location:  Arm   Arm location:  R forearm   Depth:  Subcutaneous Pre-procedure details:    Imaging:  None Anesthesia (see MAR for exact dosages):    Anesthesia method:  None Procedure type:    Procedure complexity:  Simple Procedure details:     Localization method:  Visualized   Removal mechanism:  Hemostat   Foreign bodies recovered:  1   Description:  Stinger with sack   Intact foreign body removal: yes   Post-procedure details:    Confirmation:  No additional foreign bodies on visualization   Skin closure:  None   Patient tolerance of procedure:  Tolerated well, no immediate complications   (including critical care time)  Medications Ordered in UC Medications  predniSONE (DELTASONE) tablet 40 mg (40 mg Oral Given 05/05/20 1308)  famotidine (PEPCID) tablet 20 mg (20 mg Oral Given 05/05/20 1309)    Initial Impression / Assessment and Plan / UC Course  I have reviewed the triage vital signs and the nursing notes.  Pertinent labs & imaging results that were available during my care of the patient were reviewed by me and considered in my medical decision making (see chart for details).     Two stingers with sacks attached removed as noted above Pt given prednisone and pepcid in UC No evidence of anaphylaxis Encouraged use of antihistamines, hydrocortisone cream, cool compresses, and may take Tylenol and/or motrin for pain F/u with PCP as needed Discussed symptoms that warrant emergent care in the ED. AVS provided.  Final Clinical Impressions(s) / UC Diagnoses   Final diagnoses:  Bee sting reaction, accidental or unintentional, initial encounter     Discharge Instructions      You may take the cetirizine as prescribed to help with inflammation and itching as well  as use the hydrocortisone cream.  Be cautious in the sun if you apply the cream to your face, you are more likely to get sun burned.  Follow up with your primary care provider in 2-3 days if not improving, you may return to urgent care sooner if needed.  Call 911 or have someone drive you to the hospital if you develop swelling of your lips, tongue, or throat, severe swelling around your eye, difficulty breathing, or other new concerning symptoms  develop.     ED Prescriptions    Medication Sig Dispense Auth. Provider   cetirizine (ZYRTEC) 5 MG tablet Take 1 tablet (5 mg total) by mouth daily. 7 tablet Leeroy Cha O, PA-C   hydrocortisone cream 1 % Apply to affected area 2 times daily 15 g Noe Gens, [place]     PDMP not reviewed this encounter.   Noe Gens, [place] 05/05/20 1450

## 2020-05-05 NOTE — Discharge Instructions (Signed)
  You may take the cetirizine as prescribed to help with inflammation and itching as well as use the hydrocortisone cream.  Be cautious in the sun if you apply the cream to your face, you are more likely to get sun burned.  Follow up with your primary care provider in 2-3 days if not improving, you may return to urgent care sooner if needed.  Call 911 or have someone drive you to the hospital if you develop swelling of your lips, tongue, or throat, severe swelling around your eye, difficulty breathing, or other new concerning symptoms develop.

## 2020-05-10 ENCOUNTER — Ambulatory Visit (INDEPENDENT_AMBULATORY_CARE_PROVIDER_SITE_OTHER): Payer: Medicare Other | Admitting: Sports Medicine

## 2020-05-10 ENCOUNTER — Other Ambulatory Visit: Payer: Self-pay

## 2020-05-10 DIAGNOSIS — M170 Bilateral primary osteoarthritis of knee: Secondary | ICD-10-CM | POA: Diagnosis not present

## 2020-05-10 NOTE — Assessment & Plan Note (Signed)
Orthovisc No. 2 of 4 into both knees, return in 1 week for #3 of 4.

## 2020-05-10 NOTE — Progress Notes (Signed)
° ° °  Procedures performed today:    Procedure: Real-time Ultrasound Guidedinjection of the left knee Device: Samsung HS60  Verbal informed consent obtained.  Time-out conducted.  Noted no overlying erythema, induration, or other signs of local infection.  Skin prepped in a sterile fashion.  Local anesthesia: Topical Ethyl chloride.  With sterile technique and under real time ultrasound guidance: 30 mg/2 mL of OrthoVisc (sodium hyaluronate) in a prefilled syringe was injected easily into the knee through a 22-gauge needle.  Completed without difficulty  Pain immediately resolved suggesting accurate placement of the medication.  Advised to call if fevers/chills, erythema, induration, drainage, or persistent bleeding.  Images permanently stored and available for review in the ultrasound unit.  Impression: Technically successful ultrasound guided injection.  Procedure: Real-time Ultrasound Guidedinjection of the right knee Device: Samsung HS60  Verbal informed consent obtained.  Time-out conducted.  Noted no overlying erythema, induration, or other signs of local infection.  Skin prepped in a sterile fashion.  Local anesthesia: Topical Ethyl chloride.  With sterile technique and under real time ultrasound guidance: 30 mg/2 mL of OrthoVisc (sodium hyaluronate) in a prefilled syringe was injected easily into the knee through a 22-gauge needle.  Completed without difficulty  Pain immediately resolved suggesting accurate placement of the medication.  Advised to call if fevers/chills, erythema, induration, drainage, or persistent bleeding.  Images permanently stored and available for review in the ultrasound unit.  Impression: Technically successful ultrasound guided injection.  Independent interpretation of notes and tests performed by another provider:   None.  Brief History, Exam, Impression, and Recommendations:    Primary osteoarthritis of both knees Orthovisc No. 2 of 4 into  both knees, return in 1 week for #3 of 4.    ___________________________________________ Gwen Her. Dianah Field, M.D., ABFM., CAQSM. Primary Care and [place] Instructor of [place] of [facility] of Medicine

## 2020-05-16 ENCOUNTER — Ambulatory Visit (INDEPENDENT_AMBULATORY_CARE_PROVIDER_SITE_OTHER): Payer: Medicare Other | Admitting: Sports Medicine

## 2020-05-16 ENCOUNTER — Other Ambulatory Visit: Payer: Self-pay

## 2020-05-16 DIAGNOSIS — M170 Bilateral primary osteoarthritis of knee: Secondary | ICD-10-CM | POA: Diagnosis not present

## 2020-05-16 NOTE — Assessment & Plan Note (Signed)
Orthovisc injection #3 of 4 into both knees, return in 1 week for #4 of 4, starting to feel an improvement.

## 2020-05-16 NOTE — Progress Notes (Signed)
    Procedures performed today:    Procedure: Real-time Ultrasound Guidedinjection of theleft knee Device: Samsung HS60  Verbal informed consent obtained.  Time-out conducted.  Noted no overlying erythema, induration, or other signs of local infection.  Skin prepped in a sterile fashion.  Local anesthesia: Topical Ethyl chloride.  With sterile technique and under real time ultrasound guidance:30 mg/2 mL of OrthoVisc (sodium hyaluronate) in a prefilled syringe was injected easily into the knee through a 22-gauge needle. Completed without difficulty  Pain immediately resolved suggesting accurate placement of the medication.  Advised to call if fevers/chills, erythema, induration, drainage, or persistent bleeding.  Images permanently stored and available for review in the ultrasound unit.  Impression: Technically successful ultrasound guided injection.  Procedure: Real-time Ultrasound Guidedinjection of theright knee Device: Samsung HS60  Verbal informed consent obtained.  Time-out conducted.  Noted no overlying erythema, induration, or other signs of local infection.  Skin prepped in a sterile fashion.  Local anesthesia: Topical Ethyl chloride.  With sterile technique and under real time ultrasound guidance:30 mg/2 mL of OrthoVisc (sodium hyaluronate) in a prefilled syringe was injected easily into the knee through a 22-gauge needle. Completed without difficulty  Pain immediately resolved suggesting accurate placement of the medication.  Advised to call if fevers/chills, erythema, induration, drainage, or persistent bleeding.  Images permanently stored and available for review in the ultrasound unit.  Impression: Technically successful ultrasound guided injection.  Independent interpretation of notes and tests performed by another provider:   None.  Brief History, Exam, Impression, and Recommendations:    Primary osteoarthritis of both knees Orthovisc injection #3 of 4  into both knees, return in 1 week for #4 of 4, starting to feel an improvement.    ___________________________________________ Gwen Her. Dianah Field, M.D., ABFM., CAQSM. Primary Care and [place] Instructor of [place] of [facility] of Medicine

## 2020-05-17 ENCOUNTER — Ambulatory Visit: Payer: Medicare Other | Admitting: Sports Medicine

## 2020-05-23 ENCOUNTER — Other Ambulatory Visit: Payer: Self-pay

## 2020-05-23 ENCOUNTER — Ambulatory Visit (INDEPENDENT_AMBULATORY_CARE_PROVIDER_SITE_OTHER): Payer: Medicare Other | Admitting: Sports Medicine

## 2020-05-23 DIAGNOSIS — M170 Bilateral primary osteoarthritis of knee: Secondary | ICD-10-CM

## 2020-05-23 MED ORDER — CELECOXIB 100 MG PO CAPS: 100.0000 mg | ORAL_CAPSULE | Freq: Every day | ORAL | 3 refills | Status: DC

## 2020-05-23 NOTE — Assessment & Plan Note (Signed)
Aspiration bilateral Orthovisc No. 4 of 4. Felt a lot better. He does have mild chronic kidney disease, I am going to start a very low-dose of Celebrex, he will hydrate aggressively, I would like to see him back in a month at which point we would probably recheck his creatinine.

## 2020-05-23 NOTE — Progress Notes (Signed)
    Procedures performed today:    Procedure: Real-time Ultrasound Guidedinjection of theleft knee Device: Samsung HS60  Verbal informed consent obtained.  Time-out conducted.  Noted no overlying erythema, induration, or other signs of local infection.  Skin prepped in a sterile fashion.  Local anesthesia: Topical Ethyl chloride.  With sterile technique and under real time ultrasound guidance:Aspirated 40 cc straw-colored fluid, syringe switched and 30 mg/2 mL of OrthoVisc (sodium hyaluronate) in a prefilled syringe was injected easily into the knee. Completed without difficulty  Pain immediately resolved suggesting accurate placement of the medication.  Advised to call if fevers/chills, erythema, induration, drainage, or persistent bleeding.  Images permanently stored and available for review in the ultrasound unit.  Impression: Technically successful ultrasound guided injection.  Procedure: Real-time Ultrasound Guidedinjection of theright knee Device: Samsung HS60  Verbal informed consent obtained.  Time-out conducted.  Noted no overlying erythema, induration, or other signs of local infection.  Skin prepped in a sterile fashion.  Local anesthesia: Topical Ethyl chloride.  With sterile technique and under real time ultrasound guidance:Aspirated 30 cc of clear, straw-colored fluid, syringe switched and 30 mg/2 mL of OrthoVisc (sodium hyaluronate) in a prefilled syringe was injected easily into the knee.  Completed without difficulty  Pain immediately resolved suggesting accurate placement of the medication.  Advised to call if fevers/chills, erythema, induration, drainage, or persistent bleeding.  Images permanently stored and available for review in the ultrasound unit.  Impression: Technically successful ultrasound guided injection.  Independent interpretation of notes and tests performed by another provider:   None.  Brief History, Exam, Impression, and  Recommendations:    Primary osteoarthritis of both knees Aspiration bilateral Orthovisc No. 4 of 4. Felt a lot better. He does have mild chronic kidney disease, I am going to start a very low-dose of Celebrex, he will hydrate aggressively, I would like to see him back in a month at which point we would probably recheck his creatinine.    ___________________________________________ Gwen Her. Dianah Field, M.D., ABFM., CAQSM. Primary Care and [place] Instructor of [place] of [facility] of Medicine

## 2020-06-04 ENCOUNTER — Other Ambulatory Visit: Payer: Self-pay | Admitting: *Deleted

## 2020-06-04 DIAGNOSIS — M5417 Radiculopathy, lumbosacral region: Secondary | ICD-10-CM

## 2020-06-04 DIAGNOSIS — M170 Bilateral primary osteoarthritis of knee: Secondary | ICD-10-CM

## 2020-06-04 MED ORDER — CELECOXIB 100 MG PO CAPS: 200.0000 mg | ORAL_CAPSULE | Freq: Every day | ORAL | 1 refills | Status: DC

## 2020-06-04 MED ORDER — PREGABALIN 100 MG PO CAPS: 100.0000 mg | ORAL_CAPSULE | Freq: Two times a day (BID) | ORAL | 1 refills | Status: DC

## 2020-06-21 ENCOUNTER — Other Ambulatory Visit: Payer: Self-pay | Admitting: *Deleted

## 2020-06-21 MED ORDER — PANTOPRAZOLE SODIUM 40 MG PO TBEC: 40.0000 mg | DELAYED_RELEASE_TABLET | Freq: Two times a day (BID) | ORAL | 4 refills | Status: DC

## 2020-06-26 ENCOUNTER — Ambulatory Visit (INDEPENDENT_AMBULATORY_CARE_PROVIDER_SITE_OTHER): Payer: Medicare Other | Admitting: Sports Medicine

## 2020-06-26 ENCOUNTER — Other Ambulatory Visit: Payer: Self-pay

## 2020-06-26 DIAGNOSIS — M170 Bilateral primary osteoarthritis of knee: Secondary | ICD-10-CM

## 2020-06-26 DIAGNOSIS — Z23 Encounter for immunization: Secondary | ICD-10-CM | POA: Diagnosis not present

## 2020-06-26 NOTE — Progress Notes (Signed)
    Procedures performed today:    None.  Independent interpretation of notes and tests performed by another provider:   None.  Brief History, Exam, Impression, and Recommendations:    Primary osteoarthritis of both knees Justin Mcgee returns, he is a pleasant 85 year old male, he has finished Orthovisc injections in both knees, we also added low-dose Celebrex. He is doing much better, for the most part does not have any pain, he can do what he wants to do. We will recheck his creatinine today due to the Celebrex, and I am happy to go up on the dose if his creatinine continues to look normal. Otherwise he can return to see me on an as-needed basis.    ___________________________________________ Gwen Her. Dianah Field, M.D., ABFM., CAQSM. Primary Care and [place] Instructor of [place] of [facility] of Medicine

## 2020-06-26 NOTE — Assessment & Plan Note (Signed)
Justin Mcgee returns, he is a pleasant 85 year old male, he has finished Orthovisc injections in both knees, we also added low-dose Celebrex. He is doing much better, for the most part does not have any pain, he can do what he wants to do. We will recheck his creatinine today due to the Celebrex, and I am happy to go up on the dose if his creatinine continues to look normal. Otherwise he can return to see me on an as-needed basis.

## 2020-06-27 LAB — BASIC METABOLIC PANEL WITH GFR
BUN/Creatinine Ratio: 18 (calc) (ref 6–22)
BUN: 23 mg/dL (ref 7–25)
CO2: 31 mmol/L (ref 20–32)
Calcium: 9.4 mg/dL (ref 8.6–10.3)
Chloride: 103 mmol/L (ref 98–110)
Creat: 1.31 mg/dL — ABNORMAL HIGH (ref 0.70–1.11)
GFR, Est African American: 58 mL/min/{1.73_m2} — ABNORMAL LOW (ref >=60)
GFR, Est Non African American: 50 mL/min/{1.73_m2} — ABNORMAL LOW (ref >=60)
Glucose, Bld: 110 mg/dL (ref 65–139)
Potassium: 5.1 mmol/L (ref 3.5–5.3)
Sodium: 140 mmol/L (ref 135–146)

## 2020-07-24 ENCOUNTER — Other Ambulatory Visit: Payer: Self-pay

## 2020-07-24 ENCOUNTER — Ambulatory Visit (INDEPENDENT_AMBULATORY_CARE_PROVIDER_SITE_OTHER): Payer: Medicare Other | Admitting: Family Medicine

## 2020-07-24 ENCOUNTER — Encounter: Payer: Self-pay | Admitting: Family Medicine

## 2020-07-24 DIAGNOSIS — B078 Other viral warts: Secondary | ICD-10-CM | POA: Diagnosis not present

## 2020-07-24 MED ORDER — MUPIROCIN 2 % EX OINT: 1.0000 "application " | TOPICAL_OINTMENT | Freq: Two times a day (BID) | CUTANEOUS | 0 refills | Status: DC

## 2020-07-24 NOTE — Progress Notes (Signed)
Justin Mcgee - 85 y.o. male MRN [id number]  Date of birth: 09-18-1935  Subjective Chief Complaint  Patient presents with  . Blister    Right thumb    HPI Justin Mcgee is a 85 y.o. male here today with complaint of blister/skin lesion to R thumb. He reports this is a recurrent issue.  He often will have a blister that pops up and he will squeeze and get fluid out.  This time he had area appear but hasn't had fluid.  He has mild pain.  There has not been drainage or warmth around area.   ROS:  A comprehensive ROS was completed and negative except as noted per HPI  Allergies  Allergen Reactions  . Flomax [Tamsulosin Hcl] Other (See Comments)    Dizziness  . Gabapentin Other (See Comments)    Unsteadiness and Balance problems.    . Sulfa Antibiotics   . Other Other (See Comments)    Topical sulfa only. Gets skin irritation.    Past Medical History:  Diagnosis Date  . 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 Former smoker   . Fracture of right radius 1956  . Fracture, metacarpal 1956   1, 2, 3  . Fracture, tibia 1964  . 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 Marica Otter, no radiation or chemo, clear LN.     Past Surgical History:  Procedure Laterality Date  . APPENDECTOMY  1997  . CATARACT EXTRACTION    . CHOLECYSTECTOMY  05/16/10   in [place] while on vacation  . [place]  . INGUINAL HERNIA REPAIR Left 1994  . PARTIAL GLOSSECTOMY     with unilat neck dissection    Social History   Socioeconomic History  . Marital status: Married    Spouse name: Krew Hortman   . Number of children: 2  . Years of education: 80  . Highest education level: Some college, no degree  Occupational History  . Occupation: Community education officer    Employer: [employer]  Tobacco Use  . Smoking status: Former Smoker    Quit date: 02/19/1978    Years since quitting: 42.4  . Smokeless tobacco: Never Used  Vaping Use  .  Vaping Use: Never used  Substance and Sexual Activity  . Alcohol use: No    Alcohol/week: 0.0 standard drinks  . Drug use: No  . Sexual activity: Yes    Partners: Female  Other Topics Concern  . Not on file  Social History Narrative   4 caffeine drink per day.  40 min cardio/strength 6 days per week. Retired from [employer].             Social Determinants of Health   Financial Resource Strain:   . Difficulty of Paying Living Expenses: Not on file  Food Insecurity:   . Worried About Charity fundraiser in the Last Year: Not on file  . Ran Out of Food in the Last Year: Not on file  Transportation Needs:   . Lack of Transportation (Medical): Not on file  . Lack of Transportation (Non-Medical): Not on file  Physical Activity:   . Days of Exercise per Week: Not on file  . Minutes of Exercise per Session: Not on file  Stress:   . Feeling of Stress : Not on file  Social Connections:   . Frequency of Communication with Friends and Family: Not on  file  . Frequency of Social Gatherings with Friends and Family: Not on file  . Attends Religious Services: Not on file  . Active Member of Clubs or Organizations: Not on file  . Attends Archivist Meetings: Not on file  . Marital Status: Not on file    Family History  Problem Relation Age of Onset  . GER disease Father   . Other Father        Gilliam-Barre  . Diabetes Father   . Colon cancer Mother   . Diabetes Other     Health Maintenance  Topic Date Due  . TETANUS/TDAP  10/12/2017  . INFLUENZA VACCINE  Completed  . COVID-19 Vaccine  Completed  . PNA vac Low Risk Adult  Completed     ----------------------------------------------------------------------------------------------------------------------------------------------------------------------------------------------------------------- Physical Exam BP 122/63 (BP Location: Left Arm, Patient Position: Sitting, Cuff Size: Normal)   Pulse (!) 58   Temp  97.8 F (36.6 C) (Oral)   Wt 180 lb 1.3 oz (81.7 kg)   BMI 22.51 kg/m   Physical Exam Constitutional:      Appearance: Normal appearance.  Skin:    Comments: Slightly raised, rough textured area along nail fold of R hand. There is some mild erythema and tenderness.  No abscess or fluid noted.    Neurological:     Mental Status: He is alert.     ------------------------------------------------------------------------------------------------------------------------------------------------------------------------------------------------------------------- Assessment and Plan  Other viral warts Area has appearance of irritated wart.  No evidence of paronychia.  Will have him try mupirocin to area.  DDX includes BCC or SCC.  Recommend follow up if not improving.     Meds ordered this encounter  Medications  . mupirocin ointment (BACTROBAN) 2 %    Sig: Apply 1 application topically 2 (two) times daily.    Dispense:  22 g    Refill:  0    No follow-ups on file.    This visit occurred during the SARS-CoV-2 public health emergency.  Safety protocols were in place, including screening questions prior to the visit, additional usage of staff PPE, and extensive cleaning of exam room while observing appropriate contact time as indicated for disinfecting solutions.

## 2020-07-24 NOTE — Assessment & Plan Note (Signed)
Area has appearance of irritated wart.  No evidence of paronychia.  Will have him try mupirocin to area.  DDX includes BCC or SCC.  Recommend follow up if not improving.

## 2020-09-09 ENCOUNTER — Other Ambulatory Visit: Payer: Self-pay

## 2020-09-09 ENCOUNTER — Emergency Department (INDEPENDENT_AMBULATORY_CARE_PROVIDER_SITE_OTHER)
Admission: EM | Admit: 2020-09-09 | Discharge: 2020-09-09 | Disposition: A | Discharge status: Home / Self Care | Payer: Medicare Other | Source: Home / Self Care

## 2020-09-09 DIAGNOSIS — L03011 Cellulitis of right finger: Secondary | ICD-10-CM | POA: Diagnosis not present

## 2020-09-09 MED ORDER — DOXYCYCLINE HYCLATE 100 MG PO CAPS: 100.0000 mg | ORAL_CAPSULE | Freq: Two times a day (BID) | ORAL | 0 refills | Status: AC

## 2020-09-09 NOTE — ED Triage Notes (Signed)
Rt thumb infected, denies injury. Saw Dr Zigmund Daniel in October and was given topical Mupirocin, not getting better.

## 2020-09-09 NOTE — Discharge Instructions (Signed)
  Call to schedule a follow up appointment with primary care or a dermatologist for further evaluation and treatment of your thumb.  You may need a skin biopsy of that area due to chronic (long term) symptoms.

## 2020-09-09 NOTE — ED Provider Notes (Signed)
[facility]    CSN: [id number] Arrival date & time: 09/09/20  1435      History   Chief Complaint Chief Complaint  Patient presents with  . thumb problem    HPI Justin Mcgee is a 85 y.o. male.   HPI  [place] Justin Mcgee is a 85 y.o. male presenting to UC with c/o Right thumb infection that was treated last month by his PCP with topical mupirocin. He reports intermittent skin sore in the same area over the last few years.  Area started to become more painful the last few days with redness.  No fevers.    Past Medical History:  Diagnosis Date  . 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 Former smoker   . Fracture of right radius 1956  . Fracture, metacarpal 1956   1, 2, 3  . Fracture, tibia 1964  . 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 Marica Otter, no radiation or chemo, clear LN.     Patient Active Problem List   Diagnosis Date Noted  . Primary osteoarthritis of left shoulder 01/11/2020  . CKD (chronic kidney disease) stage 3, GFR 30-59 ml/min (HCC) 01/04/2020  . Anemia 08/28/2019  . Mass of subcutaneous tissue of back 07/11/2019  . Cervical lymphadenopathy 07/11/2019  . Nummular eczematous dermatitis 05/16/2018  . Other viral warts 05/02/2018  . Paroxysmal atrial tachycardia by electrocardiography ([place]) 12/23/2016  . Unsteady gait 12/08/2016  . Uses hearing aid 08/06/2016  . Neuropathy, lumbosacral (radicular) 08/08/2015  . 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 Primary osteoarthritis of both knees 02/02/2014  . IFG (impaired fasting glucose) 04/19/2013  . GERD (gastroesophageal reflux disease) 03/22/2013  . History of squamous cell carcinoma of tongue 03/22/2013  . History of basal cell carcinoma of skin 03/22/2013  . BPH (benign prostatic hyperplasia) 03/22/2013  . Former smoker   . Tinea corporis 05/24/2012    Past Surgical History:  Procedure Laterality Date  . APPENDECTOMY  1997  . CATARACT EXTRACTION    .  CHOLECYSTECTOMY  05/16/10   in [place] while on vacation  . [place]  . INGUINAL HERNIA REPAIR Left 1994  . PARTIAL GLOSSECTOMY     with unilat neck dissection       Home Medications    Prior to Admission medications   Medication Sig Start Date End Date Taking? Authorizing Provider  Ascorbic Acid (VITAMIN C WITH ROSE HIPS) 500 MG tablet Take 500 mg by mouth daily.    [provider]  celecoxib (CELEBREX) 100 MG capsule Take 2 capsules (200 mg total) by mouth daily with breakfast. 06/04/20   Silverio Decamp, MD  cetirizine (ZYRTEC) 5 MG tablet Take 1 tablet (5 mg total) by mouth daily. 05/05/20   Noe Gens, PA-C  Diclofenac Sodium 2 % SOLN Place 2 sprays onto the skin 2 (two) times daily. 01/11/20   Silverio Decamp, MD  doxycycline (VIBRAMYCIN) 100 MG capsule Take 1 capsule (100 mg total) by mouth 2 (two) times daily for 7 days. 09/09/20 09/16/20  Noe Gens, PA-C  hydrocortisone cream 1 % Apply to affected area 2 times daily 05/05/20   Noe Gens, PA-C  Multiple Vitamin (MULTIVITAMIN WITH MINERALS) TABS tablet Take 1 tablet by mouth daily.    [provider]  mupirocin ointment (BACTROBAN) 2 % Apply 1 application topically 2 (two) times daily. 07/24/20   Luetta Nutting, DO  pantoprazole (PROTONIX) 40 MG tablet Take 1 tablet (40 mg total) by mouth 2 (two) times daily. 06/21/20   Hali Marry, MD  pregabalin (LYRICA) 100 MG capsule Take 1 capsule (100 mg total) by mouth 2 (two) times daily. 06/04/20   Silverio Decamp, MD    Family History Family History  Problem Relation Age of Onset  . GER disease Father   . Other Father        Gilliam-Barre  . Diabetes Father   . Colon cancer Mother   . Diabetes Other     Social History Social History   Tobacco Use  . Smoking status: Former Smoker    Quit date: 02/19/1978    Years since quitting: 42.5  . Smokeless tobacco: Never Used  Vaping Use  .  Vaping Use: Never used  Substance Use Topics  . Alcohol use: No    Alcohol/week: 0.0 standard drinks  . Drug use: No     Allergies   Flomax [tamsulosin hcl], Gabapentin, Sulfa antibiotics, and Other   Review of Systems Review of Systems  Musculoskeletal: Negative for arthralgias and joint swelling.  Skin: Positive for color change and wound.     Physical Exam Triage Vital Signs ED Triage Vitals  Enc Vitals Group     BP 09/09/20 1520 128/69     Pulse Rate 09/09/20 1520 (!) 54     Resp 09/09/20 1520 18     Temp 09/09/20 1520 97.9 F (36.6 C)     Temp Source 09/09/20 1520 Oral     SpO2 09/09/20 1520 99 %     Weight 09/09/20 1521 175 lb (79.4 kg)     Height 09/09/20 1521 6\' 3"  (1.905 m)     Head Circumference --      Peak Flow --      Pain Score 09/09/20 1521 3     Pain Loc --      Pain Edu? --      Excl. in [place]? --    No data found.  Updated Vital Signs BP 128/69 (BP Location: Right Arm)   Pulse (!) 54   Temp 97.9 F (36.6 C) (Oral)   Resp 18   Ht 6\' 3"  (1.905 m)   Wt 175 lb (79.4 kg)   SpO2 99%   BMI 21.87 kg/m   Visual Acuity Right Eye Distance:   Left Eye Distance:   Bilateral Distance:    Right Eye Near:   Left Eye Near:    Bilateral Near:     Physical Exam Vitals and nursing note reviewed.  Constitutional:      Appearance: Normal appearance. He is well-developed.  HENT:     Head: Normocephalic and atraumatic.  Cardiovascular:     Rate and Rhythm: Normal rate.  Pulmonary:     Effort: Pulmonary effort is normal.  Musculoskeletal:        General: Tenderness present. No swelling. Normal range of motion.     Cervical back: Normal range of motion.     Comments: Right thumb: full ROM, tenderness at base of nail (see skin exam)  Skin:    General: Skin is warm and dry.     Capillary Refill: Capillary refill takes less than 2 seconds.     Findings: Erythema present.     Comments: Right thumb: 69mm area erythema at based of nail, 86mm area of darker  erythematous skin lesion, tender. No active drainage.   Neurological:     Mental Status: He  is alert and oriented to person, place, and time.     Sensory: No sensory deficit.  Psychiatric:        Behavior: Behavior normal.      UC Treatments / Results  Labs (all labs ordered are listed, but only abnormal results are displayed) Labs Reviewed - No data to display  EKG   Radiology No results found.  Procedures Procedures (including critical care time)  Medications Ordered in UC Medications - No data to display  Initial Impression / Assessment and Plan / UC Course  I have reviewed the triage vital signs and the nursing notes.  Pertinent labs & imaging results that were available during my care of the patient were reviewed by me and considered in my medical decision making (see chart for details).     Will tx with oral antibiotics, encouraged soaking in epsom salt but also encouraged pt call to schedule f/u with a dermatologist for further evaluation and treatment to rule out skin cancer.  Resource guide provided.  Final Clinical Impressions(s) / UC Diagnoses   Final diagnoses:  Cellulitis of right thumb     Discharge Instructions      Call to schedule a follow up appointment with primary care or a dermatologist for further evaluation and treatment of your thumb.  You may need a skin biopsy of that area due to chronic (long term) symptoms.     ED Prescriptions    Medication Sig Dispense Auth. Provider   doxycycline (VIBRAMYCIN) 100 MG capsule Take 1 capsule (100 mg total) by mouth 2 (two) times daily for 7 days. 14 capsule Noe Gens, [place]     PDMP not reviewed this encounter.   Noe Gens, PA-C 09/09/20 2030

## 2020-09-16 DIAGNOSIS — L309 Dermatitis, unspecified: Secondary | ICD-10-CM | POA: Diagnosis not present

## 2020-09-16 DIAGNOSIS — L03011 Cellulitis of right finger: Secondary | ICD-10-CM | POA: Diagnosis not present

## 2020-09-18 DIAGNOSIS — H5211 Myopia, right eye: Secondary | ICD-10-CM | POA: Diagnosis not present

## 2020-09-18 DIAGNOSIS — H40013 Open angle with borderline findings, low risk, bilateral: Secondary | ICD-10-CM | POA: Diagnosis not present

## 2020-09-18 DIAGNOSIS — H5202 Hypermetropia, left eye: Secondary | ICD-10-CM | POA: Diagnosis not present

## 2020-09-24 DIAGNOSIS — L03011 Cellulitis of right finger: Secondary | ICD-10-CM | POA: Diagnosis not present

## 2020-10-17 DIAGNOSIS — B354 Tinea corporis: Secondary | ICD-10-CM | POA: Diagnosis not present

## 2020-10-17 DIAGNOSIS — L03011 Cellulitis of right finger: Secondary | ICD-10-CM | POA: Diagnosis not present

## 2020-11-25 ENCOUNTER — Other Ambulatory Visit: Payer: Self-pay | Admitting: Sports Medicine

## 2020-11-25 DIAGNOSIS — M170 Bilateral primary osteoarthritis of knee: Secondary | ICD-10-CM

## 2020-11-27 DIAGNOSIS — M67441 Ganglion, right hand: Secondary | ICD-10-CM | POA: Diagnosis not present

## 2020-12-06 ENCOUNTER — Ambulatory Visit (INDEPENDENT_AMBULATORY_CARE_PROVIDER_SITE_OTHER): Payer: Medicare Other | Admitting: Sports Medicine

## 2020-12-06 ENCOUNTER — Ambulatory Visit (INDEPENDENT_AMBULATORY_CARE_PROVIDER_SITE_OTHER): Payer: Medicare Other

## 2020-12-06 ENCOUNTER — Other Ambulatory Visit: Payer: Self-pay

## 2020-12-06 ENCOUNTER — Telehealth: Payer: Self-pay | Admitting: Family Medicine

## 2020-12-06 DIAGNOSIS — M19012 Primary osteoarthritis, left shoulder: Secondary | ICD-10-CM

## 2020-12-06 DIAGNOSIS — M67441 Ganglion, right hand: Secondary | ICD-10-CM | POA: Diagnosis not present

## 2020-12-06 DIAGNOSIS — M170 Bilateral primary osteoarthritis of knee: Secondary | ICD-10-CM | POA: Diagnosis not present

## 2020-12-06 NOTE — Assessment & Plan Note (Signed)
Romaine has had multiple steroid injections, Celebrex, activity modification, Orthovisc injections, unfortunately continues to have pain in his knee, referral to Dr. Berenice Primas for consideration of knee arthroplasty.

## 2020-12-06 NOTE — Assessment & Plan Note (Signed)
This pleasant 86 year old male has glenohumeral osteoarthritis, back in May we injected his glenohumeral joint and he did well, repeat glenohumeral joint injection today. Return as needed.

## 2020-12-06 NOTE — Progress Notes (Signed)
    Procedures performed today:    Procedure: Real-time Ultrasound Guided injection of the left glenohumeral joint Device: Samsung HS60  Verbal informed consent obtained.  Time-out conducted.  Noted no overlying erythema, induration, or other signs of local infection.  Skin prepped in a sterile fashion.  Local anesthesia: Topical Ethyl chloride.  With sterile technique and under real time ultrasound guidance:  Noted moderate osteoarthritis, 1 cc Kenalog 40, 2 cc lidocaine, 2 cc bupivacaine injected easily Completed without difficulty  Advised to call if fevers/chills, erythema, induration, drainage, or persistent bleeding.  Images permanently stored and available for review in PACS.  Impression: Technically successful ultrasound guided injection.  Independent interpretation of notes and tests performed by another provider:   None.  Brief History, Exam, Impression, and Recommendations:    Primary osteoarthritis of left shoulder This pleasant 86 year old male has glenohumeral osteoarthritis, back in May we injected his glenohumeral joint and he did well, repeat glenohumeral joint injection today. Return as needed.  Primary osteoarthritis of both knees Justin Mcgee has had multiple steroid injections, Celebrex, activity modification, Orthovisc injections, unfortunately continues to have pain in his knee, referral to Dr. Berenice Primas for consideration of knee arthroplasty.  Digital mucinous cyst of right thumb Has seen several providers including dermatology and urgent care, he does have a mass just proximal to the proximal nail fold of his right thumb, he tells me he can push on it in a thick viscous substance comes out, this is a classic mucinous cyst, these are benign and we can leave them alone, I have advised him to leave it alone however if he prefers excision I am happy to do that here in the office.    ___________________________________________ Gwen Her. Dianah Field, M.D., ABFM.,  CAQSM. Primary Care and [place] Instructor of [place] [facility]

## 2020-12-06 NOTE — Assessment & Plan Note (Signed)
Has seen several providers including dermatology and urgent care, he does have a mass just proximal to the proximal nail fold of his right thumb, he tells me he can push on it in a thick viscous substance comes out, this is a classic mucinous cyst, these are benign and we can leave them alone, I have advised him to leave it alone however if he prefers excision I am happy to do that here in the office.

## 2020-12-06 NOTE — Telephone Encounter (Signed)
Patient is asking to change his PCP to Dr. Luetta Nutting from [place]. He did not tell me a reason. I told patient I would ask both Drs and call him back and let him know what they said when I heard back. Thank you

## 2020-12-06 NOTE — Telephone Encounter (Signed)
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  No red flags.  I have not seen him in almost 2 years.

## 2020-12-09 NOTE — Telephone Encounter (Signed)
That would be fine with me as well.   Thanks!  CM

## 2021-01-02 ENCOUNTER — Encounter: Payer: Self-pay | Admitting: Family Medicine

## 2021-01-02 ENCOUNTER — Ambulatory Visit (INDEPENDENT_AMBULATORY_CARE_PROVIDER_SITE_OTHER): Payer: Medicare Other | Admitting: Family Medicine

## 2021-01-02 ENCOUNTER — Other Ambulatory Visit: Payer: Self-pay

## 2021-01-02 DIAGNOSIS — M67441 Ganglion, right hand: Secondary | ICD-10-CM

## 2021-01-02 DIAGNOSIS — N1831 Chronic kidney disease, stage 3a: Secondary | ICD-10-CM | POA: Diagnosis not present

## 2021-01-02 DIAGNOSIS — M170 Bilateral primary osteoarthritis of knee: Secondary | ICD-10-CM | POA: Diagnosis not present

## 2021-01-02 DIAGNOSIS — M5417 Radiculopathy, lumbosacral region: Secondary | ICD-10-CM

## 2021-01-02 MED ORDER — PREGABALIN 100 MG PO CAPS: 100.0000 mg | ORAL_CAPSULE | Freq: Two times a day (BID) | ORAL | 0 refills | Status: DC

## 2021-01-02 MED ORDER — PREGABALIN 100 MG PO CAPS: 100.0000 mg | ORAL_CAPSULE | Freq: Two times a day (BID) | ORAL | 2 refills | Status: DC

## 2021-01-02 NOTE — Assessment & Plan Note (Signed)
HE has been seeing Dr. Dianah Field for this.  Referral placed to Dr. Berenice Primas for evaluation of possible knee replacement.  He has not heard from this referral yet.

## 2021-01-02 NOTE — Assessment & Plan Note (Signed)
This continues to remain well managed with lyrica.  He'll continue this at current strength.  Refills ordered.

## 2021-01-02 NOTE — Patient Instructions (Signed)
Great to see you today! I have sent refills of lyrica in for you.  Please follow up in 6 months.

## 2021-01-02 NOTE — Progress Notes (Signed)
Justin Mcgee - 86 y.o. male MRN [id number]  Date of birth: 09/15/1935  Subjective Chief Complaint  Patient presents with  . Establish Care    HPI Justin Mcgee is a 86 y.o. male here today for initial visit with me.  He has a history of peripheral neuropathy, OA of several sites, BPH and CKDIII.   He has been seeing Dr. Dianah Field for arthritis.  Current treatment with celebrex daily.  He has also been referred to Dr. Berenice Primas due to persistent L knee pain despite viscosupplementation injections.    Neuropathy related to lumbar radiculopathy has been well controlled with lyrica.  Requesting refill of medication.  He had taken gabapentin previously but felt too sedated with this.     ROS:  A comprehensive ROS was completed and negative except as noted per HPI  Allergies  Allergen Reactions  . Flomax [Tamsulosin Hcl] Other (See Comments)    Dizziness  . Gabapentin Other (See Comments)    Unsteadiness and Balance problems.    . Sulfa Antibiotics   . Other Other (See Comments)    Topical sulfa only. Gets skin irritation.    Past Medical History:  Diagnosis Date  . 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 Former smoker   . Fracture of right radius 1956  . Fracture, metacarpal 1956   1, 2, 3  . Fracture, tibia 1964  . 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 Marica Otter, no radiation or chemo, clear LN.     Past Surgical History:  Procedure Laterality Date  . APPENDECTOMY  1997  . CATARACT EXTRACTION    . CHOLECYSTECTOMY  05/16/10   in [place] while on vacation  . [place]  . INGUINAL HERNIA REPAIR Left 1994  . PARTIAL GLOSSECTOMY     with unilat neck dissection    Social History   Socioeconomic History  . Marital status: Married    Spouse name: Powell Halbert   . Number of children: 2  . Years of education: 50  . Highest education level: Some college, no degree  Occupational History  . Occupation:  Community education officer    Employer: [employer]  Tobacco Use  . Smoking status: Former Smoker    Quit date: 02/19/1978    Years since quitting: 42.8  . Smokeless tobacco: Never Used  Vaping Use  . Vaping Use: Never used  Substance and Sexual Activity  . Alcohol use: No    Alcohol/week: 0.0 standard drinks  . Drug use: No  . Sexual activity: Yes    Partners: Female  Other Topics Concern  . Not on file  Social History Narrative   4 caffeine drink per day.  40 min cardio/strength 6 days per week. Retired from [employer].             Social Determinants of Health   Financial Resource Strain: Not on file  Food Insecurity: Not on file  Transportation Needs: Not on file  Physical Activity: Not on file  Stress: Not on file  Social Connections: Not on file    Family History  Problem Relation Age of Onset  . GER disease Father   . Other Father        Gilliam-Barre  . Diabetes Father   . Colon cancer Mother   . Diabetes Other     Health Maintenance  Topic Date Due  . TETANUS/TDAP  10/12/2017  .  COVID-19 Vaccine (3 - Pfizer risk 4-dose series) 02/27/2020  . INFLUENZA VACCINE  Completed  . PNA vac Low Risk Adult  Completed  . HPV VACCINES  Aged Out     ----------------------------------------------------------------------------------------------------------------------------------------------------------------------------------------------------------------- Physical Exam BP (!) 147/65 (BP Location: Left Arm, Patient Position: Sitting, Cuff Size: Normal)   Pulse 71   Temp (!) 97.4 F (36.3 C)   Ht 6\' 3"  (1.905 m)   Wt 182 lb 8 oz (82.8 kg)   SpO2 97%   BMI 22.81 kg/m   Physical Exam Constitutional:      Appearance: Normal appearance.  HENT:     Head: Normocephalic and atraumatic.  Cardiovascular:     Rate and Rhythm: Normal rate and regular rhythm.  Pulmonary:     Effort: Pulmonary effort is normal.     Breath sounds: Normal breath sounds.  Musculoskeletal:      Cervical back: Neck supple.  Neurological:     General: No focal deficit present.     Mental Status: He is alert.  Psychiatric:        Mood and Affect: Mood normal.        Behavior: Behavior normal.     ------------------------------------------------------------------------------------------------------------------------------------------------------------------------------------------------------------------- Assessment and Plan  Neuropathy, lumbosacral (radicular) This continues to remain well managed with lyrica.  He'll continue this at current strength.  Refills ordered.   Primary osteoarthritis of both knees HE has been seeing Dr. Dianah Field for this.  Referral placed to Dr. Berenice Primas for evaluation of possible knee replacement.  He has not heard from this referral yet.   CKD (chronic kidney disease) stage 3, GFR 30-59 ml/min He is currently on celebrex.  Renal function has remained stable with last check in 06/2020.  Will update renal function at next follow up.   Digital mucinous cyst of right thumb Has intermittent swelling, stable at this time.    Meds ordered this encounter  Medications  . DISCONTD: pregabalin (LYRICA) 100 MG capsule    Sig: Take 1 capsule (100 mg total) by mouth 2 (two) times daily.    Dispense:  180 capsule    Refill:  2  . pregabalin (LYRICA) 100 MG capsule    Sig: Take 1 capsule (100 mg total) by mouth 2 (two) times daily.    Dispense:  60 capsule    Refill:  0    Return in about 6 months (around 07/05/2021) for Neuropathy.    This visit occurred during the SARS-CoV-2 public health emergency.  Safety protocols were in place, including screening questions prior to the visit, additional usage of staff PPE, and extensive cleaning of exam room while observing appropriate contact time as indicated for disinfecting solutions.

## 2021-01-02 NOTE — Assessment & Plan Note (Signed)
He is currently on celebrex.  Renal function has remained stable with last check in 06/2020.  Will update renal function at next follow up.

## 2021-01-02 NOTE — Assessment & Plan Note (Signed)
Has intermittent swelling, stable at this time.

## 2021-01-14 DIAGNOSIS — M1712 Unilateral primary osteoarthritis, left knee: Secondary | ICD-10-CM | POA: Diagnosis not present

## 2021-01-14 DIAGNOSIS — M25562 Pain in left knee: Secondary | ICD-10-CM | POA: Diagnosis not present

## 2021-01-23 ENCOUNTER — Telehealth: Payer: Self-pay | Admitting: Medical Oncology

## 2021-01-23 NOTE — Telephone Encounter (Signed)
Oncology clearance form received for left  total knee arthroplasty /spinal anesthesia. Given to [place] .

## 2021-01-25 ENCOUNTER — Emergency Department (HOSPITAL_COMMUNITY)
Admission: EM | Admit: 2021-01-25 | Discharge: 2021-01-25 | Disposition: A | Discharge status: Home / Self Care | Payer: Medicare Other | Attending: Emergency Medicine | Admitting: Emergency Medicine

## 2021-01-25 ENCOUNTER — Encounter (HOSPITAL_COMMUNITY): Payer: Self-pay | Admitting: Emergency Medicine

## 2021-01-25 ENCOUNTER — Emergency Department (HOSPITAL_COMMUNITY): Payer: Medicare Other

## 2021-01-25 ENCOUNTER — Other Ambulatory Visit: Payer: Self-pay

## 2021-01-25 DIAGNOSIS — R0602 Shortness of breath: Secondary | ICD-10-CM | POA: Diagnosis not present

## 2021-01-25 DIAGNOSIS — N183 Chronic kidney disease, stage 3 unspecified: Secondary | ICD-10-CM | POA: Insufficient documentation

## 2021-01-25 DIAGNOSIS — U071 COVID-19: Secondary | ICD-10-CM | POA: Insufficient documentation

## 2021-01-25 DIAGNOSIS — R059 Cough, unspecified: Secondary | ICD-10-CM

## 2021-01-25 DIAGNOSIS — Z87891 Personal history of nicotine dependence: Secondary | ICD-10-CM | POA: Diagnosis not present

## 2021-01-25 DIAGNOSIS — Z85828 Personal history of other malignant neoplasm of skin: Secondary | ICD-10-CM | POA: Insufficient documentation

## 2021-01-25 DIAGNOSIS — J1282 Pneumonia due to coronavirus disease 2019: Secondary | ICD-10-CM | POA: Diagnosis not present

## 2021-01-25 DIAGNOSIS — Z28311 Partially vaccinated for covid-19: Secondary | ICD-10-CM | POA: Diagnosis not present

## 2021-01-25 DIAGNOSIS — J189 Pneumonia, unspecified organism: Secondary | ICD-10-CM | POA: Diagnosis not present

## 2021-01-25 LAB — RESP PANEL BY RT-PCR (FLU A&B, COVID) ARPGX2
Influenza A by PCR: NEGATIVE
Influenza B by PCR: NEGATIVE
SARS Coronavirus 2 by RT PCR: POSITIVE — AB

## 2021-01-25 MED ORDER — BENZONATATE 100 MG PO CAPS
100.0000 mg | ORAL_CAPSULE | Freq: Three times a day (TID) | ORAL | 0 refills | Status: DC
Start: 2021-01-25 — End: 2021-03-07

## 2021-01-25 MED ORDER — PAXLOVID 20 X 150 MG & 10 X 100MG PO TBPK: 1.0000 | ORAL_TABLET | Freq: Two times a day (BID) | ORAL | 0 refills | Status: DC

## 2021-01-25 MED ORDER — ONDANSETRON 4 MG PO TBDP
4.0000 mg | ORAL_TABLET | Freq: Three times a day (TID) | ORAL | 0 refills | Status: DC | PRN
Start: 2021-01-25 — End: 2021-03-07

## 2021-01-25 NOTE — Discharge Instructions (Addendum)
You came to the emergency department today with reports of Covid-19 like symptoms.  These symptoms may be due to Covid 19 or another viral illness.  You have a COVID test pending. Please isolate at home while awaiting your results.  > If your test is negative, stay home until your fever has resolved/your symptoms are improving. > If your test is positive, isolate at home for at least 7 days after the day your symptoms initially began, and THEN at least 24 hours after you are fever-free without the help of medications (Tylenol/acetaminophen and Advil/ibuprofen/Motrin) AND your symptoms are improving.  You can alternate Tylenol/acetaminophen and Advil/ibuprofen/Motrin every 4 hours for sore throat, body aches, headache or fever.  Drink plenty of water.  Use saline nasal spray for congestion. You can take Tessalon every 8 hours as needed for cough. You can take Zofran every 8 hours as needed for nausea and vomiting. Wash your hands frequently. Please rest as needed with frequent repositioning and ambulation as tolerated.    If you use a CPAP or BiPAP device for management of obstructive sleep apnea may continue to use it however use it when isolated from other individuals to avoid spread of COVID-19.   If you use a nebulizer administer medication such as albuterol you may continue to use it however only one isolated from other individuals to avoid the spread of COVID-19.  If your symptoms do not improve please follow-up with your primary care provider or urgent care.  Return to the ER for significant shortness of breath, uncontrollable vomiting, severe chest pain, inability to tolerate fluids, changes in mental status such as confusion or other concerning symptoms.

## 2021-01-25 NOTE — ED Provider Notes (Signed)
[facility] DEPT Provider Note   CSN: [id number] Arrival date & time: 01/25/21  1052     History Chief Complaint  Patient presents with  . Cough  . Shortness of Breath    Justin Mcgee is a 86 y.o. male with a history of chronic kidney disease stage III, GERD, former smoker.  Patient presents today with chief complaint of cough and shortness of breath after COVID-19 exposure.  Patient reports that his wife tested positive for COVID-19 on Wednesday.  Last night patient developed a productive cough and experienced some shortness of breath with this cough.  Patient is unsure the color of the mucus he was producing. Patient reports that his cough has improved today.  This morning patient reports that he used a at home pulse ox and found his oxygen saturation to be 87% on room air.  Patient reports that his oxygen levels steadily increased after putting his pulse oximeter on.  Patient denies having any shortness of breath at this time.   Patient denies any hemoptysis, fevers, chills, loss of smell or taste, rhinorrhea, congestion, sore throat, chest pain, nausea, vomiting, abdominal pain, lightheadedness, dizziness, syncopal episodes myalgias.    Patient reports vaccination for COVID-19 x2; has not received booster.  Has not received influenza vaccine.  Patient has no history of asthma or COPD.  HPI     Past Medical History:  Diagnosis Date  . 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 Former smoker   . Fracture of right radius 1956  . Fracture, metacarpal 1956   1, 2, 3  . Fracture, tibia 1964  . 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 Marica Otter, no radiation or chemo, clear LN.     Patient Active Problem List   Diagnosis Date Noted  . Digital mucinous cyst of right thumb 12/06/2020  . Primary osteoarthritis of left shoulder 01/11/2020  . CKD (chronic kidney disease) stage 3, GFR 30-59 ml/min (HCC) 01/04/2020  .  Anemia 08/28/2019  . Mass of subcutaneous tissue of back 07/11/2019  . Cervical lymphadenopathy 07/11/2019  . Nummular eczematous dermatitis 05/16/2018  . Other viral warts 05/02/2018  . Paroxysmal atrial tachycardia by electrocardiography ([place]) 12/23/2016  . Unsteady gait 12/08/2016  . Uses hearing aid 08/06/2016  . Neuropathy, lumbosacral (radicular) 08/08/2015  . 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 Primary osteoarthritis of both knees 02/02/2014  . IFG (impaired fasting glucose) 04/19/2013  . GERD (gastroesophageal reflux disease) 03/22/2013  . History of squamous cell carcinoma of tongue 03/22/2013  . History of basal cell carcinoma of skin 03/22/2013  . BPH (benign prostatic hyperplasia) 03/22/2013  . Former smoker   . Tinea corporis 05/24/2012    Past Surgical History:  Procedure Laterality Date  . APPENDECTOMY  1997  . CATARACT EXTRACTION    . CHOLECYSTECTOMY  05/16/10   in [place] while on vacation  . [place]  . INGUINAL HERNIA REPAIR Left 1994  . PARTIAL GLOSSECTOMY     with unilat neck dissection       Family History  Problem Relation Age of Onset  . GER disease Father   . Other Father        [place]  . Diabetes Father   . Colon cancer Mother   . Diabetes Other     Social History   Tobacco Use  . Smoking status: Former Smoker    Quit date: 02/19/1978  Years since quitting: 42.9  . Smokeless tobacco: Never Used  Vaping Use  . Vaping Use: Never used  Substance Use Topics  . Alcohol use: No    Alcohol/week: 0.0 standard drinks  . Drug use: No    Home Medications Prior to Admission medications   Medication Sig Start Date End Date Taking? Authorizing Provider  celecoxib (CELEBREX) 100 MG capsule TAKE 2 CAPSULES DAILY WITH BREAKFAST 11/25/20   Silverio Decamp, MD  Multiple Vitamin (MULTIVITAMIN WITH MINERALS) TABS tablet Take 1 tablet by mouth daily.    [provider]  mupirocin ointment  (BACTROBAN) 2 % Apply 1 application topically 2 (two) times daily. Patient not taking: Reported on 01/02/2021 07/24/20   Luetta Nutting, DO  pantoprazole (PROTONIX) 40 MG tablet Take 1 tablet (40 mg total) by mouth 2 (two) times daily. Patient not taking: Reported on 01/02/2021 06/21/20   Hali Marry, MD  pregabalin (LYRICA) 100 MG capsule Take 1 capsule (100 mg total) by mouth 2 (two) times daily. 01/02/21   Luetta Nutting, DO    Allergies    Flomax [tamsulosin hcl], Gabapentin, Sulfa antibiotics, and Other  Review of Systems   Review of Systems  Constitutional: Negative for appetite change, chills, fatigue and fever.  HENT: Negative for congestion, rhinorrhea and sore throat.   Eyes: Negative for visual disturbance.  Respiratory: Positive for cough and shortness of breath.   Cardiovascular: Negative for chest pain and leg swelling.  Gastrointestinal: Negative for abdominal pain, constipation, diarrhea, nausea and vomiting.  Genitourinary: Negative for difficulty urinating, dysuria and hematuria.  Musculoskeletal: Negative for back pain and neck pain.  Skin: Negative for color change, pallor, rash and wound.  Neurological: Negative for dizziness, syncope, weakness, light-headedness and headaches.  Psychiatric/Behavioral: Negative for confusion.    Physical Exam Updated Vital Signs BP (!) 103/57   Pulse 67   Temp 99.1 F (37.3 C)   Resp 18   Ht 6\' 3"  (1.905 m)   Wt 83 kg   SpO2 98%   BMI 22.87 kg/m   Physical Exam Vitals and nursing note reviewed.  Constitutional:      General: He is not in acute distress.    Appearance: He is not ill-appearing, toxic-appearing or diaphoretic.  HENT:     Head: Normocephalic.  Eyes:     General: No scleral icterus.       Right eye: No discharge.        Left eye: No discharge.  Cardiovascular:     Rate and Rhythm: Normal rate.     Pulses:          Radial pulses are 3+ on the right side and 3+ on the left side.     Heart  sounds: Normal heart sounds.  Pulmonary:     Effort: Pulmonary effort is normal. No tachypnea, bradypnea, accessory muscle usage or respiratory distress.     Breath sounds: Normal breath sounds. No stridor.     Comments: Able to speak in complete sentences without difficulty Oxygen saturation 97% on room air Abdominal:     Palpations: Abdomen is soft.     Tenderness: There is no abdominal tenderness.  Musculoskeletal:     Cervical back: Neck supple.     Right lower leg: No swelling, tenderness or bony tenderness. No edema.     Left lower leg: No swelling, tenderness or bony tenderness. No edema.  Skin:    General: Skin is warm and dry.     Coloration: Skin is  not cyanotic or pale.  Neurological:     General: No focal deficit present.     Mental Status: He is alert.  Psychiatric:        Behavior: Behavior is cooperative.     ED Results / Procedures / Treatments   Labs (all labs ordered are listed, but only abnormal results are displayed) Labs Reviewed  RESP PANEL BY RT-PCR (FLU A&B, COVID) ARPGX2 - Abnormal; Notable for the following components:      Result Value   SARS Coronavirus 2 by RT PCR POSITIVE (*)    All other components within normal limits    EKG None  Radiology No results found.  Procedures Procedures   Medications Ordered in ED Medications - No data to display  ED Course  I have reviewed the triage vital signs and the nursing notes.  Pertinent labs & imaging results that were available during my care of the patient were reviewed by me and considered in my medical decision making (see chart for details).    MDM Rules/Calculators/A&P                          Alert 86 year old male no acute distress, nontoxic-appearing.  Patient presents with chief complaint of productive cough and shortness of breath after being exposed to COVID-19.  Patient reports his cough and shortness of breath has improved last night.  Patient denies any shortness of breath at  present.  Patient has received COVID-19 vaccination x2; no booster or influenza vaccination.  Patient oxygen saturation 97% on room air.  Patient able speak in full complete sentences without difficulty.  Lungs clear to auscultation bilaterally.  I personally ambulated the patient and oxygen saturation was found to be 95% after 2 minutes of ambulation.  Chest x-ray shows coarse interstitial lung markings bilaterally, right greater than left, compatible with chronic interstitial lung disease versus atypical pneumonia. Patient is a former smoker however denies any history of COPD or asthma.  Patient's chest x-ray more likely due to atypical pneumonia.  COVID-19 test positive.  Patient is afebrile and hemodynamically stable and in no acute distress.  Patient was prescribed Paxlovid, Tessalon, and Zofran.  Discussed results, findings, treatment and follow up. Patient advised of return precautions. Patient verbalized understanding and agreed with plan.  Justin Mcgee was evaluated in Emergency Department on 01/25/2021 for the symptoms described in the history of present illness. He was evaluated in the context of the global COVID-19 pandemic, which necessitated consideration that the patient might be at risk for infection with the SARS-CoV-2 virus that causes COVID-19. Institutional protocols and algorithms that pertain to the evaluation of patients at risk for COVID-19 are in a state of rapid change based on information released by regulatory bodies including the CDC and federal and state organizations. These policies and algorithms were followed during the patient's care in the ED.   Final Clinical Impression(s) / ED Diagnoses Final diagnoses:  Cough  SOB (shortness of breath)  COVID-19    Rx / DC Orders ED Discharge Orders         Ordered    Nirmatrelvir & Ritonavir (PAXLOVID) 20 x 150 MG & 10 x 100MG  TBPK  2 times daily        01/25/21 1344    benzonatate (TESSALON) 100 MG capsule  Every 8  hours        01/25/21 1350    ondansetron (ZOFRAN ODT) 4 MG disintegrating tablet  Every 8  hours PRN        01/25/21 1350           Dyann Ruddle 01/25/21 2359    Arnaldo Natal, MD 01/26/21 [phone]

## 2021-01-25 NOTE — ED Triage Notes (Addendum)
Patient reports having a Covid exposure from his wife who was diagnosed on Wednesday. This AM patient stated O2 sats were around 87%. Last night patient reports having a productive cough and SOB. Today the cough has been nonproductive and SOB has subsided.

## 2021-01-27 ENCOUNTER — Other Ambulatory Visit: Payer: Self-pay | Admitting: Family Medicine

## 2021-01-27 MED ORDER — PAXLOVID 20 X 150 MG & 10 X 100MG PO TBPK: 1.0000 | ORAL_TABLET | Freq: Two times a day (BID) | ORAL | 0 refills | Status: AC

## 2021-01-31 ENCOUNTER — Other Ambulatory Visit: Payer: Self-pay | Admitting: Orthopedic Surgery

## 2021-01-31 DIAGNOSIS — Z01811 Encounter for preprocedural respiratory examination: Secondary | ICD-10-CM

## 2021-02-10 ENCOUNTER — Telehealth: Payer: Self-pay

## 2021-02-10 NOTE — Telephone Encounter (Signed)
Pt lvm stating post-COVID follow-up appt was requested by the hospital notation.   Advised Mr. Justin Mcgee if his symptoms have resolved and he has no concerns a follow-up appt is unnecessary.   He agreed. Advised per Dr. Zigmund Daniel.

## 2021-02-25 NOTE — Care Plan (Signed)
Ortho Bundle Case Management Note  Patient Details  Name: Justin Mcgee MRN: [id number] Date of Birth: May 27, 1935  Spoke with patient prior to surgery. He will discharge to home with family to assist. Has walker. CPM ordered. HHPT referral to [facility]. OPPT set up at [facility].  Patient and MD in agreement with plan. Choice offered.                     DME Arranged:  CPM DME Agency:  [employer]  HH Arranged:  PT HH Agency:  [facility]  Additional Comments: Please contact me with any questions of if this plan should need to change.  Ladell Heads,  [facility]  [phone] 02/25/2021, 10:02 AM

## 2021-02-25 NOTE — Progress Notes (Signed)
DUE TO COVID-19 ONLY ONE VISITOR IS ALLOWED TO COME WITH YOU AND STAY IN THE WAITING ROOM ONLY DURING PRE OP AND PROCEDURE DAY OF SURGERY. THE 1 VISITOR  MAY VISIT WITH YOU AFTER SURGERY IN YOUR PRIVATE ROOM DURING VISITING HOURS ONLY!  YOU NEED TO HAVE A COVID 19 TEST ON_______ @_______ , THIS TEST MUST BE DONE BEFORE SURGERY,  COVID TESTING SITE [address] [place] [address], IT IS ON THE RIGHT GOING OUT [address] APPROXIMATELY  2 MINUTES PAST [employer] ON THE RIGHT. ONCE YOUR COVID TEST IS COMPLETED,  PLEASE BEGIN THE QUARANTINE INSTRUCTIONS AS OUTLINED IN YOUR HANDOUT.                Justin Mcgee  02/25/2021   Your procedure is scheduled on:  03/07/2021   Report to [facility] Main  Entrance   Report to admitting at    0750am  AM     Call this number if you have problems the morning of surgery [phone]    REMEMBER: NO  SOLID FOOD CANDY OR GUM AFTER MIDNIGHT. CLEAR LIQUIDS UNTIL  0720am         . NOTHING BY MOUTH EXCEPT CLEAR LIQUIDS UNTIL    0720am    . PLEASE FINISH ENSURE DRINK PER SURGEON ORDER  WHICH NEEDS TO BE COMPLETED AT   0720am    .      CLEAR LIQUID DIET   Foods Allowed                                                                    Coffee and tea, regular and decaf                            Fruit ices (not with fruit pulp)                                      Iced Popsicles                                    Carbonated beverages, regular and diet                                    Cranberry, grape and apple juices Sports drinks like Gatorade Lightly seasoned clear broth or consume(fat free) Sugar, honey syrup ___________________________________________________________________      BRUSH YOUR TEETH MORNING OF SURGERY AND RINSE YOUR MOUTH OUT, NO CHEWING GUM CANDY OR MINTS.     Take these medicines the morning of surgery with A SIP OF WATER:  Protonix, lyrica  DO NOT TAKE ANY DIABETIC MEDICATIONS DAY OF YOUR  SURGERY                               You may not have any metal on your body including hair pins and  piercings  Do not wear jewelry, make-up, lotions, powders or perfumes, deodorant             Do not wear nail polish on your fingernails.  Do not shave  48 hours prior to surgery.              Men may shave face and neck.   Do not bring valuables to the hospital. [place].  Contacts, dentures or bridgework may not be worn into surgery.  Leave suitcase in the car. After surgery it may be brought to your room.     Patients discharged the day of surgery will not be allowed to drive home. IF YOU ARE HAVING SURGERY AND GOING HOME THE SAME DAY, YOU MUST HAVE AN ADULT TO DRIVE YOU HOME AND BE WITH YOU FOR 24 HOURS. YOU MAY GO HOME BY TAXI OR UBER OR ORTHERWISE, BUT AN ADULT MUST ACCOMPANY YOU HOME AND STAY WITH YOU FOR 24 HOURS.  Name and phone number of your driver:  Special Instructions: N/A              Please read over the following fact sheets you were given: _____________________________________________________________________  [facility] - Preparing for Surgery Before surgery, you can play an important role.  Because skin is not sterile, your skin needs to be as free of germs as possible.  You can reduce the number of germs on your skin by washing with CHG (chlorahexidine gluconate) soap before surgery.  CHG is an antiseptic cleaner which kills germs and bonds with the skin to continue killing germs even after washing. Please DO NOT use if you have an allergy to CHG or antibacterial soaps.  If your skin becomes reddened/irritated stop using the CHG and inform your nurse when you arrive at Short Stay. Do not shave (including legs and underarms) for at least 48 hours prior to the first CHG shower.  You may shave your face/neck. Please follow these instructions carefully:  1.  Shower with CHG Soap the night before surgery and the   morning of Surgery.  2.  If you choose to wash your hair, wash your hair first as usual with your  normal  shampoo.  3.  After you shampoo, rinse your hair and body thoroughly to remove the  shampoo.                           4.  Use CHG as you would any other liquid soap.  You can apply chg directly  to the skin and wash                       Gently with a scrungie or clean washcloth.  5.  Apply the CHG Soap to your body ONLY FROM THE NECK DOWN.   Do not use on face/ open                           Wound or open sores. Avoid contact with eyes, ears mouth and genitals (private parts).                       Wash face,  Genitals (private parts) with your normal soap.             6.  Wash thoroughly, paying special attention to the area where your surgery  will be performed.  7.  Thoroughly rinse your body with warm water from the neck down.  8.  DO NOT shower/wash with your normal soap after using and rinsing off  the CHG Soap.                9.  Pat yourself dry with a clean towel.            10.  Wear clean pajamas.            11.  Place clean sheets on your bed the night of your first shower and do not  sleep with pets. Day of Surgery : Do not apply any lotions/deodorants the morning of surgery.  Please wear clean clothes to the hospital/surgery center.  FAILURE TO FOLLOW THESE INSTRUCTIONS MAY RESULT IN THE CANCELLATION OF YOUR SURGERY PATIENT SIGNATURE_________________________________  NURSE SIGNATURE__________________________________  ________________________________________________________________________

## 2021-02-28 ENCOUNTER — Ambulatory Visit (HOSPITAL_COMMUNITY)
Admission: RE | Admit: 2021-02-28 | Discharge: 2021-02-28 | Disposition: A | Discharge status: Home / Self Care | Payer: Medicare Other | Source: Ambulatory Visit | Attending: Orthopedic Surgery | Admitting: Orthopedic Surgery

## 2021-02-28 ENCOUNTER — Encounter (HOSPITAL_COMMUNITY): Payer: Self-pay

## 2021-02-28 ENCOUNTER — Other Ambulatory Visit: Payer: Self-pay

## 2021-02-28 ENCOUNTER — Encounter (HOSPITAL_COMMUNITY)
Admission: RE | Admit: 2021-02-28 | Discharge: 2021-02-28 | Disposition: A | Discharge status: Home / Self Care | Payer: Medicare Other | Source: Ambulatory Visit | Attending: Orthopedic Surgery | Admitting: Orthopedic Surgery

## 2021-02-28 DIAGNOSIS — Z01811 Encounter for preprocedural respiratory examination: Secondary | ICD-10-CM | POA: Insufficient documentation

## 2021-02-28 DIAGNOSIS — Z01818 Encounter for other preprocedural examination: Secondary | ICD-10-CM | POA: Diagnosis not present

## 2021-02-28 DIAGNOSIS — J984 Other disorders of lung: Secondary | ICD-10-CM | POA: Insufficient documentation

## 2021-02-28 DIAGNOSIS — J219 Acute bronchiolitis, unspecified: Secondary | ICD-10-CM | POA: Diagnosis not present

## 2021-02-28 HISTORY — DX: Unspecified osteoarthritis, unspecified site: M19.90

## 2021-02-28 LAB — COMPREHENSIVE METABOLIC PANEL
ALT: 14 U/L (ref 0–44)
AST: 21 U/L (ref 15–41)
Albumin: 4.3 g/dL (ref 3.5–5.0)
Alkaline Phosphatase: 81 U/L (ref 38–126)
Anion gap: 8 (ref 5–15)
BUN: 20 mg/dL (ref 8–23)
CO2: 28 mmol/L (ref 22–32)
Calcium: 9.8 mg/dL (ref 8.9–10.3)
Chloride: 106 mmol/L (ref 98–111)
Creatinine, Ser: 1.12 mg/dL (ref 0.61–1.24)
GFR, Estimated: >60 mL/min (ref >60)
Glucose, Bld: 119 mg/dL — ABNORMAL HIGH (ref 70–99)
Potassium: 4.3 mmol/L (ref 3.5–5.1)
Sodium: 142 mmol/L (ref 135–145)
Total Bilirubin: 0.5 mg/dL (ref 0.3–1.2)
Total Protein: 7.6 g/dL (ref 6.5–8.1)

## 2021-02-28 LAB — CBC WITH DIFFERENTIAL/PLATELET
Abs Immature Granulocytes: 0.02 10*3/uL (ref 0.00–0.07)
Basophils Absolute: 0 10*3/uL (ref 0.0–0.1)
Basophils Relative: 1 %
Eosinophils Absolute: 0.2 10*3/uL (ref 0.0–0.5)
Eosinophils Relative: 4 %
HCT: 42.6 % (ref 39.0–52.0)
Hemoglobin: 14 g/dL (ref 13.0–17.0)
Immature Granulocytes: 0 %
Lymphocytes Relative: 23 %
Lymphs Abs: 1.4 10*3/uL (ref 0.7–4.0)
MCH: 30.5 pg (ref 26.0–34.0)
MCHC: 32.9 g/dL (ref 30.0–36.0)
MCV: 92.8 fL (ref 80.0–100.0)
Monocytes Absolute: 0.6 10*3/uL (ref 0.1–1.0)
Monocytes Relative: 10 %
Neutro Abs: 3.8 10*3/uL (ref 1.7–7.7)
Neutrophils Relative %: 62 %
Platelets: 160 10*3/uL (ref 150–400)
RBC: 4.59 MIL/uL (ref 4.22–5.81)
RDW: 12.8 % (ref 11.5–15.5)
WBC: 6 10*3/uL (ref 4.0–10.5)
nRBC: 0 % (ref 0.0–0.2)

## 2021-02-28 LAB — URINALYSIS, ROUTINE W REFLEX MICROSCOPIC
Bacteria, UA: NONE SEEN
Bilirubin Urine: NEGATIVE
Glucose, UA: NEGATIVE mg/dL
Hgb urine dipstick: NEGATIVE
Ketones, ur: NEGATIVE mg/dL
Nitrite: NEGATIVE
Protein, ur: NEGATIVE mg/dL
Specific Gravity, Urine: 1.012 (ref 1.005–1.030)
pH: 6 (ref 5.0–8.0)

## 2021-02-28 LAB — PROTIME-INR
INR: 1 (ref 0.8–1.2)
Prothrombin Time: 12.9 seconds (ref 11.4–15.2)

## 2021-02-28 LAB — APTT: aPTT: 34 seconds (ref 24–36)

## 2021-02-28 LAB — SURGICAL PCR SCREEN
MRSA, PCR: NEGATIVE
Staphylococcus aureus: NEGATIVE

## 2021-02-28 NOTE — Progress Notes (Addendum)
Anesthesia Review:  PCP: Luetta Nutting , Md LOV 01/02/21 pt reports md is aware he is having surgery.  Cardiologist : Chest x-ray : 02/28/21  Routed to Dr  Dorna Leitz on 03/03/2021  EKG :02/28/21 Echo : 2018  Stress test: 2017  Cardiac Cath :  Activity level: can do a flight of stairs without difficulty  Sleep Study/ CPAP : none  Fasting Blood Sugar :      / Checks Blood Sugar -- times a day:   Blood Thinner/ Instructions /Last Dose: ASA / Instructions/ Last Dose :  Positive for covid on 01/25/21 results in epic and on front of chart U/A done 02/28/21 routed to Dr Berenice Primas

## 2021-03-06 MED ORDER — BUPIVACAINE LIPOSOME 1.3 % IJ SUSP
20.0000 mL | Freq: Once | INTRAMUSCULAR | Status: DC
  Filled 2021-03-06 (×2): qty 20

## 2021-03-06 NOTE — Anesthesia Preprocedure Evaluation (Addendum)
Anesthesia Evaluation  Patient identified by MRN, date of birth, ID band Patient awake    Reviewed: Allergy & Precautions, NPO status , Patient's Chart, lab work & pertinent test results  Airway Mallampati: II  TM Distance: >3 FB     Dental  (+) Dental Advisory Given   Pulmonary former smoker,    breath sounds clear to auscultation       Cardiovascular negative cardio ROS   Rhythm:Regular Rate:Normal     Neuro/Psych  Neuromuscular disease    GI/Hepatic Neg liver ROS, GERD  ,  Endo/Other  negative endocrine ROS  Renal/GU Renal disease     Musculoskeletal  (+) Arthritis ,   Abdominal   Peds  Hematology negative hematology ROS (+)   Anesthesia Other Findings   Reproductive/Obstetrics                            Lab Results  Component Value Date   WBC 6.0 02/28/2021   HGB 14.0 02/28/2021   HCT 42.6 02/28/2021   MCV 92.8 02/28/2021   PLT 160 02/28/2021   Lab Results  Component Value Date   CREATININE 1.12 02/28/2021   BUN 20 02/28/2021   NA 142 02/28/2021   K 4.3 02/28/2021   CL 106 02/28/2021   CO2 28 02/28/2021    Anesthesia Physical Anesthesia Plan  ASA: II  Anesthesia Plan: Spinal   Post-op Pain Management:  [facility]ional for Post-op pain   Induction:   PONV Risk Score and Plan: 1 and Propofol infusion, Ondansetron and Treatment may vary due to age or medical condition  Airway Management Planned: Natural Airway and Simple Face Mask  Additional Equipment:   Intra-op Plan:   Post-operative Plan:   Informed Consent: I have reviewed the patients History and Physical, chart, labs and discussed the procedure including the risks, benefits and alternatives for the proposed anesthesia with the patient or authorized representative who has indicated his/her understanding and acceptance.       Plan Discussed with:   Anesthesia Plan Comments:        Anesthesia Quick  Evaluation

## 2021-03-06 NOTE — H&P (Signed)
TOTAL KNEE ADMISSION H&P  Patient is being admitted for left total knee arthroplasty.  Subjective:  Chief Complaint:left knee pain.  HPI: Justin Mcgee, 86 y.o. male, has a history of pain and functional disability in the left knee due to arthritis and has failed non-surgical conservative treatments for greater than 12 weeks to includeNSAID's and/or analgesics, corticosteriod injections, viscosupplementation injections, flexibility and strengthening excercises, use of assistive devices and activity modification.  Onset of symptoms was gradual, starting 4 years ago with gradually worsening course since that time. The patient noted no past surgery on the left knee(s).  Patient currently rates pain in the left knee(s) at 9 out of 10 with activity. Patient has night pain, worsening of pain with activity and weight bearing, pain that interferes with activities of daily living, pain with passive range of motion and joint swelling.  Patient has evidence of subchondral cysts, periarticular osteophytes and joint space narrowing by imaging studies. This patient has had failure of all reasonable conservative care. There is no active infection.  Patient Active Problem List   Diagnosis Date Noted  . Digital mucinous cyst of right thumb 12/06/2020  . Primary osteoarthritis of left shoulder 01/11/2020  . CKD (chronic kidney disease) stage 3, GFR 30-59 ml/min (HCC) 01/04/2020  . Anemia 08/28/2019  . Mass of subcutaneous tissue of back 07/11/2019  . Cervical lymphadenopathy 07/11/2019  . Nummular eczematous dermatitis 05/16/2018  . Other viral warts 05/02/2018  . Paroxysmal atrial tachycardia by electrocardiography ([place]) 12/23/2016  . Unsteady gait 12/08/2016  . Uses hearing aid 08/06/2016  . Neuropathy, lumbosacral (radicular) 08/08/2015  . 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 Primary osteoarthritis of both knees 02/02/2014  . IFG (impaired fasting glucose) 04/19/2013  . GERD (gastroesophageal reflux  disease) 03/22/2013  . History of squamous cell carcinoma of tongue 03/22/2013  . History of basal cell carcinoma of skin 03/22/2013  . BPH (benign prostatic hyperplasia) 03/22/2013  . Former smoker   . Tinea corporis 05/24/2012   Past Medical History:  Diagnosis Date  . Arthritis   . 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 Former smoker   . Fracture of right radius 1956  . Fracture, metacarpal 1956   1, 2, 3  . Fracture, tibia 1964  . 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 Marica Otter, no radiation or chemo, clear LN.     Past Surgical History:  Procedure Laterality Date  . APPENDECTOMY  1997  . CATARACT EXTRACTION    . CHOLECYSTECTOMY  05/16/10   in [place] while on vacation  . [place]  . INGUINAL HERNIA REPAIR Left 1994  . PARTIAL GLOSSECTOMY     with unilat neck dissection    Current Facility-Administered Medications  Medication Dose Route Frequency Provider Last Rate Last Admin  . [START ON 03/07/2021] bupivacaine liposome (EXPAREL) 1.3 % injection 266 mg  20 mL Other Once Minda Ditto, [facility]       Current Outpatient Medications  Medication Sig Dispense Refill Last Dose  . carboxymethylcellulose (REFRESH PLUS) 0.5 % SOLN Place 1 drop into both eyes daily as needed (Dry eyes).     . celecoxib (CELEBREX) 100 MG capsule TAKE 2 CAPSULES DAILY WITH BREAKFAST (Patient taking differently: Take 200 mg by mouth daily with breakfast.) 190 capsule 3   . Multiple Vitamin (MULTIVITAMIN WITH MINERALS) TABS tablet Take 1 tablet by mouth daily.     . pantoprazole (PROTONIX) 40  MG tablet Take 1 tablet (40 mg total) by mouth 2 (two) times daily. 180 tablet 4   . pregabalin (LYRICA) 100 MG capsule Take 1 capsule (100 mg total) by mouth 2 (two) times daily. 60 capsule 0   . benzonatate (TESSALON) 100 MG capsule Take 1 capsule (100 mg total) by mouth every 8 (eight) hours. (Patient not taking: Reported on 02/21/2021) 21 capsule  0 Not Taking at Unknown time  . mupirocin ointment (BACTROBAN) 2 % Apply 1 application topically 2 (two) times daily. (Patient not taking: Reported on 02/21/2021) 22 g 0 Not Taking at Unknown time  . ondansetron (ZOFRAN ODT) 4 MG disintegrating tablet Take 1 tablet (4 mg total) by mouth every 8 (eight) hours as needed for nausea or vomiting. (Patient not taking: Reported on 02/21/2021) 20 tablet 0 Not Taking at Unknown time   Allergies  Allergen Reactions  . Flomax [Tamsulosin Hcl] Other (See Comments)    Dizziness  . Gabapentin Other (See Comments)    Unsteadiness and Balance problems.    . Other Other (See Comments)    Topical sulfa only. Gets skin irritation.  . Sulfa Antibiotics Rash    Social History   Tobacco Use  . Smoking status: Former Smoker    Quit date: 02/19/1978    Years since quitting: 43.0  . Smokeless tobacco: Never Used  Substance Use Topics  . Alcohol use: No    Alcohol/week: 0.0 standard drinks    Family History  Problem Relation Age of Onset  . GER disease Father   . Other Father        [place]  . Diabetes Father   . Colon cancer Mother   . Diabetes Other      Review of Systems ROS: I have reviewed the patient's review of systems thoroughly and there are no positive responses as relates to the HPI. Objective:  Physical Exam  Vital signs in last 24 hours:   Well-developed well-nourished patient in no acute distress. Alert and oriented x3 HEENT:within normal limits Cardiac: Regular rate and rhythm Pulmonary: Lungs clear to auscultation Abdomen: Soft and nontender.  Normal active bowel sounds  Musculoskeletal: left knee: Painful range of motion.  Limited range of motion.  No instability.  Trace effusion.  No erythema or warmth.  Subjective distally. Labs: Recent Results (from the past 2160 hour(s))  Resp Panel by RT-PCR (Flu A&B, Covid) Nasopharyngeal Swab     Status: Abnormal   Collection Time: 01/25/21 12:45 PM   Specimen: Nasopharyngeal  Swab; Nasopharyngeal(NP) swabs in vial transport medium  Result Value Ref Range   SARS Coronavirus 2 by RT PCR POSITIVE (A) NEGATIVE    Comment: RESULT CALLED TO, READ BACK BY AND VERIFIED WITH: LAMB,S. RN AT 1791 01/25/21 MULLINS,T (NOTE) SARS-CoV-2 target nucleic acids are DETECTED.  The SARS-CoV-2 RNA is generally detectable in upper respiratory specimens during the acute phase of infection. Positive results are indicative of the presence of the identified virus, but do not rule out bacterial infection or co-infection with other pathogens not detected by the test. Clinical correlation with patient history and other diagnostic information is necessary to determine patient infection status. The expected result is Negative.  Fact Sheet for Patients: EntrepreneurPulse.com.au  Fact Sheet for Healthcare Providers: IncredibleEmployment.be  This test is not yet approved or cleared by the [place] FDA and  has been authorized for detection and/or diagnosis of SARS-CoV-2 by FDA under an Emergency Use Authorization (EUA).  This EUA will remain in effect (meaning this  test ca n be used) for the duration of  the COVID-19 declaration under Section 564(b)(1) of the Act, 21 U.S.C. section 360bbb-3(b)(1), unless the authorization is terminated or revoked sooner.     Influenza A by PCR NEGATIVE NEGATIVE   Influenza B by PCR NEGATIVE NEGATIVE    Comment: (NOTE) The Xpert Xpress SARS-CoV-2/FLU/RSV plus assay is intended as an aid in the diagnosis of influenza from Nasopharyngeal swab specimens and should not be used as a sole basis for treatment. Nasal washings and aspirates are unacceptable for Xpert Xpress SARS-CoV-2/FLU/RSV testing.  Fact Sheet for Patients: EntrepreneurPulse.com.au  Fact Sheet for Healthcare Providers: IncredibleEmployment.be  This test is not yet approved or cleared by the [place] FDA  and has been authorized for detection and/or diagnosis of SARS-CoV-2 by FDA under an Emergency Use Authorization (EUA). This EUA will remain in effect (meaning this test can be used) for the duration of the COVID-19 declaration under Section 564(b)(1) of the Act, 21 U.S.C. section 360bbb-3(b)(1), unless the authorization is terminated or revoked.  Performed at [facility], [place] [address], [place], [place] [address]   CBC WITH DIFFERENTIAL     Status: None   Collection Time: 02/28/21 11:41 AM  Result Value Ref Range   WBC 6.0 4.0 - 10.5 K/uL   RBC 4.59 4.22 - 5.81 MIL/uL   Hemoglobin 14.0 13.0 - 17.0 g/dL   HCT 42.6 39.0 - 52.0 %   MCV 92.8 80.0 - 100.0 fL   MCH 30.5 26.0 - 34.0 pg   MCHC 32.9 30.0 - 36.0 g/dL   RDW 12.8 11.5 - 15.5 %   Platelets 160 150 - 400 K/uL   nRBC 0.0 0.0 - 0.2 %   Neutrophils Relative % 62 %   Neutro Abs 3.8 1.7 - 7.7 K/uL   Lymphocytes Relative 23 %   Lymphs Abs 1.4 0.7 - 4.0 K/uL   Monocytes Relative 10 %   Monocytes Absolute 0.6 0.1 - 1.0 K/uL   Eosinophils Relative 4 %   Eosinophils Absolute 0.2 0.0 - 0.5 K/uL   Basophils Relative 1 %   Basophils Absolute 0.0 0.0 - 0.1 K/uL   Immature Granulocytes 0 %   Abs Immature Granulocytes 0.02 0.00 - 0.07 K/uL    Comment: Performed at [facility], [place] [address] [place] [address], [place], [place] [address]  Comprehensive metabolic panel     Status: Abnormal   Collection Time: 02/28/21 11:41 AM  Result Value Ref Range   Sodium 142 135 - 145 mmol/L   Potassium 4.3 3.5 - 5.1 mmol/L   Chloride 106 98 - 111 mmol/L   CO2 28 22 - 32 mmol/L   Glucose, Bld 119 (H) 70 - 99 mg/dL    Comment: Glucose reference range applies only to samples taken after fasting for at least 8 hours.   BUN 20 8 - 23 mg/dL   Creatinine, Ser 1.12 0.61 - 1.24 mg/dL   Calcium 9.8 8.9 - 10.3 mg/dL   Total Protein 7.6 6.5 - 8.1 g/dL   Albumin 4.3 3.5 - 5.0 g/dL   AST 21 15 - 41 U/L   ALT 14 0 - 44 U/L    Alkaline Phosphatase 81 38 - 126 U/L   Total Bilirubin 0.5 0.3 - 1.2 mg/dL   GFR, Estimated >60 >60 mL/min    Comment: (NOTE) Calculated using the CKD-EPI Creatinine Equation (2021)    Anion gap 8 5 - 15    Comment: Performed at [facility], [place] [address] [place], [place] [address]  Protime-INR     Status: None   Collection Time: 02/28/21 11:41 AM  Result Value Ref Range   Prothrombin Time 12.9 11.4 - 15.2 seconds   INR 1.0 0.8 - 1.2    Comment: (NOTE) INR goal varies based on device and disease states. Performed at [facility], [place] [address], [place], [place] [address]   APTT     Status: None   Collection Time: 02/28/21 11:41 AM  Result Value Ref Range   aPTT 34 24 - 36 seconds    Comment: Performed at [facility], [place] [address], [place], [place] [address]  Urinalysis, Routine w reflex microscopic Urine, Clean Catch     Status: Abnormal   Collection Time: 02/28/21 11:41 AM  Result Value Ref Range   Color, Urine YELLOW YELLOW   APPearance CLEAR CLEAR   Specific Gravity, Urine 1.012 1.005 - 1.030   pH 6.0 5.0 - 8.0   Glucose, UA NEGATIVE NEGATIVE mg/dL   Hgb urine dipstick NEGATIVE NEGATIVE   Bilirubin Urine NEGATIVE NEGATIVE   Ketones, ur NEGATIVE NEGATIVE mg/dL   Protein, ur NEGATIVE NEGATIVE mg/dL   Nitrite NEGATIVE NEGATIVE   Leukocytes,Ua TRACE (A) NEGATIVE   RBC / HPF 0-5 0 - 5 RBC/hpf   WBC, UA 0-5 0 - 5 WBC/hpf   Bacteria, UA NONE SEEN NONE SEEN   Squamous Epithelial / LPF 0-5 0 - 5    Comment: Performed at [facility], [place] [address], [place], [place] [address]  Type and screen Order type and screen if day of surgery is less than 15 days from draw of preadmission visit or order morning of surgery if day of surgery is greater than 6 days from preadmission visit.     Status: None   Collection Time: 02/28/21 11:41 AM  Result Value Ref Range   ABO/RH(D) O POS    Antibody Screen NEG     Sample Expiration 03/14/2021,2359    Extend sample reason      NO TRANSFUSIONS OR PREGNANCY IN THE PAST 3 MONTHS Performed at [facility], [place] [address] [place], [place] [address]   Surgical pcr screen     Status: None   Collection Time: 02/28/21 12:25 PM   Specimen: Nasal Mucosa; Nasal Swab  Result Value Ref Range   MRSA, PCR NEGATIVE NEGATIVE   Staphylococcus aureus NEGATIVE NEGATIVE    Comment: (NOTE) The Xpert SA Assay (FDA approved for NASAL specimens in patients 14 years of age and older), is one component of a comprehensive surveillance program. It is not intended to diagnose infection nor to guide or monitor treatment. Performed at [facility], [place] [address], [place], [place] [address]     Estimated body mass index is 22.12 kg/m as calculated from the following:   Height as of 02/28/21: 6\' 3"  (1.905 m).   Weight as of 02/28/21: 80.3 kg.   Imaging Review Plain radiographs demonstrate severe degenerative joint disease of the left knee(s). The overall alignment ismild varus. The bone quality appears to be fair for age and reported activity level.      Assessment/Plan:  End stage arthritis, left knee   The patient history, physical examination, clinical judgment of the provider and imaging studies are consistent with end stage degenerative joint disease of the left knee(s) and total knee arthroplasty is deemed medically necessary. The treatment options including medical management, injection therapy arthroscopy  and arthroplasty were discussed at length. The risks and benefits of total knee arthroplasty were presented and reviewed. The risks due to aseptic loosening, infection, stiffness, patella tracking problems, thromboembolic complications and other imponderables were discussed. The patient acknowledged the explanation, agreed to proceed with the plan and consent was signed. Patient is being admitted for inpatient treatment for  surgery, pain control, PT, OT, prophylactic antibiotics, VTE prophylaxis, progressive ambulation and ADL's and discharge planning. The patient is planning to be discharged home with home health services     Patient's anticipated LOS is less than 2 midnights, meeting these requirements: - Younger than 43 - Lives within 1 hour of care - Has a competent adult at home to recover with post-op recover - NO history of  - Chronic pain requiring opiods  - Diabetes  - Coronary Artery Disease  - Heart failure  - Heart attack  - Stroke  - DVT/VTE  - Cardiac arrhythmia  - Respiratory Failure/COPD  - Renal failure  - Anemia  - Advanced Liver disease

## 2021-03-06 NOTE — Progress Notes (Signed)
Called pt about Dr Berenice Primas wanting to move his surgery earlier for 03/07/21. [facility] to [facility] for 0830 surgery. To complete his ERAS drink by 0530. Patient verbalizes understanding.

## 2021-03-07 ENCOUNTER — Ambulatory Visit (HOSPITAL_COMMUNITY): Payer: Medicare Other | Admitting: Emergency Medicine

## 2021-03-07 ENCOUNTER — Ambulatory Visit (HOSPITAL_COMMUNITY)
Admission: RE | Admit: 2021-03-07 | Discharge: 2021-03-07 | Disposition: A | Discharge status: Home / Self Care | Payer: Medicare Other | Attending: Orthopedic Surgery | Admitting: Orthopedic Surgery

## 2021-03-07 ENCOUNTER — Encounter (HOSPITAL_COMMUNITY)
Admission: RE | Disposition: A | Discharge status: Home / Self Care | Payer: Self-pay | Source: Home / Self Care | Attending: Orthopedic Surgery

## 2021-03-07 ENCOUNTER — Ambulatory Visit (HOSPITAL_COMMUNITY): Payer: Medicare Other | Admitting: Certified Registered Nurse Anesthetist

## 2021-03-07 ENCOUNTER — Encounter (HOSPITAL_COMMUNITY): Payer: Self-pay | Admitting: Orthopedic Surgery

## 2021-03-07 DIAGNOSIS — Z79899 Other long term (current) drug therapy: Secondary | ICD-10-CM | POA: Insufficient documentation

## 2021-03-07 DIAGNOSIS — N183 Chronic kidney disease, stage 3 unspecified: Secondary | ICD-10-CM | POA: Diagnosis not present

## 2021-03-07 DIAGNOSIS — Z882 Allergy status to sulfonamides status: Secondary | ICD-10-CM | POA: Insufficient documentation

## 2021-03-07 DIAGNOSIS — D631 Anemia in chronic kidney disease: Secondary | ICD-10-CM | POA: Diagnosis not present

## 2021-03-07 DIAGNOSIS — Z888 Allergy status to other drugs, medicaments and biological substances status: Secondary | ICD-10-CM | POA: Diagnosis not present

## 2021-03-07 DIAGNOSIS — Z87891 Personal history of nicotine dependence: Secondary | ICD-10-CM | POA: Insufficient documentation

## 2021-03-07 DIAGNOSIS — N400 Enlarged prostate without lower urinary tract symptoms: Secondary | ICD-10-CM | POA: Diagnosis not present

## 2021-03-07 DIAGNOSIS — M1712 Unilateral primary osteoarthritis, left knee: Secondary | ICD-10-CM | POA: Diagnosis not present

## 2021-03-07 DIAGNOSIS — G8918 Other acute postprocedural pain: Secondary | ICD-10-CM | POA: Diagnosis not present

## 2021-03-07 HISTORY — PX: TOTAL KNEE ARTHROPLASTY: SHX125

## 2021-03-07 LAB — TYPE AND SCREEN
ABO/RH(D): O POS
Antibody Screen: NEGATIVE

## 2021-03-07 LAB — ABO/RH: ABO/RH(D): O POS

## 2021-03-07 SURGERY — ARTHROPLASTY, KNEE, TOTAL
Anesthesia: General | Site: Knee | Laterality: Left

## 2021-03-07 MED ORDER — DEXAMETHASONE SODIUM PHOSPHATE 10 MG/ML IJ SOLN
INTRAMUSCULAR | Status: AC
  Filled 2021-03-07: qty 1

## 2021-03-07 MED ORDER — METHOCARBAMOL 500 MG IVPB - SIMPLE MED
500.0000 mg | Freq: Once | INTRAVENOUS | Status: AC
  Administered 2021-03-07: 500 mg via INTRAVENOUS

## 2021-03-07 MED ORDER — ONDANSETRON HCL 4 MG/2ML IJ SOLN
INTRAMUSCULAR | Status: AC
  Filled 2021-03-07: qty 2

## 2021-03-07 MED ORDER — GLYCOPYRROLATE PF 0.2 MG/ML IJ SOSY
PREFILLED_SYRINGE | INTRAMUSCULAR | Status: AC
  Filled 2021-03-07: qty 1

## 2021-03-07 MED ORDER — ONDANSETRON HCL 4 MG/2ML IJ SOLN
INTRAMUSCULAR | Status: DC | PRN
  Administered 2021-03-07: 4 mg via INTRAVENOUS

## 2021-03-07 MED ORDER — PHENYLEPHRINE HCL-NACL 10-0.9 MG/250ML-% IV SOLN
INTRAVENOUS | Status: DC | PRN
  Administered 2021-03-07: 30 ug/min via INTRAVENOUS

## 2021-03-07 MED ORDER — SODIUM CHLORIDE 0.9 % IR SOLN
Status: DC | PRN
  Administered 2021-03-07: 1000 mL

## 2021-03-07 MED ORDER — WATER FOR IRRIGATION, STERILE IR SOLN
Status: DC | PRN
  Administered 2021-03-07: 2000 mL

## 2021-03-07 MED ORDER — DOCUSATE SODIUM 100 MG PO CAPS: 100.0000 mg | ORAL_CAPSULE | Freq: Two times a day (BID) | ORAL | 0 refills | Status: DC

## 2021-03-07 MED ORDER — BUPIVACAINE-EPINEPHRINE 0.5% -1:200000 IJ SOLN
INTRAMUSCULAR | Status: AC
  Filled 2021-03-07: qty 1

## 2021-03-07 MED ORDER — CEFAZOLIN SODIUM-DEXTROSE 2-4 GM/100ML-% IV SOLN: 2.0000 g | Freq: Once | INTRAVENOUS | Status: DC

## 2021-03-07 MED ORDER — LACTATED RINGERS IV BOLUS
500.0000 mL | Freq: Once | INTRAVENOUS | Status: AC
  Administered 2021-03-07: 500 mL via INTRAVENOUS

## 2021-03-07 MED ORDER — OXYCODONE-ACETAMINOPHEN 5-325 MG PO TABS: 1.0000 | ORAL_TABLET | Freq: Four times a day (QID) | ORAL | 0 refills | Status: DC | PRN

## 2021-03-07 MED ORDER — LACTATED RINGERS IV SOLN: INTRAVENOUS | Status: DC

## 2021-03-07 MED ORDER — FENTANYL CITRATE (PF) 100 MCG/2ML IJ SOLN
INTRAMUSCULAR | Status: AC
  Filled 2021-03-07: qty 2

## 2021-03-07 MED ORDER — FENTANYL CITRATE (PF) 100 MCG/2ML IJ SOLN
INTRAMUSCULAR | Status: AC
  Administered 2021-03-07: 25 ug via INTRAVENOUS
  Filled 2021-03-07: qty 2

## 2021-03-07 MED ORDER — 0.9 % SODIUM CHLORIDE (POUR BTL) OPTIME
TOPICAL | Status: DC | PRN
  Administered 2021-03-07: 1000 mL

## 2021-03-07 MED ORDER — PROPOFOL 10 MG/ML IV BOLUS
INTRAVENOUS | Status: AC
  Filled 2021-03-07: qty 20

## 2021-03-07 MED ORDER — PROPOFOL 10 MG/ML IV BOLUS
INTRAVENOUS | Status: DC | PRN
  Administered 2021-03-07: 160 mg via INTRAVENOUS
  Administered 2021-03-07: 20 mg via INTRAVENOUS
  Administered 2021-03-07 (×2): 10 mg via INTRAVENOUS

## 2021-03-07 MED ORDER — PHENYLEPHRINE HCL (PRESSORS) 10 MG/ML IV SOLN
INTRAVENOUS | Status: AC
  Filled 2021-03-07: qty 1

## 2021-03-07 MED ORDER — GLYCOPYRROLATE 0.2 MG/ML IJ SOLN
INTRAMUSCULAR | Status: DC | PRN
  Administered 2021-03-07 (×2): .1 mg via INTRAVENOUS

## 2021-03-07 MED ORDER — TIZANIDINE HCL 2 MG PO TABS: 2.0000 mg | ORAL_TABLET | Freq: Three times a day (TID) | ORAL | 0 refills | Status: DC | PRN

## 2021-03-07 MED ORDER — MIDAZOLAM HCL 2 MG/2ML IJ SOLN
INTRAMUSCULAR | Status: AC
  Filled 2021-03-07: qty 2

## 2021-03-07 MED ORDER — ROPIVACAINE HCL 5 MG/ML IJ SOLN
INTRAMUSCULAR | Status: DC | PRN
  Administered 2021-03-07: 20 mL via PERINEURAL

## 2021-03-07 MED ORDER — LACTATED RINGERS IV BOLUS: 250.0000 mL | Freq: Once | INTRAVENOUS | Status: DC

## 2021-03-07 MED ORDER — CEFAZOLIN SODIUM-DEXTROSE 2-4 GM/100ML-% IV SOLN
2.0000 g | INTRAVENOUS | Status: AC
  Administered 2021-03-07: 2 g via INTRAVENOUS
  Filled 2021-03-07: qty 100

## 2021-03-07 MED ORDER — TRANEXAMIC ACID-NACL 1000-0.7 MG/100ML-% IV SOLN
1000.0000 mg | INTRAVENOUS | Status: AC
  Administered 2021-03-07: 1000 mg via INTRAVENOUS
  Filled 2021-03-07: qty 100

## 2021-03-07 MED ORDER — ACETAMINOPHEN 500 MG PO TABS
1000.0000 mg | ORAL_TABLET | Freq: Once | ORAL | Status: AC
  Administered 2021-03-07: 1000 mg via ORAL
  Filled 2021-03-07: qty 2

## 2021-03-07 MED ORDER — MIDAZOLAM HCL 2 MG/2ML IJ SOLN: 1.0000 mg | Freq: Once | INTRAMUSCULAR | Status: DC

## 2021-03-07 MED ORDER — TRANEXAMIC ACID-NACL 1000-0.7 MG/100ML-% IV SOLN: 1000.0000 mg | Freq: Once | INTRAVENOUS | Status: DC

## 2021-03-07 MED ORDER — POVIDONE-IODINE 10 % EX SWAB
2.0000 "application " | Freq: Once | CUTANEOUS | Status: AC
  Administered 2021-03-07: 2 via TOPICAL

## 2021-03-07 MED ORDER — EPHEDRINE 5 MG/ML INJ
INTRAVENOUS | Status: AC
  Filled 2021-03-07: qty 10

## 2021-03-07 MED ORDER — LIDOCAINE 2% (20 MG/ML) 5 ML SYRINGE
INTRAMUSCULAR | Status: DC | PRN
  Administered 2021-03-07: 60 mg via INTRAVENOUS

## 2021-03-07 MED ORDER — SODIUM CHLORIDE (PF) 0.9 % IJ SOLN
INTRAMUSCULAR | Status: AC
  Filled 2021-03-07: qty 50

## 2021-03-07 MED ORDER — EPHEDRINE SULFATE-NACL 50-0.9 MG/10ML-% IV SOSY
PREFILLED_SYRINGE | INTRAVENOUS | Status: DC | PRN
  Administered 2021-03-07: 7 mg via INTRAVENOUS

## 2021-03-07 MED ORDER — FENTANYL CITRATE (PF) 100 MCG/2ML IJ SOLN
25.0000 ug | INTRAMUSCULAR | Status: DC | PRN
  Administered 2021-03-07 (×3): 25 ug via INTRAVENOUS

## 2021-03-07 MED ORDER — FENTANYL CITRATE (PF) 100 MCG/2ML IJ SOLN
50.0000 ug | Freq: Once | INTRAMUSCULAR | Status: AC
Start: 2021-03-07 — End: 2021-03-07
  Administered 2021-03-07: 50 ug via INTRAVENOUS
  Filled 2021-03-07: qty 2

## 2021-03-07 MED ORDER — FENTANYL CITRATE (PF) 100 MCG/2ML IJ SOLN
INTRAMUSCULAR | Status: DC | PRN
  Administered 2021-03-07 (×6): 25 ug via INTRAVENOUS

## 2021-03-07 MED ORDER — SODIUM CHLORIDE FLUSH 0.9 % IV SOLN
INTRAVENOUS | Status: DC | PRN
  Administered 2021-03-07: 100 mL

## 2021-03-07 MED ORDER — METHOCARBAMOL 500 MG IVPB - SIMPLE MED
INTRAVENOUS | Status: AC
  Filled 2021-03-07: qty 50

## 2021-03-07 MED ORDER — ASPIRIN EC 325 MG PO TBEC
325.0000 mg | DELAYED_RELEASE_TABLET | Freq: Two times a day (BID) | ORAL | 0 refills | Status: DC
Start: 2021-03-07 — End: 2021-07-16

## 2021-03-07 SURGICAL SUPPLY — 52 items
ATTUNE MED DOME PAT 41 KNEE (Knees) ×2 IMPLANT
ATTUNE PS FEM LT SZ 8 CEM KNEE (Femur) ×2 IMPLANT
ATTUNE PSRP INSR SZ8 12 KNEE (Insert) ×2 IMPLANT
BAG ZIPLOCK 12X15 (MISCELLANEOUS) ×2 IMPLANT
BASE TIBIAL ATTUNE KNEE SZ9 (Knees) ×1 IMPLANT
BENZOIN TINCTURE PRP APPL 2/3 (GAUZE/BANDAGES/DRESSINGS) ×2 IMPLANT
BLADE SAGITTAL 25.0X1.19X90 (BLADE) ×2 IMPLANT
BLADE SAW SGTL 13.0X1.19X90.0M (BLADE) ×2 IMPLANT
BLADE SURG SZ10 CARB STEEL (BLADE) ×4 IMPLANT
BNDG ELASTIC 6X10 VLCR STRL LF (GAUZE/BANDAGES/DRESSINGS) ×2 IMPLANT
BNDG ELASTIC 6X5.8 VLCR STR LF (GAUZE/BANDAGES/DRESSINGS) ×2 IMPLANT
BOOTIES KNEE HIGH SLOAN (MISCELLANEOUS) ×2 IMPLANT
BOWL SMART MIX CTS (DISPOSABLE) ×2 IMPLANT
CEMENT HV SMART SET (Cement) ×4 IMPLANT
CLSR STERI-STRIP ANTIMIC 1/2X4 (GAUZE/BANDAGES/DRESSINGS) ×2 IMPLANT
COVER SURGICAL LIGHT HANDLE (MISCELLANEOUS) ×2 IMPLANT
COVER WAND RF STERILE (DRAPES) IMPLANT
CUFF TOURN SGL QUICK 34 (TOURNIQUET CUFF) ×2
CUFF TRNQT CYL 34X4.125X (TOURNIQUET CUFF) ×1 IMPLANT
DRAPE U-SHAPE 47X51 STRL (DRAPES) ×2 IMPLANT
DRSG AQUACEL AG ADV 3.5X10 (GAUZE/BANDAGES/DRESSINGS) ×2 IMPLANT
DURAPREP 26ML APPLICATOR (WOUND CARE) ×2 IMPLANT
ELECT REM PT RETURN 15FT ADLT (MISCELLANEOUS) ×2 IMPLANT
GLOVE SRG 8 PF TXTR STRL LF DI (GLOVE) ×2 IMPLANT
GLOVE SURG ENC MOIS LTX SZ7 (GLOVE) ×2 IMPLANT
GLOVE SURG ENC MOIS LTX SZ7.5 (GLOVE) ×2 IMPLANT
GLOVE SURG LTX SZ7.5 (GLOVE) ×4 IMPLANT
GLOVE SURG UNDER POLY LF SZ7 (GLOVE) ×4 IMPLANT
GLOVE SURG UNDER POLY LF SZ8 (GLOVE) ×4
GOWN STRL REUS W/TWL XL LVL3 (GOWN DISPOSABLE) ×8 IMPLANT
HANDPIECE INTERPULSE COAX TIP (DISPOSABLE) ×2
HOOD PEEL AWAY FLYTE STAYCOOL (MISCELLANEOUS) ×6 IMPLANT
KIT TURNOVER KIT A (KITS) ×2 IMPLANT
MANIFOLD NEPTUNE II (INSTRUMENTS) ×2 IMPLANT
NEEDLE HYPO 22GX1.5 SAFETY (NEEDLE) ×4 IMPLANT
NS IRRIG 1000ML POUR BTL (IV SOLUTION) ×2 IMPLANT
PACK ICE MAXI GEL EZY WRAP (MISCELLANEOUS) ×4 IMPLANT
PACK TOTAL KNEE CUSTOM (KITS) ×2 IMPLANT
PADDING CAST COTTON 6X4 STRL (CAST SUPPLIES) ×2 IMPLANT
PENCIL SMOKE EVACUATOR (MISCELLANEOUS) IMPLANT
PIN DRILL FIX HALF THREAD (BIT) ×2 IMPLANT
PIN STEINMAN FIXATION KNEE (PIN) ×2 IMPLANT
PROTECTOR NERVE ULNAR (MISCELLANEOUS) ×2 IMPLANT
SET HNDPC FAN SPRY TIP SCT (DISPOSABLE) ×1 IMPLANT
STRIP CLOSURE SKIN 1/2X4 (GAUZE/BANDAGES/DRESSINGS) IMPLANT
SUT MNCRL AB 3-0 PS2 18 (SUTURE) ×2 IMPLANT
SUT VIC AB 0 CT1 36 (SUTURE) ×2 IMPLANT
SUT VIC AB 1 CT1 36 (SUTURE) ×4 IMPLANT
SYR CONTROL 10ML LL (SYRINGE) ×4 IMPLANT
TIBIAL BASE ATTUNE KNEE SZ9 (Knees) ×2 IMPLANT
TRAY CATH INTERMITTENT SS 16FR (CATHETERS) ×2 IMPLANT
WATER STERILE IRR 1000ML POUR (IV SOLUTION) ×4 IMPLANT

## 2021-03-07 NOTE — Evaluation (Signed)
Physical Therapy Evaluation Patient Details Name: Justin Mcgee MRN: [id number] DOB: [birth year] Today's Date: 03/07/2021   History of Present Illness  Pt s/p L TKR and with hx of hearing deficits and lumbosacral radicullar neuropathy  Clinical Impression  Pt s/p L TKR and presents with decreased L LE strength/ROM (notably drop foot) and post op pain limiting functional mobility.  Pt currently requiring min assist for balance/support and should progress to dc home with family assist.      Follow Up Recommendations Home health PT    Equipment Recommendations  None recommended by PT    Recommendations for Other Services       Precautions / Restrictions Precautions Precautions: Fall Restrictions Weight Bearing Restrictions: No Other Position/Activity Restrictions: WbAT      Mobility  Bed Mobility Overal bed mobility: Needs Assistance Bed Mobility: Supine to Sit     Supine to sit: Min guard     General bed mobility comments: for balance/safety    Transfers Overall transfer level: Needs assistance Equipment used: Rolling walker (2 wheeled) Transfers: Sit to/from Stand Sit to Stand: Min assist;Mod assist         General transfer comment: Cues for LE management and use of UEs to self assist.  Physical assist to bring wt up and fwd and to balance in standing to attempt use of urinal  Ambulation/Gait Ambulation/Gait assistance: Min assist Gait Distance (Feet): 130 Feet Assistive device: Rolling walker (2 wheeled) Gait Pattern/deviations: Step-to pattern;Decreased step length - right;Decreased step length - left;Shuffle;Trunk flexed Gait velocity: decr   General Gait Details: cues for sequence, posture, position from RW, awareness of drop foot on L  Stairs Stairs: Yes Stairs assistance: Min assist;Mod assist Stair Management: Two rails;Forwards;Step to pattern Number of Stairs: 2 General stair comments: cues for sequence, foot placement, full knee ext on  L  Wheelchair Mobility    Modified Rankin (Stroke Patients Only)       Balance Overall balance assessment: Needs assistance Sitting-balance support: No upper extremity supported;Feet supported Sitting balance-Leahy Scale: Fair     Standing balance support: Bilateral upper extremity supported Standing balance-Leahy Scale: Poor                               Pertinent Vitals/Pain Pain Assessment: 0-10 Pain Score: 3  Pain Location: L knee Pain Descriptors / Indicators: Aching;Sore Pain Intervention(s): Limited activity within patient's tolerance;Monitored during session;Premedicated before session    [place] expects to be discharged to:: Private residence Living Arrangements: Spouse/significant other Available Help at Discharge: Family;Available 24 hours/day Type of Home: House Home Access: Stairs to enter Entrance Stairs-Rails: Right;Left;Can reach both Entrance Stairs-Number of Steps: 5 Home Layout: One level Home Equipment: Walker - 2 wheels;Cane - single point Additional Comments: Pt states son will be assisting initially    Prior Function Level of Independence: Independent               Hand Dominance        Extremity/Trunk Assessment   Upper Extremity Assessment Upper Extremity Assessment: Overall WFL for tasks assessed    Lower Extremity Assessment Lower Extremity Assessment: LLE deficits/detail LLE Deficits / Details: IND SLR but trace only DF at ankle - Dr Berenice Primas in room and states it is related to Exparel and should resolve in the next 36 hrs    Cervical / Trunk Assessment Cervical / Trunk Assessment: Normal  Communication   Communication: HOH  Cognition Arousal/Alertness:  Awake/alert Behavior During Therapy: WFL for tasks assessed/performed Overall Cognitive Status: Within Functional Limits for tasks assessed                                        General Comments      Exercises      Assessment/Plan    PT Assessment Patient needs continued PT services  PT Problem List Decreased strength;Decreased range of motion;Decreased activity tolerance;Decreased balance;Decreased mobility;Decreased knowledge of use of DME;Pain       PT Treatment Interventions DME instruction;Gait training;Stair training;Functional mobility training;Therapeutic activities;Therapeutic exercise;Patient/family education    PT Goals (Current goals can be found in the Care Plan section)  Acute Rehab PT Goals Patient Stated Goal: Regain IND PT Goal Formulation: With patient Time For Goal Achievement: 03/14/21 Potential to Achieve Goals: Good    Frequency 7X/week   Barriers to discharge        Co-evaluation               AM-PAC PT "6 Clicks" Mobility  Outcome Measure Help needed turning from your back to your side while in a flat bed without using bedrails?: A Little Help needed moving from lying on your back to sitting on the side of a flat bed without using bedrails?: A Little Help needed moving to and from a bed to a chair (including a wheelchair)?: A Little Help needed standing up from a chair using your arms (e.g., wheelchair or bedside chair)?: A Lot Help needed to walk in hospital room?: A Little Help needed climbing 3-5 steps with a railing? : A Lot 6 Click Score: 16    End of Session Equipment Utilized During Treatment: Gait belt Activity Tolerance: Patient tolerated treatment well Patient left: in chair;with call bell/phone within reach;with nursing/sitter in room Nurse Communication: Mobility status PT Visit Diagnosis: Unsteadiness on feet (R26.81);Difficulty in walking, not elsewhere classified (R26.2)    Time: 1317-1400 PT Time Calculation (min) (ACUTE ONLY): 43 min   Charges:   PT Evaluation $PT Eval Low Complexity: 1 Low PT Treatments $Gait Training: 8-22 mins        Debe Coder PT Acute Rehabilitation Services Pager [phone] Office  [phone]   Laurance Heide 03/07/2021, 3:33 PM

## 2021-03-07 NOTE — Anesthesia Procedure Notes (Signed)
Procedure Name: LMA Insertion Date/Time: 03/07/2021 8:43 AM Performed by: Garrel Ridgel, CRNA Pre-anesthesia Checklist: Patient identified, Emergency Drugs available, Suction available, Patient being monitored and Timeout performed Patient Re-evaluated:Patient Re-evaluated prior to induction Oxygen Delivery Method: Circle system utilized Preoxygenation: Pre-oxygenation with 100% oxygen Induction Type: IV induction Ventilation: Mask ventilation without difficulty LMA: LMA with gastric port inserted LMA Size: 4.0 Number of attempts: 1 Placement Confirmation: positive ETCO2 Tube secured with: Tape Dental Injury: Teeth and Oropharynx as per pre-operative assessment

## 2021-03-07 NOTE — Anesthesia Procedure Notes (Signed)
Procedure Name: LMA Insertion Date/Time: 03/07/2021 8:44 AM Performed by: West Pugh, CRNA Pre-anesthesia Checklist: Patient identified, Emergency Drugs available, Suction available, Patient being monitored and Timeout performed Patient Re-evaluated:Patient Re-evaluated prior to induction Oxygen Delivery Method: Circle system utilized Preoxygenation: Pre-oxygenation with 100% oxygen Induction Type: IV induction LMA: LMA with gastric port inserted LMA Size: 4.0 Number of attempts: 1 Placement Confirmation: positive ETCO2 Tube secured with: Tape Dental Injury: Teeth and Oropharynx as per pre-operative assessment

## 2021-03-07 NOTE — Progress Notes (Signed)
Physical Therapy Treatment Patient Details Name: Justin Mcgee MRN: [id number] DOB: [birth year] Today's Date: 03/07/2021    History of Present Illness Pt s/p L TKR and with hx of hearing deficits and lumbosacral radicullar neuropathy    PT Comments    Pt up to ambulate with noted improvement in stability and decreased cueing requiring.  Pt negotiated stairs 3+2+3 with bil rails.  Pt confirms that son is will be assisting 24/7 initially.  Pt hopeful for dc home this date but has been unable to void bladder.  Follow Up Recommendations  Home health PT     Equipment Recommendations  None recommended by PT    Recommendations for Other Services       Precautions / Restrictions Precautions Precautions: Fall Restrictions Weight Bearing Restrictions: No Other Position/Activity Restrictions: WbAT    Mobility  Bed Mobility Overal bed mobility: Needs Assistance Bed Mobility: Supine to Sit     Supine to sit: Min guard     General bed mobility comments: Pt up in chair and returns to same    Transfers Overall transfer level: Needs assistance Equipment used: Rolling walker (2 wheeled) Transfers: Sit to/from Stand Sit to Stand: Min guard         General transfer comment: CG steady assist and cues for LE management and use of UEs to self assist.  Ambulation/Gait Ambulation/Gait assistance: Min guard Gait Distance (Feet): 150 Feet Assistive device: Rolling walker (2 wheeled) Gait Pattern/deviations: Step-to pattern;Decreased step length - right;Decreased step length - left;Shuffle;Trunk flexed Gait velocity: decr   General Gait Details: cues for sequence, posture, position from RW, awareness of drop foot on L   Stairs Stairs: Yes Stairs assistance: Min assist Stair Management: Two rails;Forwards;Step to pattern Number of Stairs: 8 General stair comments: cues for sequence, foot placement, full knee ext on L   Wheelchair Mobility    Modified Rankin (Stroke  Patients Only)       Balance Overall balance assessment: Needs assistance Sitting-balance support: No upper extremity supported;Feet supported Sitting balance-Leahy Scale: Fair     Standing balance support: Bilateral upper extremity supported Standing balance-Leahy Scale: Poor                              Cognition Arousal/Alertness: Awake/alert Behavior During Therapy: WFL for tasks assessed/performed Overall Cognitive Status: Within Functional Limits for tasks assessed                                        Exercises      General Comments        Pertinent Vitals/Pain Pain Assessment: 0-10 Pain Score: 3  Pain Location: L knee Pain Descriptors / Indicators: Aching;Sore Pain Intervention(s): Limited activity within patient's tolerance;Monitored during session;Premedicated before session    [place] expects to be discharged to:: Private residence Living Arrangements: Spouse/significant other Available Help at Discharge: Family;Available 24 hours/day Type of Home: House Home Access: Stairs to enter Entrance Stairs-Rails: Right;Left;Can reach both Home Layout: One level Home Equipment: Environmental consultant - 2 wheels;Cane - single point Additional Comments: Pt states son will be assisting initially    Prior Function Level of Independence: Independent          PT Goals (current goals can now be found in the care plan section) Acute Rehab PT Goals Patient Stated Goal: Regain IND PT Goal Formulation: With  patient Time For Goal Achievement: 03/14/21 Potential to Achieve Goals: Good Progress towards PT goals: Progressing toward goals    Frequency    7X/week      PT Plan Current plan remains appropriate    Co-evaluation              AM-PAC PT "6 Clicks" Mobility   Outcome Measure  Help needed turning from your back to your side while in a flat bed without using bedrails?: A Little Help needed moving from lying on  your back to sitting on the side of a flat bed without using bedrails?: A Little Help needed moving to and from a bed to a chair (including a wheelchair)?: A Little Help needed standing up from a chair using your arms (e.g., wheelchair or bedside chair)?: A Little Help needed to walk in hospital room?: A Little Help needed climbing 3-5 steps with a railing? : A Little 6 Click Score: 18    End of Session Equipment Utilized During Treatment: Gait belt Activity Tolerance: Patient tolerated treatment well Patient left: in chair;with call bell/phone within reach;with nursing/sitter in room Nurse Communication: Mobility status PT Visit Diagnosis: Unsteadiness on feet (R26.81);Difficulty in walking, not elsewhere classified (R26.2)     Time: 1423-9532 PT Time Calculation (min) (ACUTE ONLY): 22 min  Charges:  $Gait Training: 8-22 mins                     [place] Pager [phone] Office [phone]    Sindy Mccune 03/07/2021, 3:40 PM

## 2021-03-07 NOTE — Interval H&P Note (Signed)
History and Physical Interval Note:  03/07/2021 7:16 AM  Justin Mcgee  has presented today for surgery, with the diagnosis of LEFT KNEE DEGENERATIVE JOINT DISEASE.  The various methods of treatment have been discussed with the patient and family. After consideration of risks, benefits and other options for treatment, the patient has consented to  Procedure(s): TOTAL KNEE ARTHROPLASTY (Left) as a surgical intervention.  The patient's history has been reviewed, patient examined, no change in status, stable for surgery.  I have reviewed the patient's chart and labs.  Questions were answered to the patient's satisfaction.     Alta Corning

## 2021-03-07 NOTE — Transfer of Care (Signed)
Immediate Anesthesia Transfer of Care Note  Patient: Justin Mcgee  Procedure(s) Performed: TOTAL KNEE ARTHROPLASTY (Left Knee)  Patient Location: PACU  Anesthesia Type:General  Level of Consciousness: oriented, drowsy and patient cooperative  Airway & Oxygen Therapy: Patient Spontanous Breathing and Patient connected to face mask oxygen  Post-op Assessment: Report given to RN and Post -op Vital signs reviewed and stable  Post vital signs: Reviewed and stable  Last Vitals:  Vitals Value Taken Time  BP 113/87 03/07/21 1047  Temp    Pulse 71 03/07/21 1049  Resp 7 03/07/21 1049  SpO2 100 % 03/07/21 1049  Vitals shown include unvalidated device data.  Last Pain:  Vitals:   03/07/21 0643  TempSrc: Oral  PainSc: 0-No pain         Complications: No complications documented.

## 2021-03-07 NOTE — Anesthesia Procedure Notes (Signed)
Anesthesia Regional Block: Adductor canal block   Pre-Anesthetic Checklist: ,, timeout performed, Correct Patient, Correct Site, Correct Laterality, Correct Procedure, Correct Position, site marked, Risks and benefits discussed,  Surgical consent,  Pre-op evaluation,  At surgeon's request and post-op pain management  Laterality: Left  Prep: chloraprep       Needles:  Injection technique: Single-shot  Needle Type: Echogenic Needle     Needle Length: 9cm  Needle Gauge: 21     Additional Needles:   Procedures:,,,, ultrasound used (permanent image in chart),,,,  Narrative:  Start time: 03/07/2021 7:40 AM End time: 03/07/2021 7:45 AM Injection made incrementally with aspirations every 5 mL.  Performed by: Personally  Anesthesiologist: Suzette Battiest, MD

## 2021-03-07 NOTE — Anesthesia Postprocedure Evaluation (Signed)
Anesthesia Post Note  Patient: Justin Mcgee  Procedure(s) Performed: TOTAL KNEE ARTHROPLASTY (Left Knee)     Patient location during evaluation: PACU Anesthesia Type: General Level of consciousness: awake and alert Pain management: pain level controlled Vital Signs Assessment: post-procedure vital signs reviewed and stable Respiratory status: spontaneous breathing, nonlabored ventilation, respiratory function stable and patient connected to nasal cannula oxygen Cardiovascular status: blood pressure returned to baseline and stable Postop Assessment: no apparent nausea or vomiting Anesthetic complications: no   No complications documented.  Last Vitals:  Vitals:   03/07/21 1300 03/07/21 1400  BP: 125/76   Pulse: 68   Resp: 16 16  Temp:    SpO2: 94%     Last Pain:  Vitals:   03/07/21 1300  TempSrc:   PainSc: 3                  Tiajuana Amass

## 2021-03-07 NOTE — Discharge Instructions (Signed)

## 2021-03-07 NOTE — Anesthesia Procedure Notes (Deleted)
Spinal  Patient location during procedure: OR Start time: 03/07/2021 7:40 AM End time: 03/07/2021 7:45 AM Reason for block: surgical anesthesia Staffing Performed: anesthesiologist  Anesthesiologist: Suzette Battiest, MD Preanesthetic Checklist Completed: patient identified, IV checked, site marked, risks and benefits discussed, surgical consent, monitors and equipment checked, pre-op evaluation and timeout performed Spinal Block Patient position: sitting Prep: DuraPrep Patient monitoring: heart rate, cardiac monitor, continuous pulse ox and blood pressure Approach: midline Location: L4-5 Injection technique: single-shot Needle Needle type: Pencan  Needle gauge: 24 G Needle length: 9 cm Assessment Sensory level: T4 Events: CSF return

## 2021-03-07 NOTE — Op Note (Signed)
PATIENT ID:      Justin Mcgee  MRN:     [id number] DOB/AGE:    Dec 14, 1934 / 86 y.o.       OPERATIVE REPORT   DATE OF PROCEDURE:  03/07/2021      PREOPERATIVE DIAGNOSIS:   LEFT KNEE DEGENERATIVE JOINT DISEASE      Estimated body mass index is 22.12 kg/m as calculated from the following:   Height as of 02/28/21: 6\' 3"  (1.905 m).   Weight as of this encounter: 80.3 kg.                                                       POSTOPERATIVE DIAGNOSIS:   Same                                                                  PROCEDURE:  Procedure(s): TOTAL KNEE ARTHROPLASTY Using DepuyAttune RP implants #8 Femur, #9Tibia, 12 mm Attune RP bearing, 41 Patella    SURGEON: Daanish Copes Maudie Mercury  ASSISTANT:   Jim [email or web address]   (Present and scrubbed throughout the case, critical for assistance with exposure, retraction, instrumentation, and closure.)        ANESTHESIA: spinal, 20cc Exparel, 50cc 0.25% Marcaine EBL: min cc FLUID REPLACEMENT: unk cc crystaloid TOURNIQUET: DRAINS: None TRANEXAMIC ACID: 1gm IV, 2gm topical COMPLICATIONS:  None         INDICATIONS FOR PROCEDURE: The patient has  LEFT KNEE DEGENERATIVE JOINT DISEASE, varus deformities, XR shows bone on bone arthritis, lateral subluxation of tibia. Patient has failed all conservative measures including anti-inflammatory medicines, narcotics, attempts at exercise and weight loss, cortisone injections and viscosupplementation.  Risks and benefits of surgery have been discussed, questions answered.   DESCRIPTION OF PROCEDURE: The patient identified by armband, received  IV antibiotics, in the holding area at [facility] Patient taken to the operating room, appropriate anesthetic monitors were attached, and spinal anesthesia was  induced. IV Tranexamic acid was given.Tourniquet applied high to the operative thigh. Lateral post and foot positioner applied to the table, the lower extremity was then prepped and draped in usual sterile fashion from  the toes to the tourniquet. Time-out procedure was performed. Gaspar Skeeters PAC, was present and scrubbed throughout the case, critical for assistance with, positioning, exposure, retraction, instrumentation, and closure.The skin and subcutaneous tissue along the incision was injected with 20 cc of a mixture of Exparel and Marcaine solution, using a 20-gauge by 1-1/2 inch needle. We began the operation, with the knee flexed 130 degrees, by making the anterior midline incision starting at handbreadth above the patella going over the patella 1 cm medial to and 4 cm distal to the tibial tubercle. Small bleeders in the skin and the subcutaneous tissue identified and cauterized. Transverse retinaculum was incised and reflected medially and a medial parapatellar arthrotomy was accomplished. the patella was everted and theprepatellar fat pad resected. The superficial medial collateral ligament was then elevated from anterior to posterior along the proximal flare of the tibia and anterior half of the menisci resected. The knee was hyperflexed exposing bone on bone arthritis. Peripheral and  notch osteophytes as well as the cruciate ligaments were then resected. We continued to work our way around posteriorly along the proximal tibia, and externally rotated the tibia subluxing it out from underneath the femur. A McHale PCL retractor was placed through the notch and a lateral Hohmann retractor placed, and we then entered the proximal tibia in line with the Depuy starter drill in line with the axis of the tibia followed by an intramedullary guide rod and 0-degree posterior slope cutting guide. The tibial cutting guide, 4 degree posterior sloped, was pinned into place allowing resection of 2 mm of bone medially and 10 mm of bone laterally. Satisfied with the tibial resection, we then entered the distal femur 2 mm anterior to the PCL origin with the intramedullary guide rod and applied the distal femoral cutting guide set at 9 mm,  with 5 degrees of valgus. This was pinned along the epicondylar axis. At this point, the distal femoral cut was accomplished without difficulty. We then sized for a #8 femoral component and pinned the guide in 3 degrees of external rotation. The chamfer cutting guide was pinned into place. The anterior, posterior, and chamfer cuts were accomplished without difficulty followed by the Attune RP box cutting guide and the box cut. We also removed posterior osteophytes from the posterior femoral condyles. The posterior capsule was injected with Exparel solution. The knee was brought into full extension. We checked our extension gap and fit a 12 mm bearing. Distracting in extension with a lamina spreader,  bleeders in the posterior capsule, Posterior medial and posterior lateral gutter were cauterized.  The transexamic acid-soaked sponge was then placed in the gap of the knee in extension. The knee was flexed 30. The posterior patella cut was accomplished with the 9.5 mm Attune cutting guide, sized for a 13mm dome, and the fixation pegs drilled.The knee was then once again hyperflexed exposing the proximal tibia. We sized for a # 9 tibial base plate, applied the smokestack and the conical reamer followed by the the Delta fin keel punch. We then hammered into place the Attune RP trial femoral component, drilled the lugs, inserted a  12 mm trial bearing, trial patellar button, and took the knee through range of motion from 0-130 degrees. Medial and lateral ligamentous stability was checked. No thumb pressure was required for patellar Tracking. The tourniquet was released @45  min. All trial components were removed, mating surfaces irrigated with pulse lavage, and dried with suction and sponges. 10 cc of the Exparel solution was applied to the cancellus bone of the patella distal femur and proximal tibia.  After waiting 30 seconds, the bony surfaces were again, dried with sponges. A double batch of DePuy HV cement was mixed  and applied to all bony metallic mating surfaces except for the posterior condyles of the femur itself. In order, we hammered into place the tibial tray and removed excess cement, the femoral component and removed excess cement. The final Attune RP bearing was inserted, and the knee brought to full extension with compression. The patellar button was clamped into place, and excess cement removed. The knee was held at 30 flexion with compression, while the cement cured. The wound was irrigated out with normal saline solution pulse lavage. The rest of the Exparel was injected into the parapatellar arthrotomy, subcutaneous tissues, and periosteal tissues. The parapatellar arthrotomy was closed with running #1 Vicryl suture. The subcutaneous tissue with 3-0 undyed Vicryl suture, and the skin with running 3-0 SQ vicryl. An Aquacil and  Ace wrap were applied. The patient was taken to recovery room without difficulty.   Alta Corning 03/07/2021, 10:16 AM

## 2021-03-07 NOTE — Progress Notes (Signed)
Assisted Dr. Suzette Battiest with Left Knee Adductor Canal  block. Side rails up, monitors on throughout procedure. See vital signs in flow sheet. Tolerated Procedure well.

## 2021-03-09 DIAGNOSIS — Z96652 Presence of left artificial knee joint: Secondary | ICD-10-CM | POA: Diagnosis not present

## 2021-03-09 DIAGNOSIS — R7301 Impaired fasting glucose: Secondary | ICD-10-CM | POA: Diagnosis not present

## 2021-03-09 DIAGNOSIS — I471 Supraventricular tachycardia: Secondary | ICD-10-CM | POA: Diagnosis not present

## 2021-03-09 DIAGNOSIS — N183 Chronic kidney disease, stage 3 unspecified: Secondary | ICD-10-CM | POA: Diagnosis not present

## 2021-03-09 DIAGNOSIS — Z8616 Personal history of COVID-19: Secondary | ICD-10-CM | POA: Diagnosis not present

## 2021-03-09 DIAGNOSIS — Z9049 Acquired absence of other specified parts of digestive tract: Secondary | ICD-10-CM | POA: Diagnosis not present

## 2021-03-09 DIAGNOSIS — M5417 Radiculopathy, lumbosacral region: Secondary | ICD-10-CM | POA: Diagnosis not present

## 2021-03-09 DIAGNOSIS — K5900 Constipation, unspecified: Secondary | ICD-10-CM | POA: Diagnosis not present

## 2021-03-09 DIAGNOSIS — M19012 Primary osteoarthritis, left shoulder: Secondary | ICD-10-CM | POA: Diagnosis not present

## 2021-03-09 DIAGNOSIS — Z471 Aftercare following joint replacement surgery: Secondary | ICD-10-CM | POA: Diagnosis not present

## 2021-03-09 DIAGNOSIS — K219 Gastro-esophageal reflux disease without esophagitis: Secondary | ICD-10-CM | POA: Diagnosis not present

## 2021-03-09 DIAGNOSIS — M1711 Unilateral primary osteoarthritis, right knee: Secondary | ICD-10-CM | POA: Diagnosis not present

## 2021-03-09 DIAGNOSIS — L308 Other specified dermatitis: Secondary | ICD-10-CM | POA: Diagnosis not present

## 2021-03-09 DIAGNOSIS — Z9089 Acquired absence of other organs: Secondary | ICD-10-CM | POA: Diagnosis not present

## 2021-03-09 DIAGNOSIS — Z87891 Personal history of nicotine dependence: Secondary | ICD-10-CM | POA: Diagnosis not present

## 2021-03-09 DIAGNOSIS — Z7982 Long term (current) use of aspirin: Secondary | ICD-10-CM | POA: Diagnosis not present

## 2021-03-09 DIAGNOSIS — D631 Anemia in chronic kidney disease: Secondary | ICD-10-CM | POA: Diagnosis not present

## 2021-03-09 DIAGNOSIS — N400 Enlarged prostate without lower urinary tract symptoms: Secondary | ICD-10-CM | POA: Diagnosis not present

## 2021-03-09 DIAGNOSIS — Z85828 Personal history of other malignant neoplasm of skin: Secondary | ICD-10-CM | POA: Diagnosis not present

## 2021-03-11 ENCOUNTER — Encounter (HOSPITAL_COMMUNITY): Payer: Self-pay | Admitting: Orthopedic Surgery

## 2021-03-11 DIAGNOSIS — N183 Chronic kidney disease, stage 3 unspecified: Secondary | ICD-10-CM | POA: Diagnosis not present

## 2021-03-11 DIAGNOSIS — D631 Anemia in chronic kidney disease: Secondary | ICD-10-CM | POA: Diagnosis not present

## 2021-03-11 DIAGNOSIS — Z471 Aftercare following joint replacement surgery: Secondary | ICD-10-CM | POA: Diagnosis not present

## 2021-03-11 DIAGNOSIS — M1711 Unilateral primary osteoarthritis, right knee: Secondary | ICD-10-CM | POA: Diagnosis not present

## 2021-03-11 DIAGNOSIS — M19012 Primary osteoarthritis, left shoulder: Secondary | ICD-10-CM | POA: Diagnosis not present

## 2021-03-11 DIAGNOSIS — M5417 Radiculopathy, lumbosacral region: Secondary | ICD-10-CM | POA: Diagnosis not present

## 2021-03-13 DIAGNOSIS — N183 Chronic kidney disease, stage 3 unspecified: Secondary | ICD-10-CM | POA: Diagnosis not present

## 2021-03-13 DIAGNOSIS — D631 Anemia in chronic kidney disease: Secondary | ICD-10-CM | POA: Diagnosis not present

## 2021-03-13 DIAGNOSIS — M5417 Radiculopathy, lumbosacral region: Secondary | ICD-10-CM | POA: Diagnosis not present

## 2021-03-13 DIAGNOSIS — M1711 Unilateral primary osteoarthritis, right knee: Secondary | ICD-10-CM | POA: Diagnosis not present

## 2021-03-13 DIAGNOSIS — M19012 Primary osteoarthritis, left shoulder: Secondary | ICD-10-CM | POA: Diagnosis not present

## 2021-03-13 DIAGNOSIS — Z471 Aftercare following joint replacement surgery: Secondary | ICD-10-CM | POA: Diagnosis not present

## 2021-03-17 DIAGNOSIS — N183 Chronic kidney disease, stage 3 unspecified: Secondary | ICD-10-CM | POA: Diagnosis not present

## 2021-03-17 DIAGNOSIS — D631 Anemia in chronic kidney disease: Secondary | ICD-10-CM | POA: Diagnosis not present

## 2021-03-17 DIAGNOSIS — M5417 Radiculopathy, lumbosacral region: Secondary | ICD-10-CM | POA: Diagnosis not present

## 2021-03-17 DIAGNOSIS — Z471 Aftercare following joint replacement surgery: Secondary | ICD-10-CM | POA: Diagnosis not present

## 2021-03-17 DIAGNOSIS — M19012 Primary osteoarthritis, left shoulder: Secondary | ICD-10-CM | POA: Diagnosis not present

## 2021-03-17 DIAGNOSIS — M1711 Unilateral primary osteoarthritis, right knee: Secondary | ICD-10-CM | POA: Diagnosis not present

## 2021-03-19 DIAGNOSIS — M19012 Primary osteoarthritis, left shoulder: Secondary | ICD-10-CM | POA: Diagnosis not present

## 2021-03-19 DIAGNOSIS — D631 Anemia in chronic kidney disease: Secondary | ICD-10-CM | POA: Diagnosis not present

## 2021-03-19 DIAGNOSIS — N183 Chronic kidney disease, stage 3 unspecified: Secondary | ICD-10-CM | POA: Diagnosis not present

## 2021-03-19 DIAGNOSIS — M1711 Unilateral primary osteoarthritis, right knee: Secondary | ICD-10-CM | POA: Diagnosis not present

## 2021-03-19 DIAGNOSIS — Z471 Aftercare following joint replacement surgery: Secondary | ICD-10-CM | POA: Diagnosis not present

## 2021-03-19 DIAGNOSIS — M5417 Radiculopathy, lumbosacral region: Secondary | ICD-10-CM | POA: Diagnosis not present

## 2021-03-20 DIAGNOSIS — Z96652 Presence of left artificial knee joint: Secondary | ICD-10-CM | POA: Diagnosis not present

## 2021-03-20 DIAGNOSIS — Z471 Aftercare following joint replacement surgery: Secondary | ICD-10-CM | POA: Diagnosis not present

## 2021-03-20 DIAGNOSIS — M6281 Muscle weakness (generalized): Secondary | ICD-10-CM | POA: Diagnosis not present

## 2021-03-24 DIAGNOSIS — M6281 Muscle weakness (generalized): Secondary | ICD-10-CM | POA: Diagnosis not present

## 2021-03-24 DIAGNOSIS — Z96652 Presence of left artificial knee joint: Secondary | ICD-10-CM | POA: Diagnosis not present

## 2021-03-26 DIAGNOSIS — M6281 Muscle weakness (generalized): Secondary | ICD-10-CM | POA: Diagnosis not present

## 2021-03-26 DIAGNOSIS — Z96652 Presence of left artificial knee joint: Secondary | ICD-10-CM | POA: Diagnosis not present

## 2021-03-31 DIAGNOSIS — M6281 Muscle weakness (generalized): Secondary | ICD-10-CM | POA: Diagnosis not present

## 2021-03-31 DIAGNOSIS — Z96652 Presence of left artificial knee joint: Secondary | ICD-10-CM | POA: Diagnosis not present

## 2021-04-02 DIAGNOSIS — M6281 Muscle weakness (generalized): Secondary | ICD-10-CM | POA: Diagnosis not present

## 2021-04-02 DIAGNOSIS — Z96652 Presence of left artificial knee joint: Secondary | ICD-10-CM | POA: Diagnosis not present

## 2021-04-07 DIAGNOSIS — M6281 Muscle weakness (generalized): Secondary | ICD-10-CM | POA: Diagnosis not present

## 2021-04-07 DIAGNOSIS — Z96652 Presence of left artificial knee joint: Secondary | ICD-10-CM | POA: Diagnosis not present

## 2021-04-09 DIAGNOSIS — Z96652 Presence of left artificial knee joint: Secondary | ICD-10-CM | POA: Diagnosis not present

## 2021-04-09 DIAGNOSIS — M6281 Muscle weakness (generalized): Secondary | ICD-10-CM | POA: Diagnosis not present

## 2021-04-15 DIAGNOSIS — M6281 Muscle weakness (generalized): Secondary | ICD-10-CM | POA: Diagnosis not present

## 2021-04-15 DIAGNOSIS — Z96652 Presence of left artificial knee joint: Secondary | ICD-10-CM | POA: Diagnosis not present

## 2021-04-18 DIAGNOSIS — M6281 Muscle weakness (generalized): Secondary | ICD-10-CM | POA: Diagnosis not present

## 2021-04-18 DIAGNOSIS — Z96652 Presence of left artificial knee joint: Secondary | ICD-10-CM | POA: Diagnosis not present

## 2021-05-14 IMAGING — DX DG CHEST 1V PORT
1 series · 1 of 1 positions shown · non-contrast
Comparison: Chest x-ray dated 11/30/2008.

CLINICAL DATA: Shortness of breath.  COVID exposure.

EXAM:
PORTABLE CHEST 1 VIEW

[chest ap]
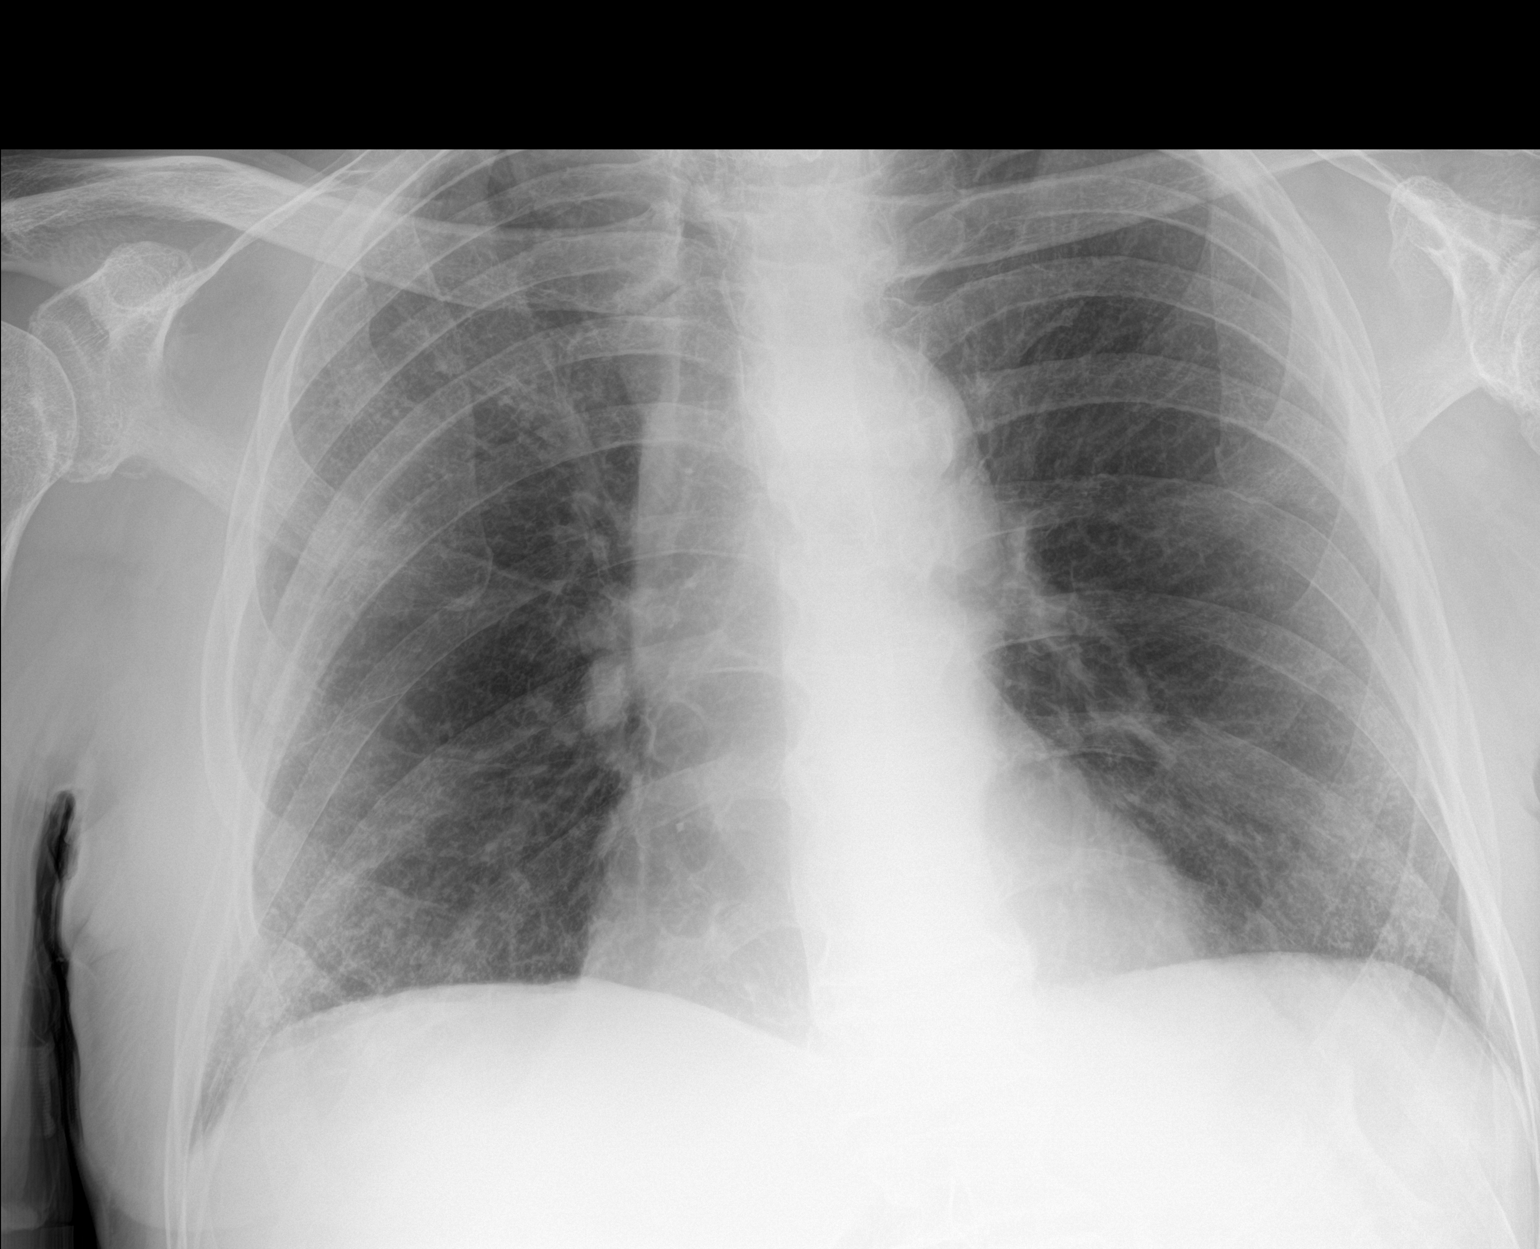

[1 of 1 positions shown; findings below may reference images not displayed]

FINDINGS: Heart size and mediastinal contours are within normal limits. Coarse
interstitial lung markings are seen bilaterally, new and suspicious
for atypical pneumonia. No confluent opacity to suggest a
consolidating pneumonia. No pleural effusion or pneumothorax is
seen. Osseous structures about the chest are unremarkable.
IMPRESSION: 1. Coarse interstitial lung markings bilaterally, RIGHT greater than
LEFT, compatible with chronic interstitial lung disease versus
atypical pneumonia. Differential includes atypical pneumonias such
as viral or fungal, interstitial pneumonias, edema related to volume
overload/CHF, chronic interstitial diseases, hypersensitivity
pneumonitis, and respiratory bronchiolitis. [id number] pneumonia can
have this appearance.
2. No evidence of consolidating pneumonia.
3. Heart size is normal.

## 2021-06-17 IMAGING — DX DG CHEST 2V
2 series · 2 of 2 positions shown · non-contrast
Comparison: November 30, 2008 and January 25, 2021

CLINICAL DATA: Preoperative study prior to knee arthroplasty.

EXAM:
CHEST - 2 VIEW

[chest pa]
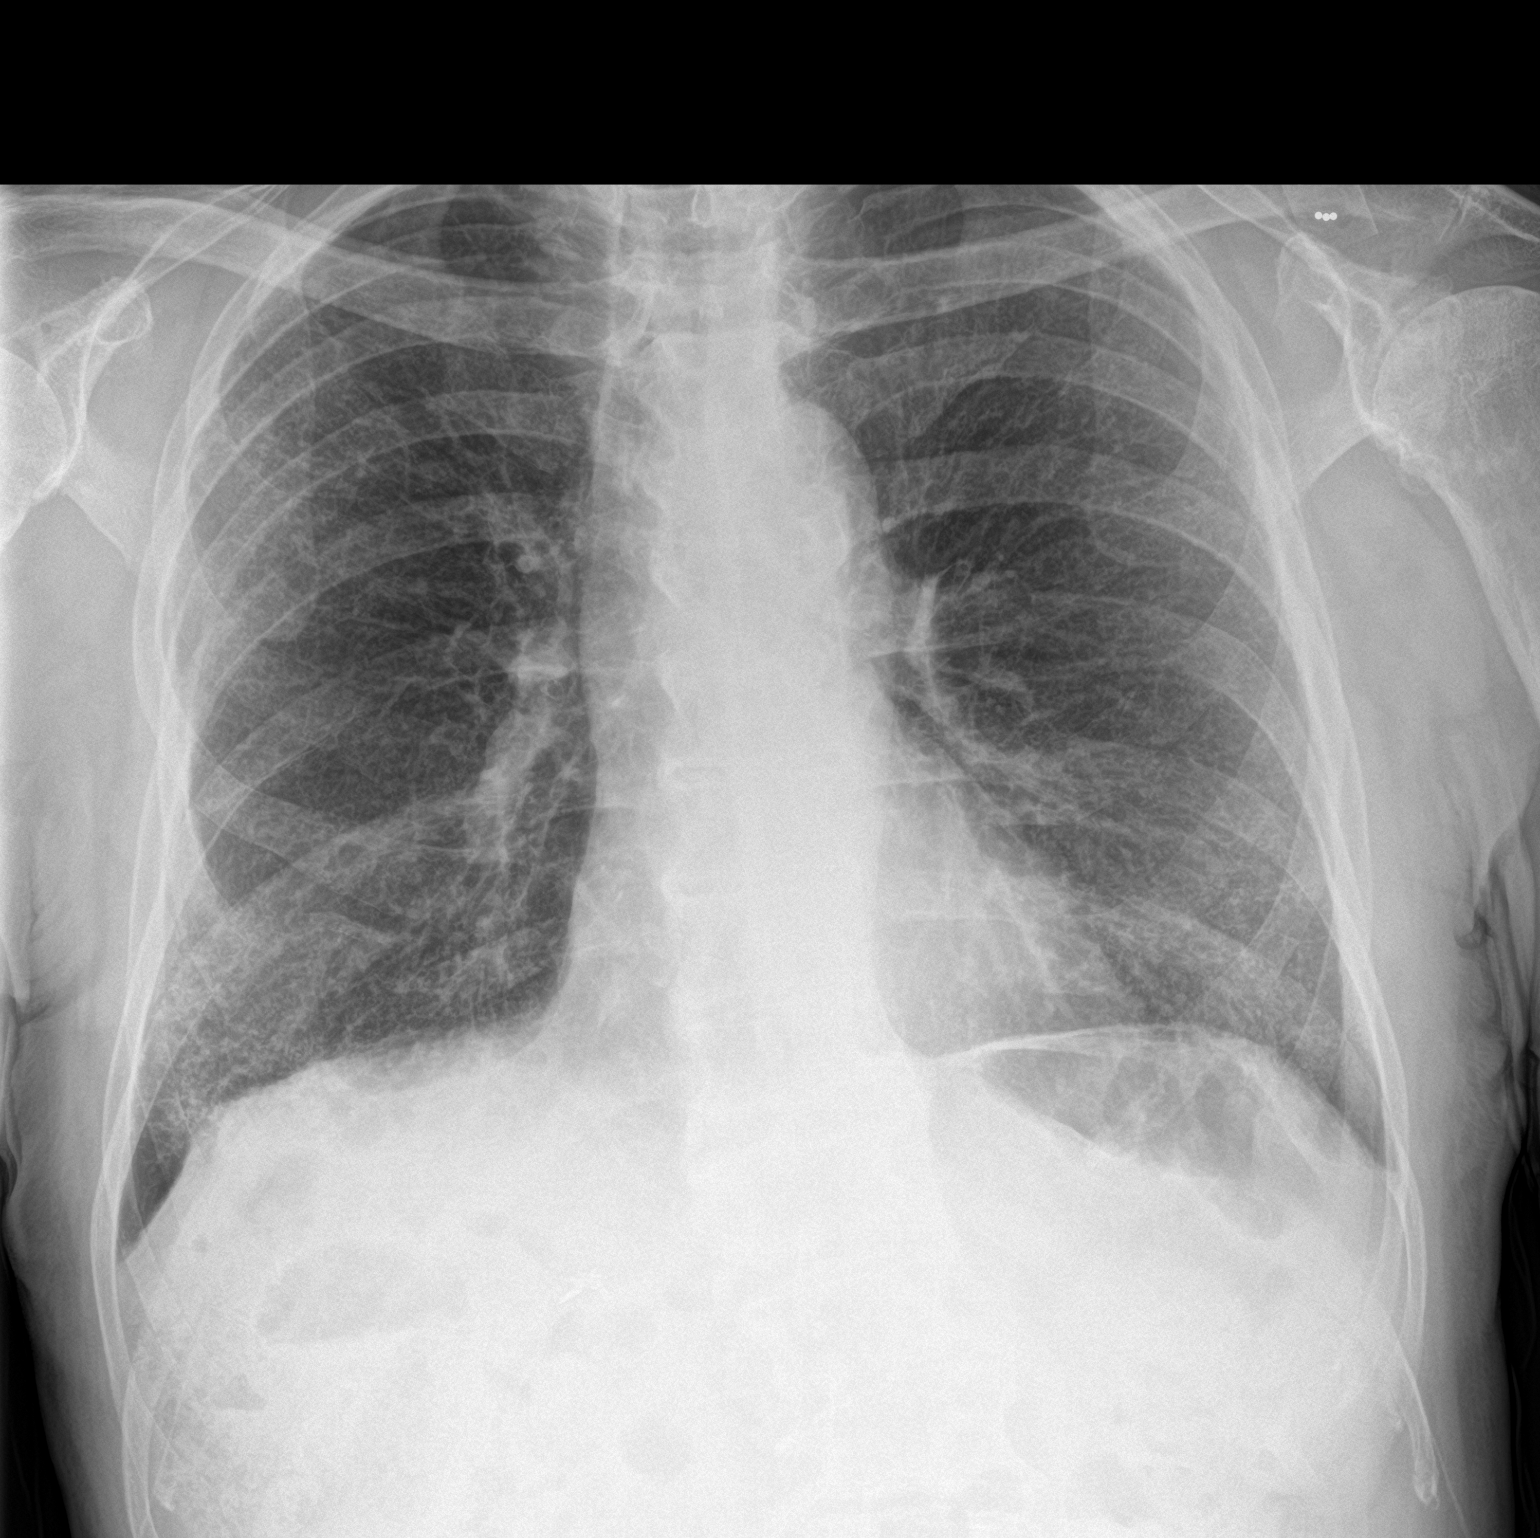

[chest lat]
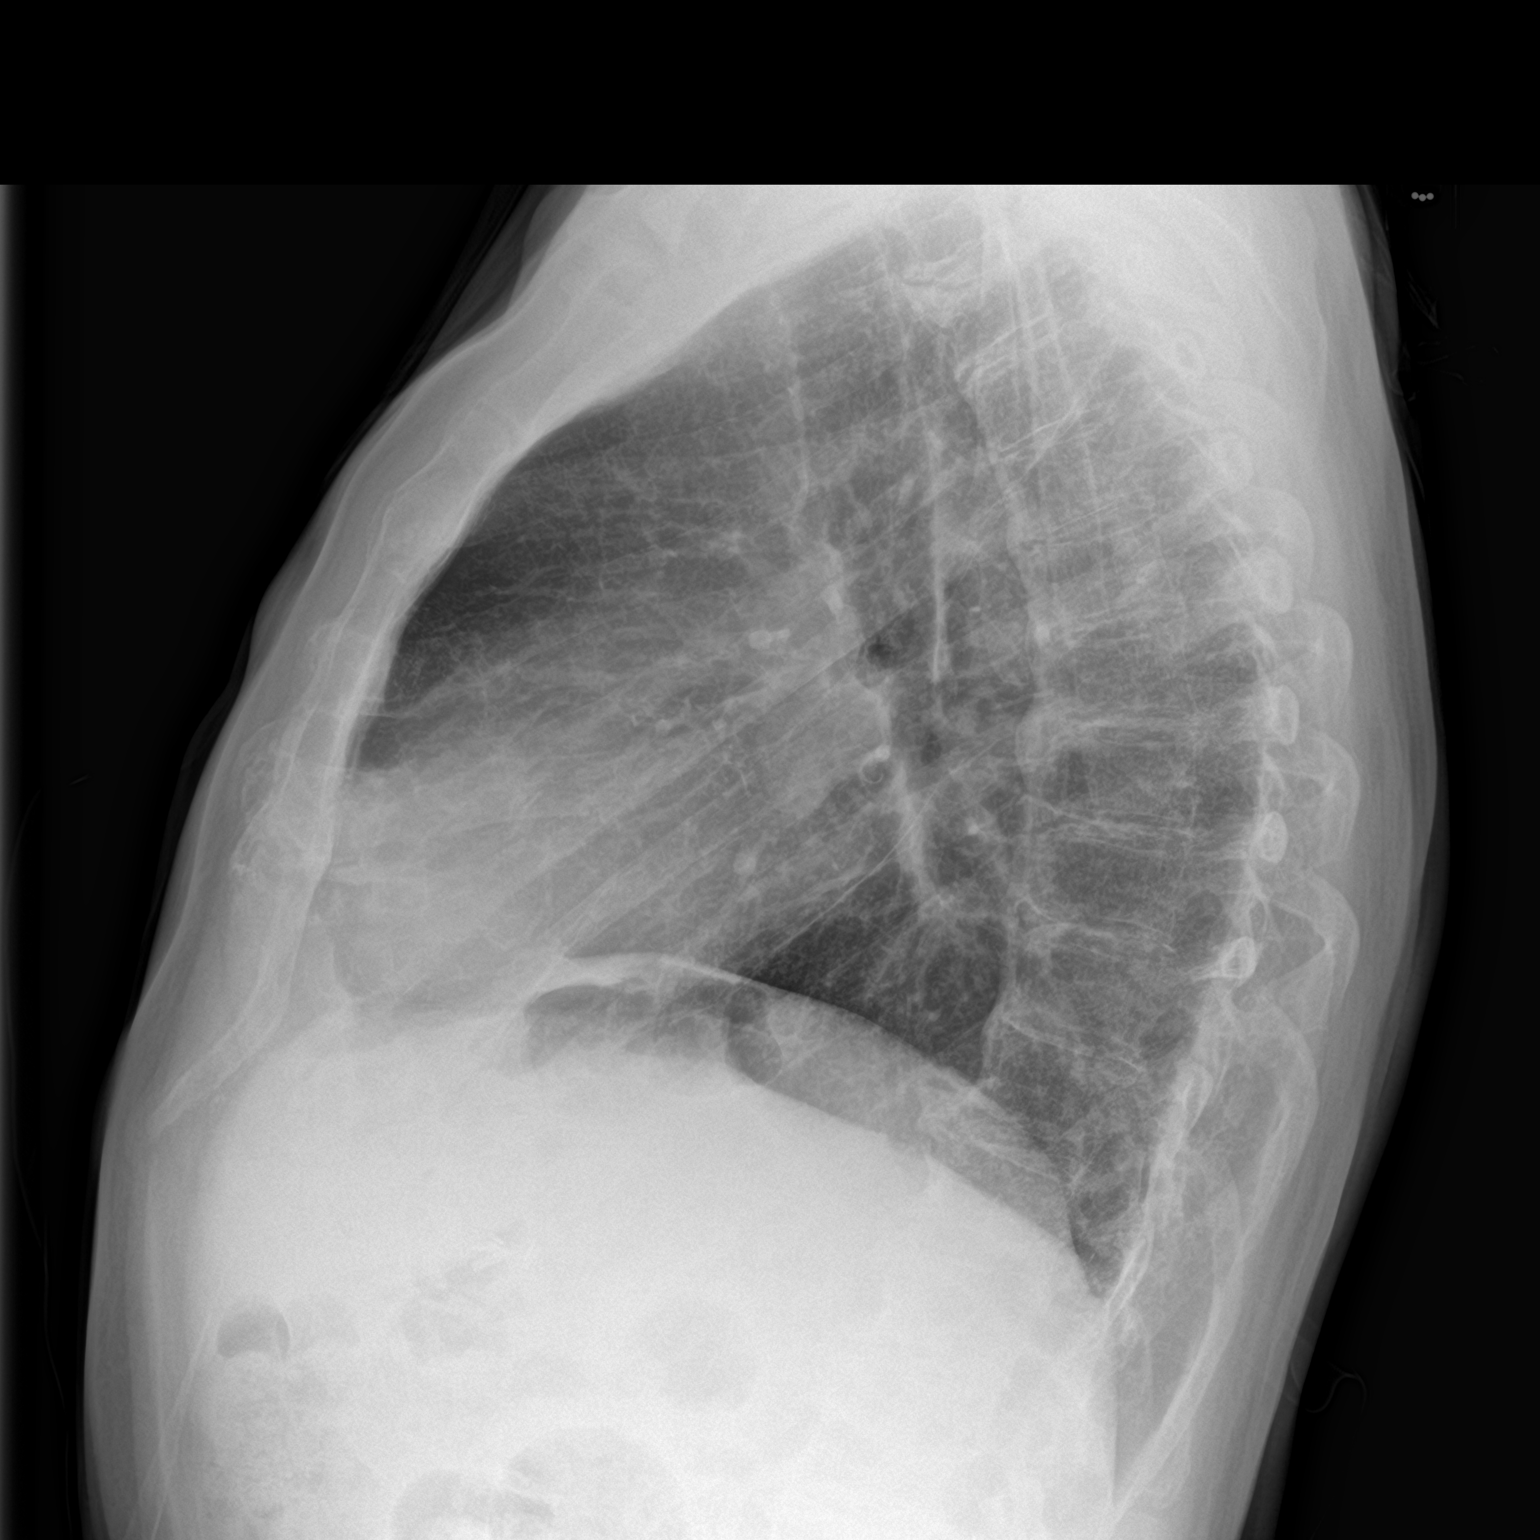

[2 of 2 positions shown; findings below may reference images not displayed]

FINDINGS: Heart, hila, mediastinum are normal. Coarse interstitial lung
markings are again seen by of laterally. The findings are mildly
more pronounced compared January 25, 2021 but the difference could be
technical. No entirely new infiltrates. No masses or nodules. No
pneumothorax.
IMPRESSION: 1. Coarse interstitial lung markings, right greater than left. The
findings are mildly more conspicuous since January 25, 2021 but the
difference may be technical. Differential considerations include
atypical infection, interstitial pneumonias, chronic interstitial
lung disease, hypersensitivity pneumonitis, and respiratory
bronchiolitis. Recommend clinical correlation. CT imaging could
better evaluate if clinically warranted.

These results will be called to the ordering clinician or
representative by the Radiologist Assistant, and communication
documented in the PACS or [REDACTED].

## 2021-07-07 ENCOUNTER — Ambulatory Visit: Payer: Medicare Other | Admitting: Family Medicine

## 2021-07-10 ENCOUNTER — Telehealth: Payer: Self-pay

## 2021-07-10 NOTE — Telephone Encounter (Signed)
Patient wanted to see PCP only, scheduled in next available slot on Wednesday, October 5th, at 8 am. AM

## 2021-07-10 NOTE — Telephone Encounter (Signed)
Please call and schedule patient with next available Provider concerning back pain.   Thanks

## 2021-07-16 ENCOUNTER — Ambulatory Visit (INDEPENDENT_AMBULATORY_CARE_PROVIDER_SITE_OTHER): Payer: Medicare Other

## 2021-07-16 ENCOUNTER — Encounter: Payer: Self-pay | Admitting: Family Medicine

## 2021-07-16 ENCOUNTER — Ambulatory Visit (INDEPENDENT_AMBULATORY_CARE_PROVIDER_SITE_OTHER): Payer: Medicare Other | Admitting: Family Medicine

## 2021-07-16 ENCOUNTER — Other Ambulatory Visit: Payer: Self-pay

## 2021-07-16 VITALS — BP 154/68 | HR 69 | Temp 97.0°F | Ht 75.0 in | Wt 181.0 lb

## 2021-07-16 DIAGNOSIS — M5417 Radiculopathy, lumbosacral region: Secondary | ICD-10-CM

## 2021-07-16 DIAGNOSIS — Z23 Encounter for immunization: Secondary | ICD-10-CM | POA: Diagnosis not present

## 2021-07-16 DIAGNOSIS — M545 Low back pain, unspecified: Secondary | ICD-10-CM | POA: Diagnosis not present

## 2021-07-16 MED ORDER — METHYLPREDNISOLONE 4 MG PO TBPK: ORAL_TABLET | ORAL | 0 refills | Status: DC

## 2021-07-16 MED ORDER — PANTOPRAZOLE SODIUM 40 MG PO TBEC: 40.0000 mg | DELAYED_RELEASE_TABLET | Freq: Two times a day (BID) | ORAL | 4 refills | Status: DC

## 2021-07-16 MED ORDER — PREGABALIN 100 MG PO CAPS: 100.0000 mg | ORAL_CAPSULE | Freq: Two times a day (BID) | ORAL | 0 refills | Status: DC

## 2021-07-16 NOTE — Assessment & Plan Note (Signed)
Acute on chronic low back pain.  Updated imaging ordered .  Start Medrol Dosepak.  Continue Celebrex and Lyrica.  Red flags reviewed with him.

## 2021-07-16 NOTE — Progress Notes (Signed)
Justin Mcgee - 86 y.o. male MRN [id number]  Date of birth: 1935-01-17  Subjective Chief Complaint  Patient presents with   Back Pain    HPI Justin Mcgee is an 86 year old male here today with complaint of low back pain.  He has history of low back pain with radiculopathy which he takes Lyrica and Celebrex for.  He has had acute worsening over the past 2 weeks.  He denies increased radicular pain.  There is no weakness in his legs.  He denies any urinary symptoms or bowel or bladder incontinence.  ROS:  A comprehensive ROS was completed and negative except as noted per HPI  Allergies  Allergen Reactions   Flomax [Tamsulosin Hcl] Other (See Comments)    Dizziness   Gabapentin Other (See Comments)    Unsteadiness and Balance problems.     Other Other (See Comments)    Topical sulfa only. Gets skin irritation.   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 Marica Otter, no radiation or chemo, clear LN.     Past Surgical History:  Procedure Laterality Date   APPENDECTOMY  1997   CATARACT EXTRACTION     CHOLECYSTECTOMY  05/16/10   in [place] while on vacation   [place] Left 1994   PARTIAL GLOSSECTOMY     with unilat neck dissection   TOTAL KNEE ARTHROPLASTY Left 03/07/2021   Procedure: TOTAL KNEE ARTHROPLASTY;  Surgeon: Dorna Leitz, MD;  Location: [facility];  Service: Orthopedics;  Laterality: Left;    Social History   Socioeconomic History   Marital status: Married    Spouse name: Alvy Alsop    Number of children: 2   Years of education: 14   Highest education level: Some college, no degree  Occupational History   Occupation: Electrical engineer: [employer]  Tobacco Use   Smoking status: Former    Types: Cigarettes    Quit date: 02/19/1978    Years since quitting: 43.4   Smokeless tobacco: Never  Vaping Use   Vaping Use: Never used  Substance and Sexual Activity   Alcohol use: No    Alcohol/week: 0.0 standard drinks   Drug use: No   Sexual activity: Yes    Partners: Female  Other Topics Concern   Not on file  Social History Narrative   4 caffeine drink per day.  40 min cardio/strength 6 days per week. Retired from [employer].             Social Determinants of Health   Financial Resource Strain: Not on file  Food Insecurity: Not on file  Transportation Needs: Not on file  Physical Activity: Not on file  Stress: Not on file  Social Connections: Not on file    Family History  Problem Relation Age of Onset   GER disease Father    Other Father        Gilliam-Barre   Diabetes Father    Colon cancer Mother    Diabetes Other     Health Maintenance  Topic Date Due  Zoster Vaccines- Shingrix (1 of 2) Never done   TETANUS/TDAP  10/12/2017   COVID-19 Vaccine (3 - Pfizer risk series) 02/27/2020   INFLUENZA VACCINE  Completed   HPV VACCINES  Aged Out     ----------------------------------------------------------------------------------------------------------------------------------------------------------------------------------------------------------------- Physical Exam BP (!) 154/68 (BP Location: Left Arm, Patient Position: Sitting, Cuff Size: Normal)   Pulse 69   Temp (!) 97 F (36.1 C)   Ht 6\' 3"  (1.905 m)   Wt 181 lb (82.1 kg)   SpO2 100%   BMI 22.62 kg/m   Physical Exam Constitutional:      Appearance: Normal appearance.  Cardiovascular:     Rate and Rhythm: Normal rate and regular rhythm.  Pulmonary:     Effort: Pulmonary effort is normal.     Breath sounds: Normal breath sounds.  Musculoskeletal:     Comments: He does have tenderness to palpation along the midline lumbar spine  and sacral area.  Range of motion is limited due to pain with flexion and full extension.  Mild increased pain with straight leg raise bilaterally.  Normal lower extremity strength bilaterally.  Neurological:     General: No focal deficit present.     Mental Status: He is alert.  Psychiatric:        Mood and Affect: Mood normal.        Behavior: Behavior normal.    ------------------------------------------------------------------------------------------------------------------------------------------------------------------------------------------------------------------- Assessment and Plan  Neuropathy, lumbosacral (radicular) Acute on chronic low back pain.  Updated imaging ordered .  Start Medrol Dosepak.  Continue Celebrex and Lyrica.  Red flags reviewed with him.   Meds ordered this encounter  Medications   methylPREDNISolone (MEDROL DOSEPAK) 4 MG TBPK tablet    Sig: Taper as directed on packaging.    Dispense:  21 tablet    Refill:  0   pantoprazole (PROTONIX) 40 MG tablet    Sig: Take 1 tablet (40 mg total) by mouth 2 (two) times daily.    Dispense:  180 tablet    Refill:  4   pregabalin (LYRICA) 100 MG capsule    Sig: Take 1 capsule (100 mg total) by mouth 2 (two) times daily.    Dispense:  60 capsule    Refill:  0    No follow-ups on file.    This visit occurred during the SARS-CoV-2 public health emergency.  Safety protocols were in place, including screening questions prior to the visit, additional usage of staff PPE, and extensive cleaning of exam room while observing appropriate contact time as indicated for disinfecting solutions.

## 2021-07-16 NOTE — Patient Instructions (Signed)
Have xray completed.  Start medrol dosepak.  Add tylenol as needed.  Try heat to area.  You can also try muscle rubs/salonpas patches.

## 2021-07-28 ENCOUNTER — Other Ambulatory Visit: Payer: Self-pay

## 2021-07-28 ENCOUNTER — Ambulatory Visit (INDEPENDENT_AMBULATORY_CARE_PROVIDER_SITE_OTHER): Payer: Medicare Other | Admitting: Family Medicine

## 2021-07-28 ENCOUNTER — Encounter: Payer: Self-pay | Admitting: Family Medicine

## 2021-07-28 DIAGNOSIS — R21 Rash and other nonspecific skin eruption: Secondary | ICD-10-CM | POA: Diagnosis not present

## 2021-07-28 MED ORDER — KETOCONAZOLE 2 % EX CREA: 1.0000 "application " | TOPICAL_CREAM | Freq: Every day | CUTANEOUS | 0 refills | Status: DC

## 2021-07-28 MED ORDER — FLUCONAZOLE 150 MG PO TABS: 150.0000 mg | ORAL_TABLET | ORAL | 0 refills | Status: DC

## 2021-07-28 NOTE — Assessment & Plan Note (Addendum)
Recent symptoms started after taking methylprednisolone.  I would favor more of a fungal infection rather than eczematous type rash.  We will add fluconazole 150 mg weekly x4 weeks.  We will also add topical ketoconazole 2%. He will let me know if not resolving with this.

## 2021-07-28 NOTE — Progress Notes (Signed)
Justin Mcgee - 86 y.o. male MRN [id number]  Date of birth: May 20, 1935  Subjective Chief Complaint  Patient presents with   Rash    HPI Burnette is a 86 year old male here today with complaint of rash.  Recently seen and treated for low back pain with Medrol Dosepak.  No other medication changes at that time.  He now has rash along his right upper chest, back and flank area.  Area is itchy.  He denies pain associated with this.  There is no blistering noted.  He has not tried anything on this so far.  He has had similar rash in the past that was treated with oral fluconazole and topical ketoconazole with resolution.  He denies fever or increased joint pain.  ROS:  A comprehensive ROS was completed and negative except as noted per HPI  Allergies  Allergen Reactions   Flomax [Tamsulosin Hcl] Other (See Comments)    Dizziness   Gabapentin Other (See Comments)    Unsteadiness and Balance problems.     Other Other (See Comments)    Topical sulfa only. Gets skin irritation.   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 Marica Otter, no radiation or chemo, clear LN.     Past Surgical History:  Procedure Laterality Date   APPENDECTOMY  1997   CATARACT EXTRACTION     CHOLECYSTECTOMY  05/16/10   in [place] while on vacation   [place] Left 1994   PARTIAL GLOSSECTOMY     with unilat neck dissection   TOTAL KNEE ARTHROPLASTY Left 03/07/2021   Procedure: TOTAL KNEE ARTHROPLASTY;  Surgeon: Dorna Leitz, MD;  Location: [facility];  Service: Orthopedics;  Laterality: Left;    Social History   Socioeconomic History   Marital status: Married    Spouse name: Hy Swiatek     Number of children: 2   Years of education: 14   Highest education level: Some college, no degree  Occupational History   Occupation: Charity fundraiser: [employer]  Tobacco Use   Smoking status: Former    Types: Cigarettes    Quit date: 02/19/1978    Years since quitting: 43.4   Smokeless tobacco: Never  Vaping Use   Vaping Use: Never used  Substance and Sexual Activity   Alcohol use: No    Alcohol/week: 0.0 standard drinks   Drug use: No   Sexual activity: Yes    Partners: Female  Other Topics Concern   Not on file  Social History Narrative   4 caffeine drink per day.  40 min cardio/strength 6 days per week. Retired from [employer].             Social Determinants of Health   Financial Resource Strain: Not on file  Food Insecurity: Not on file  Transportation Needs: Not on file  Physical Activity: Not on file  Stress: Not on file  Social Connections: Not on file    Family History  Problem Relation Age of Onset   GER disease Father    Other Father  Gilliam-Barre   Diabetes Father    Colon cancer Mother    Diabetes Other     Health Maintenance  Topic Date Due   Zoster Vaccines- Shingrix (1 of 2) Never done   TETANUS/TDAP  10/12/2017   COVID-19 Vaccine (3 - Pfizer risk series) 02/27/2020   INFLUENZA VACCINE  Completed   HPV VACCINES  Aged Out     ----------------------------------------------------------------------------------------------------------------------------------------------------------------------------------------------------------------- Physical Exam BP (!) 128/52 (BP Location: Left Arm, Patient Position: Sitting, Cuff Size: Normal)   Pulse 66   Temp 98.1 F (36.7 C)   Ht 6\' 3"  (1.905 m)   Wt 180 lb (81.6 kg)   SpO2 97%   BMI 22.50 kg/m   Physical Exam Constitutional:      Appearance: Normal appearance.  Eyes:     General: No scleral icterus. Cardiovascular:     Rate and Rhythm: Normal rate and regular  rhythm.  Pulmonary:     Effort: Pulmonary effort is normal.     Breath sounds: Normal breath sounds.  Musculoskeletal:     Cervical back: Neck supple.  Skin:    Comments: Scattered papular rash along the right upper back and shoulder area.  There is a larger area just below the axilla with annular appearance.  There is some scaling around the edges.  Neurological:     Mental Status: He is alert.  Psychiatric:        Mood and Affect: Mood normal.        Behavior: Behavior normal.    ------------------------------------------------------------------------------------------------------------------------------------------------------------------------------------------------------------------- Assessment and Plan  Rash Recent symptoms started after taking methylprednisolone.  I would favor more of a fungal infection rather than eczematous type rash.  We will add fluconazole 150 mg weekly x4 weeks.  We will also add topical ketoconazole 2%. He will let me know if not resolving with this.   Meds ordered this encounter  Medications   fluconazole (DIFLUCAN) 150 MG tablet    Sig: Take 1 tablet (150 mg total) by mouth once a week. Use for 4 weeks    Dispense:  4 tablet    Refill:  0   ketoconazole (NIZORAL) 2 % cream    Sig: Apply 1 application topically daily.    Dispense:  30 g    Refill:  0    No follow-ups on file.    This visit occurred during the SARS-CoV-2 public health emergency.  Safety protocols were in place, including screening questions prior to the visit, additional usage of staff PPE, and extensive cleaning of exam room while observing appropriate contact time as indicated for disinfecting solutions.

## 2021-08-02 ENCOUNTER — Encounter: Payer: Self-pay | Admitting: Family Medicine

## 2021-08-04 ENCOUNTER — Other Ambulatory Visit: Payer: Self-pay

## 2021-08-04 MED ORDER — PANTOPRAZOLE SODIUM 40 MG PO TBEC: 40.0000 mg | DELAYED_RELEASE_TABLET | Freq: Two times a day (BID) | ORAL | 4 refills | Status: DC

## 2021-09-09 DIAGNOSIS — M25562 Pain in left knee: Secondary | ICD-10-CM | POA: Diagnosis not present

## 2021-09-29 DIAGNOSIS — H52203 Unspecified astigmatism, bilateral: Secondary | ICD-10-CM | POA: Diagnosis not present

## 2021-09-29 DIAGNOSIS — Z961 Presence of intraocular lens: Secondary | ICD-10-CM | POA: Diagnosis not present

## 2021-11-02 IMAGING — DX DG LUMBAR SPINE COMPLETE 4+V
5 series · 5 of 5 positions shown · non-contrast
Comparison: None.

CLINICAL DATA: Low back and buttock pain for the past 3 weeks.

EXAM:
LUMBAR SPINE - COMPLETE 4+ VIEW

[l-spine ap]
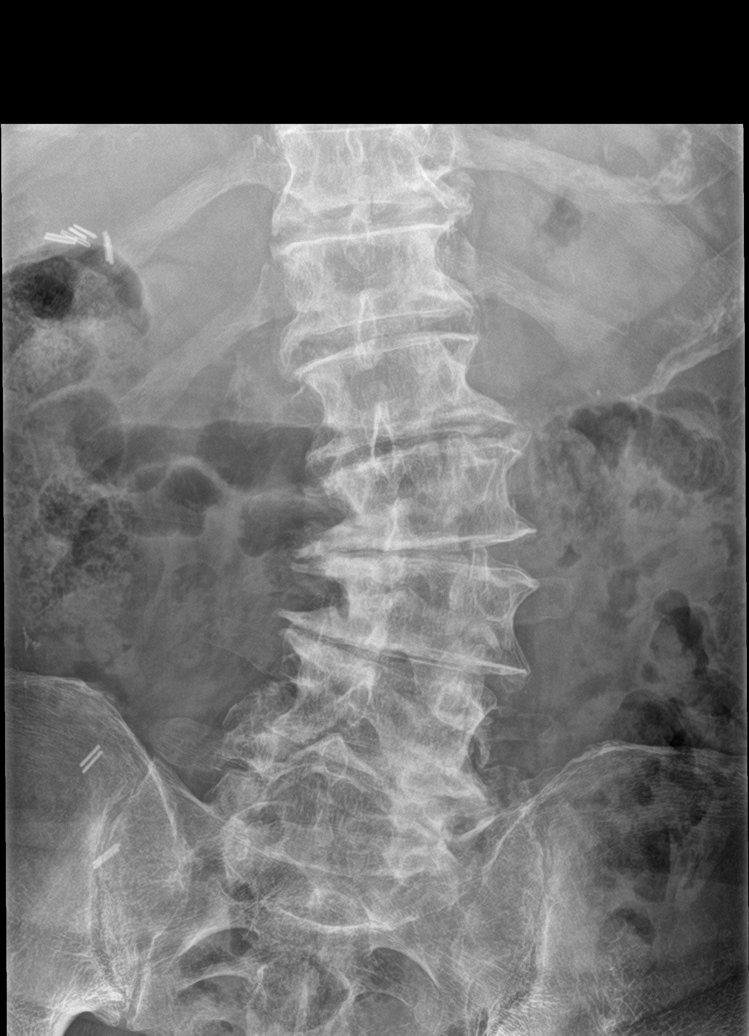

[l-spine obl (1 of 2)]
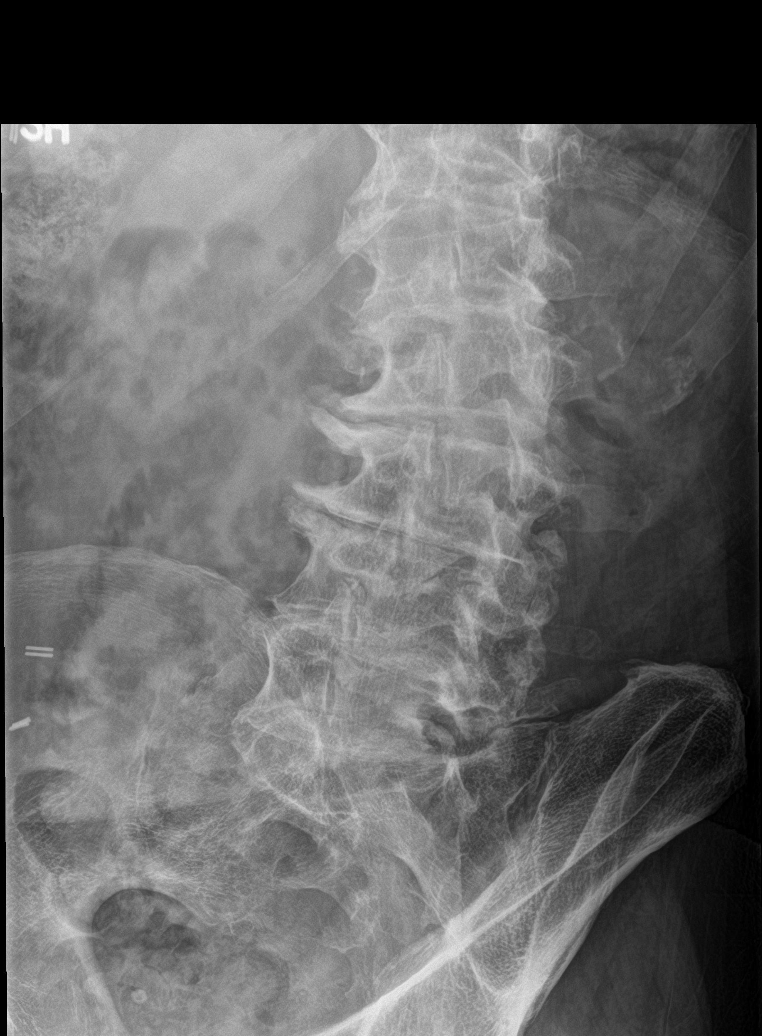

[l-spine obl (2 of 2)]
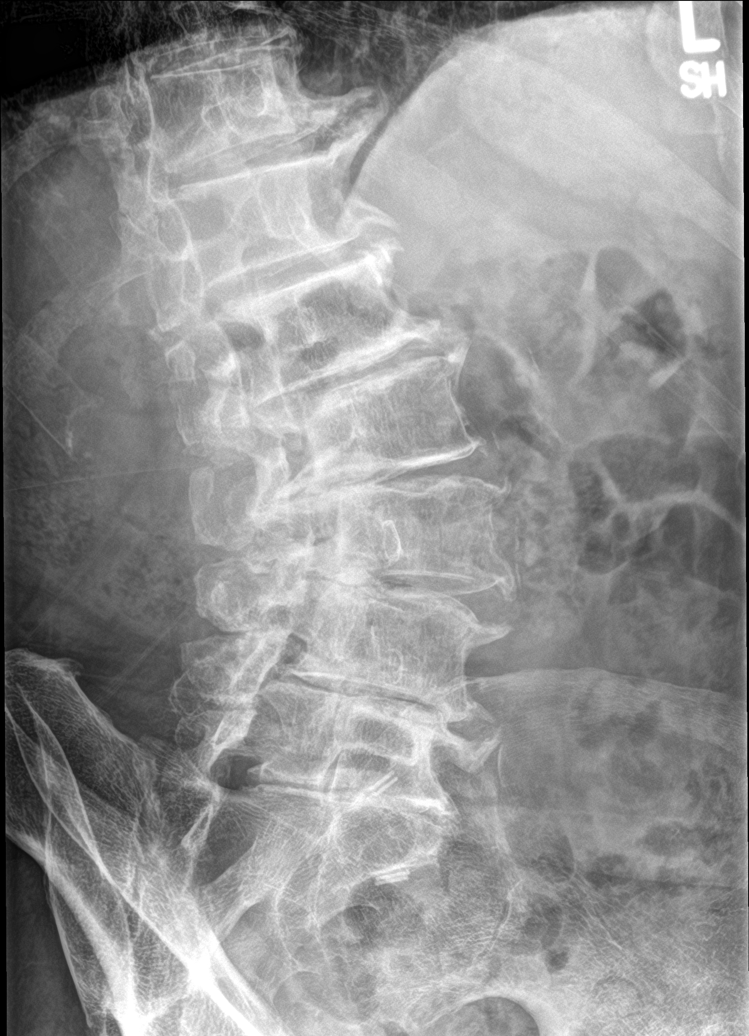

[l-spine lat]
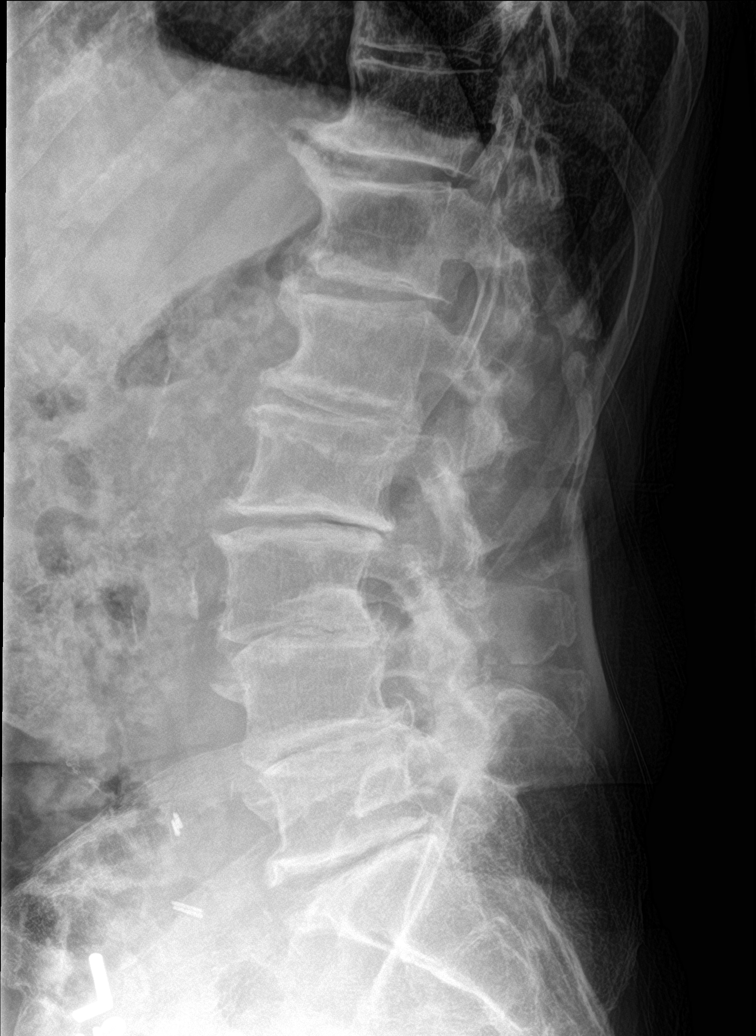

[l-spine spot]
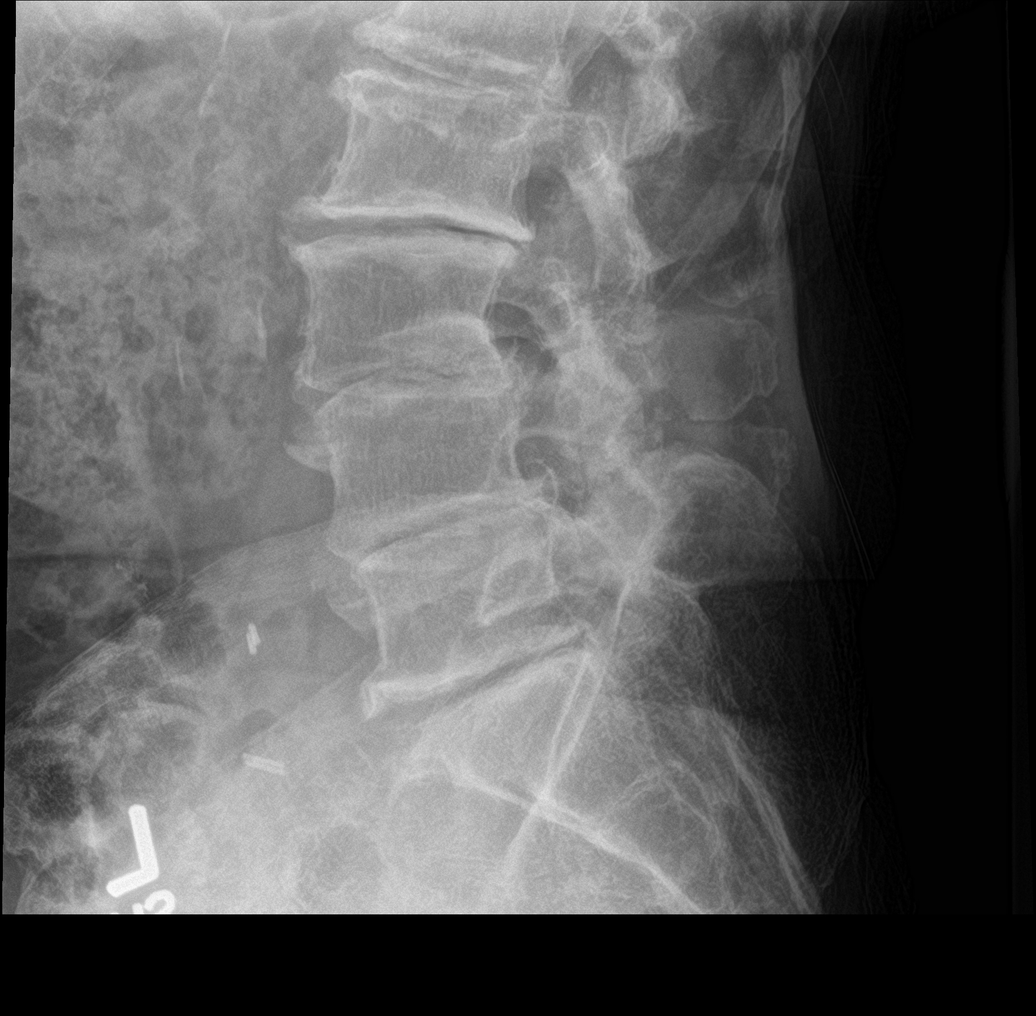

[5 of 5 positions shown; findings below may reference images not displayed]

FINDINGS: Five lumbar type vertebral bodies.

No acute fracture or subluxation. Vertebral body heights are
preserved.

Levoscoliosis.  No significant listhesis.

Severe disc height loss throughout the lumbar spine.
IMPRESSION: 1. Severe lumbar spondylosis.

## 2021-11-18 ENCOUNTER — Other Ambulatory Visit: Payer: Self-pay

## 2021-11-18 DIAGNOSIS — M5417 Radiculopathy, lumbosacral region: Secondary | ICD-10-CM

## 2021-11-18 MED ORDER — PREGABALIN 100 MG PO CAPS: 100.0000 mg | ORAL_CAPSULE | Freq: Two times a day (BID) | ORAL | 1 refills | Status: DC

## 2021-11-18 NOTE — Addendum Note (Signed)
Addended by: Perlie Mayo on: 11/18/2021 04:52 PM   Modules accepted: Orders

## 2021-11-18 NOTE — Addendum Note (Signed)
Addended by: Peggye Ley on: 11/18/2021 04:50 PM   Modules accepted: Orders

## 2021-11-19 ENCOUNTER — Encounter: Payer: Self-pay | Admitting: Family Medicine

## 2021-11-19 DIAGNOSIS — M5417 Radiculopathy, lumbosacral region: Secondary | ICD-10-CM

## 2021-11-19 MED ORDER — PREGABALIN 100 MG PO CAPS: 100.0000 mg | ORAL_CAPSULE | Freq: Two times a day (BID) | ORAL | 0 refills | Status: DC

## 2021-11-19 NOTE — Telephone Encounter (Signed)
Pres[facility].  Additional 1 week supply sent to local pharmacy.  Thanks!  CM

## 2021-12-02 ENCOUNTER — Other Ambulatory Visit: Payer: Self-pay | Admitting: Family Medicine

## 2021-12-02 ENCOUNTER — Other Ambulatory Visit: Payer: Self-pay

## 2021-12-02 DIAGNOSIS — M5417 Radiculopathy, lumbosacral region: Secondary | ICD-10-CM

## 2021-12-02 MED ORDER — PREGABALIN 100 MG PO CAPS: 100.0000 mg | ORAL_CAPSULE | Freq: Two times a day (BID) | ORAL | 2 refills | Status: DC

## 2021-12-06 ENCOUNTER — Other Ambulatory Visit: Payer: Self-pay | Admitting: Sports Medicine

## 2021-12-06 DIAGNOSIS — M170 Bilateral primary osteoarthritis of knee: Secondary | ICD-10-CM

## 2021-12-08 ENCOUNTER — Telehealth: Payer: Self-pay | Admitting: Family Medicine

## 2021-12-08 NOTE — Telephone Encounter (Signed)
Called patient for clarification. Women answered stating she would have him return our call.

## 2021-12-08 NOTE — Telephone Encounter (Signed)
Please clarify.  Is he taking the 50 or 100mg  dose?

## 2021-12-08 NOTE — Telephone Encounter (Signed)
Patient stated that the RX listed below was sent through [facility], and that [facility] said they do NOT have the 100 MG only the 50 MG and this needs to be authorized by provider.   pregabalin (LYRICA) 100 MG capsule

## 2021-12-10 ENCOUNTER — Other Ambulatory Visit: Payer: Self-pay

## 2021-12-10 DIAGNOSIS — M5417 Radiculopathy, lumbosacral region: Secondary | ICD-10-CM

## 2021-12-10 MED ORDER — PREGABALIN 100 MG PO CAPS: 100.0000 mg | ORAL_CAPSULE | Freq: Two times a day (BID) | ORAL | 2 refills | Status: DC

## 2021-12-10 NOTE — Telephone Encounter (Signed)
Left message advising patient to call back with dose information.  ?

## 2021-12-10 NOTE — Telephone Encounter (Signed)
Patient called say [facility] does not have any stock for the Lyrica. He requested a rx be sent to his local [facility].  30 day supply  ?

## 2022-04-29 ENCOUNTER — Ambulatory Visit (INDEPENDENT_AMBULATORY_CARE_PROVIDER_SITE_OTHER): Payer: Medicare Other | Admitting: Family Medicine

## 2022-04-29 ENCOUNTER — Encounter: Payer: Self-pay | Admitting: Family Medicine

## 2022-04-29 ENCOUNTER — Ambulatory Visit: Payer: Medicare Other | Admitting: Family Medicine

## 2022-04-29 VITALS — BP 125/71 | HR 62 | Temp 98.1°F | Ht 74.0 in | Wt 182.0 lb

## 2022-04-29 DIAGNOSIS — N1831 Chronic kidney disease, stage 3a: Secondary | ICD-10-CM

## 2022-04-29 DIAGNOSIS — H9190 Unspecified hearing loss, unspecified ear: Secondary | ICD-10-CM | POA: Insufficient documentation

## 2022-04-29 DIAGNOSIS — M5417 Radiculopathy, lumbosacral region: Secondary | ICD-10-CM | POA: Diagnosis not present

## 2022-04-29 DIAGNOSIS — L570 Actinic keratosis: Secondary | ICD-10-CM | POA: Diagnosis not present

## 2022-04-29 DIAGNOSIS — R7301 Impaired fasting glucose: Secondary | ICD-10-CM | POA: Diagnosis not present

## 2022-04-29 DIAGNOSIS — K2100 Gastro-esophageal reflux disease with esophagitis, without bleeding: Secondary | ICD-10-CM | POA: Diagnosis not present

## 2022-04-29 MED ORDER — PANTOPRAZOLE SODIUM 40 MG PO TBEC: 40.0000 mg | DELAYED_RELEASE_TABLET | Freq: Every day | ORAL | 3 refills | Status: DC

## 2022-04-29 MED ORDER — PANTOPRAZOLE SODIUM 40 MG PO TBEC: 40.0000 mg | DELAYED_RELEASE_TABLET | Freq: Every day | ORAL | 0 refills | Status: DC

## 2022-04-29 NOTE — Assessment & Plan Note (Signed)
He is no longer taking lyrica.  Discussed he may use as needed.

## 2022-04-29 NOTE — Progress Notes (Signed)
Justin Mcgee - 87 y.o. male MRN [id number]  Date of birth: [birth year]  Subjective Chief Complaint  Patient presents with   Follow-up    Cut back on Lyrica and Protonix   Referral    ENT    HPI Justin Mcgee is a 87 y.o. male here today for follow up.  History of lumbosacral neuropathy.  Currently prescribed lyrica '100mg'$  bid.  He has stopped this due to concern about long term side effects.  Continues to have pain in his feet.  He would like to reduce protonix as well. Currently taking once per day.     Has lesion on hand that is not healing well. Would like this checked out.   He is concerned about hearing loss.  Has hearing aids but He would like referral for ENT.    ROS:  A comprehensive ROS was completed and negative except as noted per HPI   Allergies  Allergen Reactions   Flomax [Tamsulosin Hcl] Other (See Comments)    Dizziness   Gabapentin Other (See Comments)    Unsteadiness and Balance problems.     Other Other (See Comments)    Topical sulfa only. Gets skin irritation.   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 Marica Otter, no radiation or chemo, clear LN.     Past Surgical History:  Procedure Laterality Date   APPENDECTOMY  1997   CATARACT EXTRACTION     CHOLECYSTECTOMY  05/16/10   in [place] while on vacation   [place] Left 1994   PARTIAL GLOSSECTOMY     with unilat neck dissection   TOTAL KNEE ARTHROPLASTY Left 03/07/2021   Procedure: TOTAL KNEE ARTHROPLASTY;  Surgeon: Dorna Leitz, MD;  Location: [facility];  Service: Orthopedics;  Laterality: Left;    Social History   Socioeconomic History   Marital status: Married    Spouse  name: Kenson Groh    Number of children: 2   Years of education: 14   Highest education level: Some college, no degree  Occupational History   Occupation: Charity fundraiser: [employer]  Tobacco Use   Smoking status: Former    Types: Cigarettes    Quit date: 02/19/1978    Years since quitting: 44.2   Smokeless tobacco: Never  Vaping Use   Vaping Use: Never used  Substance and Sexual Activity   Alcohol use: No    Alcohol/week: 0.0 standard drinks of alcohol   Drug use: No   Sexual activity: Yes    Partners: Female  Other Topics Concern   Not on file  Social History Narrative   4 caffeine drink per day.  40 min cardio/strength 6 days per week. Retired from [employer].             Social Determinants of Health   Financial Resource Strain: Not on file  Food Insecurity: Not on file  Transportation Needs: Not on file  Physical Activity: Not on file  Stress: Not on file  Social Connections: Not on file    Family History  Problem  Relation Age of Onset   GER disease Father    Other Father        Gilliam-Barre   Diabetes Father    Colon cancer Mother    Diabetes Other     Health Maintenance  Topic Date Due   COVID-19 Vaccine (3 - Pfizer risk series) 05/15/2022 (Originally 02/27/2020)   Zoster Vaccines- Shingrix (1 of 2) 07/30/2022 (Originally 12/28/1953)   Pneumonia Vaccine 40+ Years old (2 - PPSV23 or PCV20) 04/30/2023 (Originally 07/16/2016)   TETANUS/TDAP  04/30/2023 (Originally 10/12/2017)   INFLUENZA VACCINE  05/12/2022   HPV VACCINES  Aged Out     ----------------------------------------------------------------------------------------------------------------------------------------------------------------------------------------------------------------- Physical Exam BP 125/71 (BP Location: Left Arm, Patient Position: Sitting, Cuff Size: Normal)   Pulse 62   Temp 98.1 F (36.7 C) (Oral)   Ht '6\' 2"'$  (1.88 m)   Wt 182 lb (82.6 kg)   SpO2 98%    BMI 23.37 kg/m   Physical Exam Constitutional:      Appearance: Normal appearance.  Eyes:     General: No scleral icterus. Cardiovascular:     Rate and Rhythm: Normal rate and regular rhythm.  Pulmonary:     Effort: Pulmonary effort is normal.     Breath sounds: Normal breath sounds.  Musculoskeletal:     Cervical back: Neck supple.  Neurological:     Mental Status: He is alert.  Psychiatric:        Mood and Affect: Mood normal.        Behavior: Behavior normal.    Procedure:  Lesion on R hand treated with liquid nitrogen.  3 freeze/thaw cycles completed with 14m frost ring with each cycle.  He tolerated this well.  Post-procedure instructions given.  ------------------------------------------------------------------------------------------------------------------------------------------------------------------------------------------------------------------- Assessment and Plan  GERD (gastroesophageal reflux disease) He is trying to cut back on protonix.  He will stick with 1 tab daily for now.    CKD (chronic kidney disease) stage 3, GFR 30-59 ml/min Update renal function.   Hearing loss Has hearing aid but would like referral back to ENT.  Orders entered.   Neuropathy, lumbosacral (radicular) He is no longer taking lyrica.  Discussed he may use as needed.    Actinic keratosis Treated with liquid nitrogen today.    No orders of the defined types were placed in this encounter.   No follow-ups on file.    This visit occurred during the SARS-CoV-2 public health emergency.  Safety protocols were in place, including screening questions prior to the visit, additional usage of staff PPE, and extensive cleaning of exam room while observing appropriate contact time as indicated for disinfecting solutions.

## 2022-04-29 NOTE — Assessment & Plan Note (Signed)
Treated with liquid nitrogen today.

## 2022-04-29 NOTE — Assessment & Plan Note (Signed)
Update renal function.  

## 2022-04-29 NOTE — Assessment & Plan Note (Signed)
Has hearing aid but would like referral back to ENT.  Orders entered.

## 2022-04-29 NOTE — Patient Instructions (Signed)
You may use lyrica as needed.  We'll be in touch with lab results

## 2022-04-29 NOTE — Assessment & Plan Note (Signed)
He is trying to cut back on protonix.  He will stick with 1 tab daily for now.

## 2022-04-30 LAB — COMPLETE METABOLIC PANEL WITH GFR
AG Ratio: 2 (calc) (ref 1.0–2.5)
ALT: 8 U/L — ABNORMAL LOW (ref 9–46)
AST: 14 U/L (ref 10–35)
Albumin: 4.3 g/dL (ref 3.6–5.1)
Alkaline phosphatase (APISO): 84 U/L (ref 35–144)
BUN/Creatinine Ratio: 16 (calc) (ref 6–22)
BUN: 21 mg/dL (ref 7–25)
CO2: 29 mmol/L (ref 20–32)
Calcium: 9.3 mg/dL (ref 8.6–10.3)
Chloride: 105 mmol/L (ref 98–110)
Creat: 1.35 mg/dL — ABNORMAL HIGH (ref 0.70–1.22)
Globulin: 2.2 g/dL (calc) (ref 1.9–3.7)
Glucose, Bld: 105 mg/dL — ABNORMAL HIGH (ref 65–99)
Potassium: 4.3 mmol/L (ref 3.5–5.3)
Sodium: 142 mmol/L (ref 135–146)
Total Bilirubin: 0.4 mg/dL (ref 0.2–1.2)
Total Protein: 6.5 g/dL (ref 6.1–8.1)
eGFR: 51 mL/min/{1.73_m2} — ABNORMAL LOW (ref >=60)

## 2022-04-30 LAB — CBC WITH DIFFERENTIAL/PLATELET
Absolute Monocytes: 442 cells/uL (ref 200–950)
Basophils Absolute: 52 cells/uL (ref 0–200)
Basophils Relative: 1 %
Eosinophils Absolute: 203 cells/uL (ref 15–500)
Eosinophils Relative: 3.9 %
HCT: 40.7 % (ref 38.5–50.0)
Hemoglobin: 13.8 g/dL (ref 13.2–17.1)
Lymphs Abs: 1300 cells/uL (ref 850–3900)
MCH: 30.7 pg (ref 27.0–33.0)
MCHC: 33.9 g/dL (ref 32.0–36.0)
MCV: 90.4 fL (ref 80.0–100.0)
MPV: 10.7 fL (ref 7.5–12.5)
Monocytes Relative: 8.5 %
Neutro Abs: 3203 cells/uL (ref 1500–7800)
Neutrophils Relative %: 61.6 %
Platelets: 144 10*3/uL (ref 140–400)
RBC: 4.5 10*6/uL (ref 4.20–5.80)
RDW: 11.8 % (ref 11.0–15.0)
Total Lymphocyte: 25 %
WBC: 5.2 10*3/uL (ref 3.8–10.8)

## 2022-04-30 LAB — HEMOGLOBIN A1C
Hgb A1c MFr Bld: 5.5 % of total Hgb (ref <5.7)
Mean Plasma Glucose: 111 mg/dL
eAG (mmol/L): 6.2 mmol/L

## 2022-05-06 ENCOUNTER — Ambulatory Visit (INDEPENDENT_AMBULATORY_CARE_PROVIDER_SITE_OTHER): Payer: Medicare Other | Admitting: Sports Medicine

## 2022-05-06 ENCOUNTER — Ambulatory Visit (INDEPENDENT_AMBULATORY_CARE_PROVIDER_SITE_OTHER): Payer: Medicare Other

## 2022-05-06 DIAGNOSIS — M19012 Primary osteoarthritis, left shoulder: Secondary | ICD-10-CM

## 2022-05-06 NOTE — Progress Notes (Signed)
    Procedures performed today:    Procedure: Real-time Ultrasound Guided injection of the left glenohumeral joint Device: Samsung HS60  Verbal informed consent obtained.  Time-out conducted.  Noted no overlying erythema, induration, or other signs of local infection.  Skin prepped in a sterile fashion.  Local anesthesia: Topical Ethyl chloride.  With sterile technique and under real time ultrasound guidance:  Noted moderate osteoarthritis, 1 cc Kenalog 40, 2 cc lidocaine, 2 cc bupivacaine injected easily Completed without difficulty  Advised to call if fevers/chills, erythema, induration, drainage, or persistent bleeding.  Images permanently stored and available for review in PACS.  Impression: Technically successful ultrasound guided injection.  Independent interpretation of notes and tests performed by another provider:   None.  Brief History, Exam, Impression, and Recommendations:    Primary osteoarthritis of left shoulder Justin Mcgee is a very pleasant 87 year old male, he does have glenohumeral osteoarthritis, he has done well with occasional glenohumeral injections and home physical therapy. Last injection was in February 2022, now with recurrence of pain, repeat left glenohumeral joint injection today. Return as needed.  Chronic process with exacerbation and pharmacologic intervention  ____________________________________________ Gwen Her. Dianah Field, M.D., ABFM., CAQSM., AME. Primary Care and Sports Medicine [facility]  Adjunct Professor of [employer]  Risk manager

## 2022-05-06 NOTE — Assessment & Plan Note (Signed)
Justin Mcgee is a very pleasant 87 year old male, he does have glenohumeral osteoarthritis, he has done well with occasional glenohumeral injections and home physical therapy. Last injection was in February 2022, now with recurrence of pain, repeat left glenohumeral joint injection today. Return as needed.

## 2022-05-15 ENCOUNTER — Telehealth: Payer: Self-pay

## 2022-05-15 NOTE — Telephone Encounter (Signed)
Contact [facility] and left a voicemail requesting they contact the patient concerning 04/29/2022 referral sent for hearing loss.  Advised the patient to expect a call from [facility].

## 2022-05-15 NOTE — Telephone Encounter (Signed)
Pt lvm stating he has not heard from ENT concerning hearing test scheduling.   Please contact patient with an update. Thanks

## 2022-06-03 ENCOUNTER — Encounter: Payer: Self-pay | Admitting: General Practice

## 2022-07-25 ENCOUNTER — Other Ambulatory Visit: Payer: Self-pay | Admitting: Family Medicine

## 2022-08-07 DIAGNOSIS — H903 Sensorineural hearing loss, bilateral: Secondary | ICD-10-CM | POA: Insufficient documentation

## 2022-10-13 DIAGNOSIS — H01002 Unspecified blepharitis right lower eyelid: Secondary | ICD-10-CM | POA: Diagnosis not present

## 2022-10-13 DIAGNOSIS — H52203 Unspecified astigmatism, bilateral: Secondary | ICD-10-CM | POA: Diagnosis not present

## 2022-10-13 DIAGNOSIS — H40013 Open angle with borderline findings, low risk, bilateral: Secondary | ICD-10-CM | POA: Diagnosis not present

## 2022-10-13 DIAGNOSIS — Z961 Presence of intraocular lens: Secondary | ICD-10-CM | POA: Diagnosis not present

## 2022-10-13 DIAGNOSIS — H01005 Unspecified blepharitis left lower eyelid: Secondary | ICD-10-CM | POA: Diagnosis not present

## 2023-01-20 ENCOUNTER — Telehealth: Payer: Self-pay | Admitting: Family Medicine

## 2023-01-20 NOTE — Telephone Encounter (Signed)
Contacted Justin Mcgee to schedule their annual wellness visit. Appointment made for 01/27/2023 at 8 AM.  Cira Servant Patient Access Advocate II Direct Dial: [phone]

## 2023-01-28 ENCOUNTER — Observation Stay (HOSPITAL_COMMUNITY)
Admission: EM | Admit: 2023-01-28 | Discharge: 2023-01-29 | Disposition: A | Discharge status: Home / Self Care | Payer: Medicare Other | Attending: Internal Medicine | Admitting: Internal Medicine

## 2023-01-28 ENCOUNTER — Encounter (HOSPITAL_COMMUNITY): Payer: Self-pay

## 2023-01-28 ENCOUNTER — Other Ambulatory Visit: Payer: Self-pay

## 2023-01-28 DIAGNOSIS — D696 Thrombocytopenia, unspecified: Secondary | ICD-10-CM | POA: Diagnosis not present

## 2023-01-28 DIAGNOSIS — K920 Hematemesis: Secondary | ICD-10-CM

## 2023-01-28 DIAGNOSIS — N183 Chronic kidney disease, stage 3 unspecified: Secondary | ICD-10-CM | POA: Diagnosis not present

## 2023-01-28 DIAGNOSIS — Z96652 Presence of left artificial knee joint: Secondary | ICD-10-CM | POA: Insufficient documentation

## 2023-01-28 DIAGNOSIS — M5417 Radiculopathy, lumbosacral region: Secondary | ICD-10-CM | POA: Diagnosis present

## 2023-01-28 DIAGNOSIS — K259 Gastric ulcer, unspecified as acute or chronic, without hemorrhage or perforation: Secondary | ICD-10-CM

## 2023-01-28 DIAGNOSIS — N400 Enlarged prostate without lower urinary tract symptoms: Secondary | ICD-10-CM | POA: Diagnosis present

## 2023-01-28 DIAGNOSIS — Z87891 Personal history of nicotine dependence: Secondary | ICD-10-CM | POA: Insufficient documentation

## 2023-01-28 DIAGNOSIS — K922 Gastrointestinal hemorrhage, unspecified: Principal | ICD-10-CM | POA: Diagnosis present

## 2023-01-28 DIAGNOSIS — Z79899 Other long term (current) drug therapy: Secondary | ICD-10-CM | POA: Insufficient documentation

## 2023-01-28 DIAGNOSIS — Z85828 Personal history of other malignant neoplasm of skin: Secondary | ICD-10-CM | POA: Insufficient documentation

## 2023-01-28 DIAGNOSIS — K219 Gastro-esophageal reflux disease without esophagitis: Secondary | ICD-10-CM | POA: Diagnosis present

## 2023-01-28 DIAGNOSIS — R112 Nausea with vomiting, unspecified: Secondary | ICD-10-CM | POA: Diagnosis not present

## 2023-01-28 LAB — CBC
HCT: 40.4 % (ref 39.0–52.0)
HCT: 35 % — ABNORMAL LOW (ref 39.0–52.0)
Hemoglobin: 13.4 g/dL (ref 13.0–17.0)
Hemoglobin: 11.7 g/dL — ABNORMAL LOW (ref 13.0–17.0)
MCH: 30.5 pg (ref 26.0–34.0)
MCH: 30.9 pg (ref 26.0–34.0)
MCHC: 33.2 g/dL (ref 30.0–36.0)
MCHC: 33.4 g/dL (ref 30.0–36.0)
MCV: 91.8 fL (ref 80.0–100.0)
MCV: 92.3 fL (ref 80.0–100.0)
Platelets: 148 10*3/uL — ABNORMAL LOW (ref 150–400)
Platelets: 130 10*3/uL — ABNORMAL LOW (ref 150–400)
RBC: 4.4 MIL/uL (ref 4.22–5.81)
RBC: 3.79 MIL/uL — ABNORMAL LOW (ref 4.22–5.81)
RDW: 12.4 % (ref 11.5–15.5)
RDW: 12.5 % (ref 11.5–15.5)
WBC: 9.2 10*3/uL (ref 4.0–10.5)
WBC: 8 10*3/uL (ref 4.0–10.5)
nRBC: 0 % (ref 0.0–0.2)
nRBC: 0 % (ref 0.0–0.2)

## 2023-01-28 LAB — COMPREHENSIVE METABOLIC PANEL
ALT: 16 U/L (ref 0–44)
AST: 19 U/L (ref 15–41)
Albumin: 4.2 g/dL (ref 3.5–5.0)
Alkaline Phosphatase: 87 U/L (ref 38–126)
Anion gap: 6 (ref 5–15)
BUN: 36 mg/dL — ABNORMAL HIGH (ref 8–23)
CO2: 26 mmol/L (ref 22–32)
Calcium: 8.4 mg/dL — ABNORMAL LOW (ref 8.9–10.3)
Chloride: 104 mmol/L (ref 98–111)
Creatinine, Ser: 1.33 mg/dL — ABNORMAL HIGH (ref 0.61–1.24)
GFR, Estimated: 52 mL/min — ABNORMAL LOW (ref >60)
Glucose, Bld: 166 mg/dL — ABNORMAL HIGH (ref 70–99)
Potassium: 3.8 mmol/L (ref 3.5–5.1)
Sodium: 136 mmol/L (ref 135–145)
Total Bilirubin: 0.8 mg/dL (ref 0.3–1.2)
Total Protein: 7.1 g/dL (ref 6.5–8.1)

## 2023-01-28 LAB — POC OCCULT BLOOD, ED: Fecal Occult Bld: POSITIVE — AB

## 2023-01-28 LAB — LIPASE, BLOOD: Lipase: 34 U/L (ref 11–51)

## 2023-01-28 LAB — TYPE AND SCREEN
ABO/RH(D): O POS
Antibody Screen: NEGATIVE

## 2023-01-28 MED ORDER — ACETAMINOPHEN 650 MG RE SUPP: 650.0000 mg | Freq: Four times a day (QID) | RECTAL | Status: DC | PRN

## 2023-01-28 MED ORDER — PREGABALIN 50 MG PO CAPS
100.0000 mg | ORAL_CAPSULE | Freq: Every day | ORAL | Status: DC
  Administered 2023-01-28: 100 mg via ORAL
  Filled 2023-01-28: qty 2

## 2023-01-28 MED ORDER — PANTOPRAZOLE INFUSION (NEW) - SIMPLE MED
8.0000 mg/h | INTRAVENOUS | Status: DC
  Administered 2023-01-28 – 2023-01-29 (×2): 8 mg/h via INTRAVENOUS
  Filled 2023-01-28 (×2): qty 80
  Filled 2023-01-28 (×2): qty 100

## 2023-01-28 MED ORDER — ACETAMINOPHEN 325 MG PO TABS: 650.0000 mg | ORAL_TABLET | Freq: Four times a day (QID) | ORAL | Status: DC | PRN

## 2023-01-28 MED ORDER — SODIUM CHLORIDE 0.9 % IV SOLN: INTRAVENOUS | Status: DC

## 2023-01-28 MED ORDER — ONDANSETRON HCL 4 MG PO TABS: 4.0000 mg | ORAL_TABLET | Freq: Four times a day (QID) | ORAL | Status: DC | PRN

## 2023-01-28 MED ORDER — ONDANSETRON HCL 4 MG/2ML IJ SOLN: 4.0000 mg | Freq: Four times a day (QID) | INTRAMUSCULAR | Status: DC | PRN

## 2023-01-28 MED ORDER — SODIUM CHLORIDE 0.9 % IV BOLUS
1000.0000 mL | Freq: Once | INTRAVENOUS | Status: AC
  Administered 2023-01-28: 1000 mL via INTRAVENOUS

## 2023-01-28 MED ORDER — ONDANSETRON 4 MG PO TBDP: 4.0000 mg | ORAL_TABLET | Freq: Once | ORAL | Status: DC | PRN

## 2023-01-28 MED ORDER — PANTOPRAZOLE 80MG IVPB - SIMPLE MED
80.0000 mg | Freq: Once | INTRAVENOUS | Status: AC
  Administered 2023-01-28: 80 mg via INTRAVENOUS
  Filled 2023-01-28: qty 80

## 2023-01-28 MED ORDER — ONDANSETRON HCL 4 MG/2ML IJ SOLN
4.0000 mg | Freq: Once | INTRAMUSCULAR | Status: AC
  Administered 2023-01-28: 4 mg via INTRAVENOUS
  Filled 2023-01-28: qty 2

## 2023-01-28 NOTE — ED Triage Notes (Signed)
Diarrhea yesterday multiple times and 1 episode this morning. Pt had 1 episode of dark brown vomiting this morning. Denies abdominal pain. Last normal BM was 4/16.  C/o weakness, fatigue, and nausea.

## 2023-01-28 NOTE — Assessment & Plan Note (Addendum)
On daily celebrex Lyrica  at night

## 2023-01-28 NOTE — ED Notes (Signed)
Attempted to obtain UA sample. Pt unable to void att 

## 2023-01-28 NOTE — ED Provider Notes (Signed)
[place] EMERGENCY DEPARTMENT AT [facility] Provider Note   CSN: [id number] Arrival date & time: 01/28/23  1455     History  Chief Complaint  Patient presents with   Diarrhea   Emesis    Coffee-Ground    Justin Mcgee is a 88 y.o. male history of peripheral neuropathy on Celebrex, here presenting with coffee-ground emesis.  Patient has some diarrhea last night.  He woke up this morning had a large volume coffee ground emesis.  He was able to get in a container and is about 300 cc.  Patient denies any abdominal pain.  Patient had a previous endoscopy with Dr. Christella Hartigan from [facility].  Patient states that he takes Celebrex daily for his neuropathy.  He is not currently on blood thinners.  The history is provided by the patient.       Home Medications Prior to Admission medications   Medication Sig Start Date End Date Taking? Authorizing Provider  celecoxib (CELEBREX) 100 MG capsule TAKE 2 CAPSULES DAILY WITH BREAKFAST Patient not taking: Reported on 04/29/2022 12/08/21   Monica Becton, MD  ketoconazole (NIZORAL) 2 % cream Apply 1 application topically daily. 07/28/21   Everrett Coombe, DO  Multiple Vitamin (MULTIVITAMIN WITH MINERALS) TABS tablet Take 1 tablet by mouth daily. Patient not taking: Reported on 04/29/2022    [provider]  pantoprazole (PROTONIX) 40 MG tablet TAKE 1 TABLET BY MOUTH TWICE A DAY 07/27/22   Everrett Coombe, DO  pregabalin (LYRICA) 100 MG capsule Take 1 capsule (100 mg total) by mouth 2 (two) times daily. 12/10/21 01/09/22  Everrett Coombe, DO      Allergies    Flomax [tamsulosin hcl], Gabapentin, Other, and Sulfa antibiotics    Review of Systems   Review of Systems  Gastrointestinal:  Positive for diarrhea and vomiting.  All other systems reviewed and are negative.   Physical Exam Updated Vital Signs BP (!) 142/77 (BP Location: Right Arm)   Pulse 71   Temp 99.6 F (37.6 C) (Oral)   Resp 18   Ht  (1.905 m)   Wt  78 kg   SpO2 97%   BMI 21.50 kg/m  Physical Exam Vitals and nursing note reviewed.  Constitutional:      Comments: Slightly pale   HENT:     Head: Normocephalic.     Nose: Nose normal.     Mouth/Throat:     Mouth: Mucous membranes are moist.  Eyes:     Extraocular Movements: Extraocular movements intact.     Pupils: Pupils are equal, round, and reactive to light.  Cardiovascular:     Rate and Rhythm: Normal rate and regular rhythm.     Pulses: Normal pulses.     Heart sounds: Normal heart sounds.  Pulmonary:     Effort: Pulmonary effort is normal.     Breath sounds: Normal breath sounds.  Abdominal:     General: Abdomen is flat.     Palpations: Abdomen is soft.  Musculoskeletal:        General: Normal range of motion.     Cervical back: Normal range of motion and neck supple.  Skin:    General: Skin is warm.     Capillary Refill: Capillary refill takes less than 2 seconds.  Neurological:     General: No focal deficit present.     Mental Status: He is oriented to person, place, and time. Mental status is at baseline.  Psychiatric:  Mood and Affect: Mood normal.        Behavior: Behavior normal.     ED Results / Procedures / Treatments   Labs (all labs ordered are listed, but only abnormal results are displayed) Labs Reviewed  COMPREHENSIVE METABOLIC PANEL - Abnormal; Notable for the following components:      Result Value   Glucose, Bld 166 (*)    BUN 36 (*)    Creatinine, Ser 1.33 (*)    Calcium 8.4 (*)    GFR, Estimated 52 (*)    All other components within normal limits  CBC - Abnormal; Notable for the following components:   Platelets 148 (*)    All other components within normal limits  POC OCCULT BLOOD, ED - Abnormal; Notable for the following components:   Fecal Occult Bld POSITIVE (*)    All other components within normal limits  LIPASE, BLOOD  URINALYSIS, ROUTINE W REFLEX MICROSCOPIC  TYPE AND SCREEN    EKG None  Radiology No results  found.  Procedures Procedures    Medications Ordered in ED Medications  sodium chloride 0.9 % bolus 1,000 mL (has no administration in time range)  ondansetron (ZOFRAN) injection 4 mg (has no administration in time range)  pantoprazole (PROTONIX) 80 mg /NS 100 mL IVPB (has no administration in time range)  pantoprozole (PROTONIX) 80 mg /NS 100 mL infusion (has no administration in time range)    ED Course/ Medical Decision Making/ A&P                             Medical Decision Making Justin Mcgee is a 88 y.o. male here presenting with coffee-ground emesis and diarrhea.  Patient has no abdominal pain or tenderness.  Patient is on NSAIDs and likely has NSAIDs induced gastritis.  Plan to get CBC and CMP and guaiac the vomit and also hydrate and start Protonix.  5:13 PM Hg is 13.4.  BUN is 36 over creatinine 1.33.  This is consistent with possible upper GI bleed.  I was able to guaiac the vomit and it was guaiac positive.  Concern for possible NSAIDs induced gastritis versus gastric ulcer.  Started on Protonix bolus and drip.  I messaged Dr. Leonides Schanz who states that [facility] will round on the patient tomorrow.  Hospitalist to admit.  Problems Addressed: Coffee ground emesis: acute illness or injury  Amount and/or Complexity of Data Reviewed Labs: ordered. Decision-making details documented in ED Course.  Risk Prescription drug management.    Final Clinical Impression(s) / ED Diagnoses Final diagnoses:  None    Rx / DC Orders ED Discharge Orders     None         Charlynne Pander, MD 01/28/23 1714

## 2023-01-28 NOTE — H&P (Addendum)
History and Physical    Patient: Justin Mcgee NWG:[id number] DOB: [birth year] DOA: 01/28/2023 DOS: the patient was seen and examined on 01/28/2023 PCP: Everrett Coombe, DO  Patient coming from: Home - lives with his wife. Ambulates independently.    Chief Complaint: coffee ground emesis   HPI: Justin Mcgee is a 88 y.o. male with medical history significant of peripheral neuropathy, hx of SCC fo tongue, BPH, CKD stage 3, hearing loss who presented to ED with complaints of coffee ground emesis. He takes celebrex daily for his neuropathy. He started to have some indigestion and diarrhea yesterday. He states he had diarrhea every 20 minutes until 5AM this morning. He states his diarrhea started off black and then became clear.  Around same time he had a large episode of vomit that was coffee ground in appearance. He is not on any blood thinners. He also is on a daily PPI. No history of any previous GI bleeds. He did have an EGD in 2016 for dysphagia. Found to have a 2cm hiatal hernia and tortorous esophagus, otherwise normal. Denies any stomach pain.   Besides the celebrex daily he also takes two advil PM at night.   He has been feeling good. Denies any fever/chills, vision changes/headaches, chest pain or palpitations, shortness of breath or cough, abdominal pain, dysuria or leg swelling.   He does not smoke or drink alcohol.   ER Course:  vitals: temp: 99.6, bp: 142/77, HR: 71 RR: 18, oxygen: 97%RA Pertinent labs: BUN: 36, creatinine: 1.33, glucose: 166, fecal occult: positive In ED: given 1L IVF and started on protonix drip. GI consulted.  TR[facility] asked to admit.    Review of Systems: As mentioned in the history of present illness. All other systems reviewed and are negative. Past Medical History:  Diagnosis Date   Arthritis    Basal cell carcinoma of antihelix of ear    right and left   Former smoker    Fracture of right radius 1956   Fracture, metacarpal 1956   1, 2, 3   Fracture,  tibia 1964   Squamous cell cancer of buccal mucosa 2004   Dr. Derryl Harbor, no radiation or chemo, clear LN.    Past Surgical History:  Procedure Laterality Date   APPENDECTOMY  1997   CATARACT EXTRACTION     CHOLECYSTECTOMY  05/16/10   in [place] while on vacation   INGUINAL HERNIA REPAIR Right 1965, 1968   INGUINAL HERNIA REPAIR Left 1994   PARTIAL GLOSSECTOMY     with unilat neck dissection   TOTAL KNEE ARTHROPLASTY Left 03/07/2021   Procedure: TOTAL KNEE ARTHROPLASTY;  Surgeon: Jodi Geralds, MD;  Location: [facility];  Service: Orthopedics;  Laterality: Left;   Social History:  reports that he quit smoking about 44 years ago. His smoking use included cigarettes. He has never used smokeless tobacco. He reports that he does not drink alcohol and does not use drugs.  Allergies  Allergen Reactions   Flomax [Tamsulosin Hcl] Other (See Comments)    Dizziness   Gabapentin Other (See Comments)    Unsteadiness and Balance problems.     Sulfa Antibiotics Rash and Other (See Comments)    The topical versions ONLY    Family History  Problem Relation Age of Onset   GER disease Father    Other Father        [place]   Diabetes Father    Colon cancer Mother    Diabetes Other     Prior to  Admission medications   Medication Sig Start Date End Date Taking? Authorizing Provider  celecoxib (CELEBREX) 100 MG capsule TAKE 2 CAPSULES DAILY WITH BREAKFAST Patient not taking: Reported on 04/29/2022 12/08/21   Monica Becton, MD  ketoconazole (NIZORAL) 2 % cream Apply 1 application topically daily. 07/28/21   Everrett Coombe, DO  Multiple Vitamin (MULTIVITAMIN WITH MINERALS) TABS tablet Take 1 tablet by mouth daily. Patient not taking: Reported on 04/29/2022    [provider]  pantoprazole (PROTONIX) 40 MG tablet TAKE 1 TABLET BY MOUTH TWICE A DAY 07/27/22   Everrett Coombe, DO  pregabalin (LYRICA) 100 MG capsule Take 1 capsule (100 mg total) by mouth 2 (two) times daily. 12/10/21  01/09/22  Everrett Coombe, DO    Physical Exam: Vitals:   01/28/23 1505 01/28/23 1506 01/28/23 1853 01/28/23 1930  BP:    112/67  Pulse:    72  Resp:    (!) 25  Temp:   99.1 F (37.3 C)   TempSrc:   Oral   SpO2:    91%  Weight: 78 kg     Height:  (1.88 m)  (1.905 m)     General:  Appears calm and comfortable and is in NAD thin.  Eyes:  PERRL, EOMI, normal lids, iris ENT:  HOH, lips & tongue, dry mucous membranes; appropriate dentition Neck:  no LAD, masses or thyromegaly; no carotid bruits Cardiovascular:  RRR, no m/r/g. No LE edema.  Respiratory:   CTA bilaterally with no wheezes/rales/rhonchi.  Normal respiratory effort. Abdomen:  soft, NT, ND, NABS Back:   normal alignment, no CVAT Skin:  no rash or induration seen on limited exam. + skin tenting  Musculoskeletal:  grossly normal tone BUE/BLE, good ROM, no bony abnormality Lower extremity:  No LE edema.  Limited foot exam with no ulcerations.  2+ distal pulses. Psychiatric:  grossly normal mood and affect, speech fluent and appropriate, AOx3 Neurologic:  CN 2-12 grossly intact, moves all extremities in coordinated fashion, sensation intact   Radiological Exams on Admission: Independently reviewed - see discussion in A/P where applicable  No results found.  UEA:[id number]    Labs on Admission: I have personally reviewed the available labs and imaging studies at the time of the admission.  Pertinent labs:    BUN: 36,  creatinine: 1.33,  glucose: 166,  fecal occult: positive  Assessment and Plan: Principal Problem:   Upper GI bleed Active Problems:   GERD (gastroesophageal reflux disease)   CKD (chronic kidney disease) stage 3, GFR 30-59 ml/min   Neuropathy, lumbosacral (radicular)    Assessment and Plan: * Upper GI bleed 88 year old male presenting with 1 day history of dark stool and coffee ground emesis in setting of chronic NSAID use consistent with upper GI bleed concerning for ulcer.  -obs to  progressive -has had only one episode of emesis this AM and no more diarrhea since early this AM either -hgb stable -serial cbc q 6 hours -continue protonix drip -clear liquids then NPO at midnight -GI consulted, likely EGD tomorrow   GERD (gastroesophageal reflux disease) Only daily protonix, continue protonix gtt   CKD (chronic kidney disease) stage 3, GFR 30-59 ml/min Baseline creatinine 1.2-1.3, stable Continue to monitor   Neuropathy, lumbosacral (radicular) On daily celebrex Lyrica  at night     Advance Care Planning:   Code Status: Full Code   Consults: GI: Dr. Leonides Schanz   DVT Prophylaxis: SCDs  Family Communication: wife and daughter in law at  bedside   Severity of Illness: The appropriate patient status for this patient is OBSERVATION. Observation status is judged to be reasonable and necessary in order to provide the required intensity of service to ensure the patient's safety. The patient's presenting symptoms, physical exam findings, and initial radiographic and laboratory data in the context of their medical condition is felt to place them at decreased risk for further clinical deterioration. Furthermore, it is anticipated that the patient will be medically stable for discharge from the hospital within 2 midnights of admission.   Author: Orland Mustard, MD 01/28/2023 9:38 PM  For on call review [email or web address].

## 2023-01-28 NOTE — Assessment & Plan Note (Signed)
Baseline creatinine 1.2-1.3, stable Continue to monitor

## 2023-01-28 NOTE — Assessment & Plan Note (Signed)
Only daily protonix, continue protonix gtt

## 2023-01-28 NOTE — ED Provider Triage Note (Signed)
Emergency Medicine Provider Triage Evaluation Note  Justin Mcgee , a 88 y.o. male  was evaluated in triage.  Pt complains of vomitting blod.  Pt reports dark vomit.  Pt has a gi doctor in [place]  Review of Systems  Positive: Abdominal pain  Negative: fever  Physical Exam  BP (!) 142/77 (BP Location: Right Arm)   Pulse 71   Temp 99.6 F (37.6 C) (Oral)   Resp 18   Ht  (1.905 m)   Wt 78 kg   SpO2 97%   BMI 21.50 kg/m  Gen:   Awake, no distress   Resp:  Normal effort  MSK:   Moves extremities without difficulty  Other:    Medical Decision Making  Medically screening exam initiated at 3:42 PM.  Appropriate orders placed.  EWELL BENASSI was informed that the remainder of the evaluation will be completed by another provider, this initial triage assessment does not replace that evaluation, and the importance of remaining in the ED until their evaluation is complete.     Elson Areas, [place] 01/28/23 1542

## 2023-01-28 NOTE — Assessment & Plan Note (Signed)
88 year old male presenting with 1 day history of dark stool and coffee ground emesis in setting of chronic NSAID use consistent with upper GI bleed concerning for ulcer.  -obs to progressive -has had only one episode of emesis this AM and no more diarrhea since early this AM either -hgb stable -serial cbc q 6 hours -continue protonix drip -clear liquids then NPO at midnight -GI consulted, likely EGD tomorrow

## 2023-01-29 ENCOUNTER — Encounter (HOSPITAL_COMMUNITY)
Admission: EM | Disposition: A | Discharge status: Home / Self Care | Payer: Self-pay | Source: Home / Self Care | Attending: Emergency Medicine

## 2023-01-29 ENCOUNTER — Observation Stay (HOSPITAL_COMMUNITY): Payer: Medicare Other | Admitting: Anesthesiology

## 2023-01-29 ENCOUNTER — Observation Stay (HOSPITAL_BASED_OUTPATIENT_CLINIC_OR_DEPARTMENT_OTHER): Payer: Medicare Other | Admitting: Anesthesiology

## 2023-01-29 ENCOUNTER — Encounter (HOSPITAL_COMMUNITY): Payer: Self-pay | Admitting: Family Medicine

## 2023-01-29 DIAGNOSIS — K2100 Gastro-esophageal reflux disease with esophagitis, without bleeding: Secondary | ICD-10-CM | POA: Diagnosis not present

## 2023-01-29 DIAGNOSIS — N289 Disorder of kidney and ureter, unspecified: Secondary | ICD-10-CM

## 2023-01-29 DIAGNOSIS — K219 Gastro-esophageal reflux disease without esophagitis: Secondary | ICD-10-CM | POA: Diagnosis not present

## 2023-01-29 DIAGNOSIS — K259 Gastric ulcer, unspecified as acute or chronic, without hemorrhage or perforation: Secondary | ICD-10-CM

## 2023-01-29 DIAGNOSIS — I959 Hypotension, unspecified: Secondary | ICD-10-CM

## 2023-01-29 DIAGNOSIS — N1831 Chronic kidney disease, stage 3a: Secondary | ICD-10-CM | POA: Diagnosis not present

## 2023-01-29 DIAGNOSIS — N401 Enlarged prostate with lower urinary tract symptoms: Secondary | ICD-10-CM

## 2023-01-29 DIAGNOSIS — K920 Hematemesis: Secondary | ICD-10-CM

## 2023-01-29 DIAGNOSIS — K317 Polyp of stomach and duodenum: Secondary | ICD-10-CM

## 2023-01-29 DIAGNOSIS — K289 Gastrojejunal ulcer, unspecified as acute or chronic, without hemorrhage or perforation: Secondary | ICD-10-CM | POA: Diagnosis not present

## 2023-01-29 DIAGNOSIS — K449 Diaphragmatic hernia without obstruction or gangrene: Secondary | ICD-10-CM

## 2023-01-29 DIAGNOSIS — Z87891 Personal history of nicotine dependence: Secondary | ICD-10-CM | POA: Diagnosis not present

## 2023-01-29 DIAGNOSIS — N183 Chronic kidney disease, stage 3 unspecified: Secondary | ICD-10-CM | POA: Diagnosis not present

## 2023-01-29 DIAGNOSIS — R350 Frequency of micturition: Secondary | ICD-10-CM | POA: Diagnosis not present

## 2023-01-29 DIAGNOSIS — D696 Thrombocytopenia, unspecified: Secondary | ICD-10-CM | POA: Diagnosis not present

## 2023-01-29 DIAGNOSIS — D649 Anemia, unspecified: Secondary | ICD-10-CM

## 2023-01-29 DIAGNOSIS — K921 Melena: Secondary | ICD-10-CM | POA: Diagnosis not present

## 2023-01-29 DIAGNOSIS — G629 Polyneuropathy, unspecified: Secondary | ICD-10-CM | POA: Diagnosis not present

## 2023-01-29 DIAGNOSIS — K922 Gastrointestinal hemorrhage, unspecified: Secondary | ICD-10-CM | POA: Diagnosis not present

## 2023-01-29 DIAGNOSIS — I340 Nonrheumatic mitral (valve) insufficiency: Secondary | ICD-10-CM | POA: Diagnosis not present

## 2023-01-29 HISTORY — PX: ESOPHAGOGASTRODUODENOSCOPY (EGD) WITH PROPOFOL: SHX5813

## 2023-01-29 LAB — URINALYSIS, ROUTINE W REFLEX MICROSCOPIC
Bacteria, UA: NONE SEEN
Bilirubin Urine: NEGATIVE
Glucose, UA: NEGATIVE mg/dL
Hgb urine dipstick: NEGATIVE
Ketones, ur: NEGATIVE mg/dL
Leukocytes,Ua: NEGATIVE
Nitrite: NEGATIVE
Protein, ur: 30 mg/dL — AB
Specific Gravity, Urine: 1.026 (ref 1.005–1.030)
pH: 5 (ref 5.0–8.0)

## 2023-01-29 LAB — BASIC METABOLIC PANEL
Anion gap: 6 (ref 5–15)
BUN: 30 mg/dL — ABNORMAL HIGH (ref 8–23)
CO2: 26 mmol/L (ref 22–32)
Calcium: 7.9 mg/dL — ABNORMAL LOW (ref 8.9–10.3)
Chloride: 106 mmol/L (ref 98–111)
Creatinine, Ser: 1.17 mg/dL (ref 0.61–1.24)
GFR, Estimated: >60 mL/min (ref >60)
Glucose, Bld: 107 mg/dL — ABNORMAL HIGH (ref 70–99)
Potassium: 4 mmol/L (ref 3.5–5.1)
Sodium: 138 mmol/L (ref 135–145)

## 2023-01-29 LAB — CBC
HCT: 35.6 % — ABNORMAL LOW (ref 39.0–52.0)
HCT: 35.5 % — ABNORMAL LOW (ref 39.0–52.0)
Hemoglobin: 11.9 g/dL — ABNORMAL LOW (ref 13.0–17.0)
Hemoglobin: 11.6 g/dL — ABNORMAL LOW (ref 13.0–17.0)
MCH: 30.8 pg (ref 26.0–34.0)
MCH: 30.3 pg (ref 26.0–34.0)
MCHC: 33.4 g/dL (ref 30.0–36.0)
MCHC: 32.7 g/dL (ref 30.0–36.0)
MCV: 92.2 fL (ref 80.0–100.0)
MCV: 92.7 fL (ref 80.0–100.0)
Platelets: 124 10*3/uL — ABNORMAL LOW (ref 150–400)
Platelets: 126 10*3/uL — ABNORMAL LOW (ref 150–400)
RBC: 3.86 MIL/uL — ABNORMAL LOW (ref 4.22–5.81)
RBC: 3.83 MIL/uL — ABNORMAL LOW (ref 4.22–5.81)
RDW: 12.6 % (ref 11.5–15.5)
RDW: 12.6 % (ref 11.5–15.5)
WBC: 7.7 10*3/uL (ref 4.0–10.5)
WBC: 7.6 10*3/uL (ref 4.0–10.5)
nRBC: 0 % (ref 0.0–0.2)
nRBC: 0 % (ref 0.0–0.2)

## 2023-01-29 SURGERY — ESOPHAGOGASTRODUODENOSCOPY (EGD) WITH PROPOFOL
Anesthesia: Monitor Anesthesia Care

## 2023-01-29 MED ORDER — EPHEDRINE SULFATE-NACL 50-0.9 MG/10ML-% IV SOSY
PREFILLED_SYRINGE | INTRAVENOUS | Status: DC | PRN
  Administered 2023-01-29: 10 mg via INTRAVENOUS

## 2023-01-29 MED ORDER — PHENYLEPHRINE 80 MCG/ML (10ML) SYRINGE FOR IV PUSH (FOR BLOOD PRESSURE SUPPORT)
PREFILLED_SYRINGE | INTRAVENOUS | Status: DC | PRN
  Administered 2023-01-29: 80 ug via INTRAVENOUS

## 2023-01-29 MED ORDER — LACTATED RINGERS IV SOLN: INTRAVENOUS | Status: DC

## 2023-01-29 MED ORDER — PROPOFOL 1000 MG/100ML IV EMUL
INTRAVENOUS | Status: AC
  Filled 2023-01-29: qty 100

## 2023-01-29 MED ORDER — ACETAMINOPHEN 325 MG PO TABS: 650.0000 mg | ORAL_TABLET | Freq: Four times a day (QID) | ORAL | 0 refills | Status: DC | PRN

## 2023-01-29 MED ORDER — SODIUM CHLORIDE 0.9 % IV SOLN: INTRAVENOUS | Status: DC

## 2023-01-29 MED ORDER — PROPOFOL 10 MG/ML IV BOLUS
INTRAVENOUS | Status: DC | PRN
  Administered 2023-01-29 (×3): 10 mg via INTRAVENOUS

## 2023-01-29 MED ORDER — ONDANSETRON HCL 4 MG PO TABS: 4.0000 mg | ORAL_TABLET | Freq: Four times a day (QID) | ORAL | 0 refills | Status: DC | PRN

## 2023-01-29 MED ORDER — PROPOFOL 500 MG/50ML IV EMUL
INTRAVENOUS | Status: DC | PRN
  Administered 2023-01-29: 75 ug/kg/min via INTRAVENOUS

## 2023-01-29 MED ORDER — LIDOCAINE 2% (20 MG/ML) 5 ML SYRINGE
INTRAMUSCULAR | Status: DC | PRN
  Administered 2023-01-29: 80 mg via INTRAVENOUS

## 2023-01-29 MED ORDER — PROPOFOL 10 MG/ML IV BOLUS
INTRAVENOUS | Status: AC
  Filled 2023-01-29: qty 20

## 2023-01-29 SURGICAL SUPPLY — 15 items

## 2023-01-29 NOTE — Discharge Summary (Signed)
Physician Discharge Summary   Patient: Justin Mcgee MRN: [id number] DOB: [birth year]  Admit date:     01/28/2023  Discharge date: 01/29/23  Discharge Physician: Marguerita Merles, DO   PCP: Everrett Coombe, DO   Recommendations at discharge:  {Tip this will not be part of the note when signed- Example include specific recommendations for outpatient follow-up, pending tests to follow-up on. (Optional):26781}  ***  Discharge Diagnoses: Principal Problem:   Upper GI bleed Active Problems:   GERD (gastroesophageal reflux disease)   CKD (chronic kidney disease) stage 3, GFR 30-59 ml/min   Neuropathy, lumbosacral (radicular)   Gastric erosion   Coffee ground emesis  Resolved Problems:   BPH (benign prostatic hyperplasia)  Hospital Course: No notes on file  Assessment and Plan: * Upper GI bleed 88 year old male presenting with 1 day history of dark stool and coffee ground emesis in setting of chronic NSAID use consistent with upper GI bleed concerning for ulcer.  -obs to progressive -has had only one episode of emesis this AM and no more diarrhea since early this AM either -hgb stable -serial cbc q 6 hours -continue protonix drip -clear liquids then NPO at midnight -GI consulted, likely EGD tomorrow   GERD (gastroesophageal reflux disease) Only daily protonix, continue protonix gtt   CKD (chronic kidney disease) stage 3, GFR 30-59 ml/min Baseline creatinine 1.2-1.3, stable Continue to monitor   Neuropathy, lumbosacral (radicular) On daily celebrex Lyrica  at night       {Tip this will not be part of the note when signed Body mass index is 21.49 kg/m. , ,  (Optional):26781}  {(NOTE) Pain control PDMP Statment (Optional):26782} Consultants: *** Procedures performed: ***  Disposition: {Plan; Disposition:26390} Diet recommendation:  Discharge Diet Orders (From admission, onward)     Start     Ordered   01/29/23 0000  Diet general        01/29/23 1432            {Diet_Plan:26776} DISCHARGE MEDICATION: Allergies as of 01/29/2023       Reactions   Flomax [tamsulosin Hcl] Other (See Comments)   Dizziness   Gabapentin Other (See Comments)   Unsteadiness and Balance problems.     Sulfa Antibiotics Rash, Other (See Comments)   The topical versions ONLY        Medication List     STOP taking these medications    Advil PM 200-38 MG Tabs Generic drug: Ibuprofen-diphenhydrAMINE Cit   celecoxib 100 MG capsule Commonly known as: CELEBREX       TAKE these medications    acetaminophen 325 MG tablet Commonly known as: TYLENOL Take 2 tablets (650 mg total) by mouth every 6 (six) hours as needed for mild pain (or Fever >/= 101).   ketoconazole 2 % cream Commonly known as: NIZORAL Apply 1 application topically daily.   multivitamin with minerals Tabs tablet Take 1 tablet by mouth daily with breakfast.   ondansetron 4 MG tablet Commonly known as: ZOFRAN Take 1 tablet (4 mg total) by mouth every 6 (six) hours as needed for nausea.   pantoprazole 40 MG tablet Commonly known as: PROTONIX TAKE 1 TABLET BY MOUTH TWICE A DAY What changed: when to take this   pregabalin 100 MG capsule Commonly known as: LYRICA Take 1 capsule (100 mg total) by mouth 2 (two) times daily. What changed: when to take this        Discharge Exam: Filed Weights   01/28/23 1505 01/29/23 1213  Weight:  78 kg 78 kg   ***  Condition at discharge: {DC Condition:26389}  The results of significant diagnostics from this hospitalization (including imaging, microbiology, ancillary and laboratory) are listed below for reference.   Imaging Studies: No results found.  Microbiology: Results for orders placed or performed during the hospital encounter of 02/28/21  Surgical pcr screen     Status: None   Collection Time: 02/28/21 12:25 PM   Specimen: Nasal Mucosa; Nasal Swab  Result Value Ref Range Status   MRSA, PCR NEGATIVE NEGATIVE Final    Staphylococcus aureus NEGATIVE NEGATIVE Final    Comment: (NOTE) The Xpert SA Assay (FDA approved for NASAL specimens in patients 5 years of age and older), is one component of a comprehensive surveillance program. It is not intended to diagnose infection nor to guide or monitor treatment. Performed at [facility], [address], [place], [place] [address]     Labs: CBC: Recent Labs  Lab 01/28/23 1551 01/28/23 2315 01/29/23 0335 01/29/23 0635  WBC 9.2 8.0 7.7 7.6  HGB 13.4 11.7* 11.9* 11.6*  HCT 40.4 35.0* 35.6* 35.5*  MCV 91.8 92.3 92.2 92.7  PLT 148* 130* 124* 126*   Basic Metabolic Panel: Recent Labs  Lab 01/28/23 1551 01/29/23 0502  NA 136 138  K 3.8 4.0  CL 104 106  CO2 26 26  GLUCOSE 166* 107*  BUN 36* 30*  CREATININE 1.33* 1.17  CALCIUM 8.4* 7.9*   Liver Function Tests: Recent Labs  Lab 01/28/23 1551  AST 19  ALT 16  ALKPHOS 87  BILITOT 0.8  PROT 7.1  ALBUMIN 4.2   CBG: No results for input(s): "GLUCAP" in the last 168 hours.  Discharge time spent: {LESS THAN/GREATER UEAV:40981} 30 minutes.  Signed: Merlene Laughter, DO [facility] 01/29/2023

## 2023-01-29 NOTE — Transfer of Care (Signed)
Immediate Anesthesia Transfer of Care Note  Patient: Justin Mcgee  Procedure(s) Performed: ESOPHAGOGASTRODUODENOSCOPY (EGD) WITH PROPOFOL  Patient Location: Endoscopy Unit  Anesthesia Type:MAC  Level of Consciousness: drowsy and patient cooperative  Airway & Oxygen Therapy: Patient Spontanous Breathing and Patient connected to face mask oxygen  Post-op Assessment: Report given to RN and Post -op Vital signs reviewed and stable  Post vital signs: Reviewed and stable  Last Vitals:  Vitals Value Taken Time  BP    Temp    Pulse    Resp    SpO2      Last Pain:  Vitals:   01/29/23 1213  TempSrc: Temporal  PainSc: 0-No pain         Complications: No notable events documented.

## 2023-01-29 NOTE — Evaluation (Signed)
Physical Therapy Evaluation Patient Details Name: Justin Mcgee MRN: [id number] DOB: July 20, 1935 Today's Date: 01/29/2023  History of Present Illness  Pt is 88 y.o. male presented 01/28/23 with coffee ground emesis consistent with upper GI bleed and now s/p upper GI endoscopy on 01/29/23. PMH significant for SCC of tongue, BPH, CKD, hearing loss, neuropathy.   Clinical Impression  Pt admitted with above diagnosis.  PT with orders to see pt in endoscopy recovery prior to d/c.  At baseline, pt independent and lives with family.  He does have some L LE weakness and unsteadiness at baseline per family.  Today, pt able to transfers and ambulate safely.  He required initial min guard progressed to supervision level and ambulated 200' and able to perform some light balance challenges. Pt appears to be at baseline and eager to return home. He will have home support and has RW or cane if needed.      Recommendations for follow up therapy are one component of a multi-disciplinary discharge planning process, led by the attending physician.  Recommendations may be updated based on patient status, additional functional criteria and insurance authorization.  Follow Up Recommendations       Assistance Recommended at Discharge PRN  Patient can return home with the following  Assistance with cooking/housework;Help with stairs or ramp for entrance    Equipment Recommendations None recommended by PT  Recommendations for Other Services       Functional Status Assessment Patient has not had a recent decline in their functional status     Precautions / Restrictions Precautions Precautions: Fall      Mobility  Bed Mobility Overal bed mobility: Modified Independent                  Transfers Overall transfer level: Needs assistance Equipment used: None Transfers: Sit to/from Stand Sit to Stand: Supervision           General transfer comment: Performed from low chair and stretcher     Ambulation/Gait Ambulation/Gait assistance: Supervision Gait Distance (Feet): 200 Feet Assistive device: None, Rolling walker (2 wheels) Gait Pattern/deviations: WFL(Within Functional Limits) Gait velocity: normal     General Gait Details: Ambulated with and without RW.  Pt tolerated both well without LOB.  Noted that L LE does near scissor occasionally - reports baseline and family confirms  Information systems manager Rankin (Stroke Patients Only)       Balance Overall balance assessment: Modified Independent   Sitting balance-Leahy Scale: Normal     Standing balance support: No upper extremity supported Standing balance-Leahy Scale: Good                 High Level Balance Comments: Able to turn, stop, look up/down/L/R, change speeds, and march without LOB             Pertinent Vitals/Pain Pain Assessment Pain Assessment: No/denies pain    Home Living Family/patient expects to be discharged to:: Private residence Living Arrangements: Spouse/significant other Available Help at Discharge: Family;Available 24 hours/day (lives with wife but son and dtr will stay initially) Type of Home: House Home Access: Stairs to enter Entrance Stairs-Rails: Right;Left;Can reach both Entrance Stairs-Number of Steps: 6   Home Layout: Multi-level;Able to live on main level with bedroom/bathroom Home Equipment: Rolling Walker (2 wheels);Cane - single point      Prior Function Prior Level of Function : Independent/Modified Independent  Mobility Comments: reports community ambulation without AD; unsteady at times due to L LE weakness; reports 2 falls in last 6 months going down stairs and missed a step ADLs Comments: independent adls and light iadls     Hand Dominance        Extremity/Trunk Assessment   Upper Extremity Assessment Upper Extremity Assessment: Defer to OT evaluation    Lower Extremity Assessment Lower  Extremity Assessment: LLE deficits/detail;RLE deficits/detail RLE Deficits / Details: ROM WFL ; MMT 5/5 LLE Deficits / Details: ROM : WFL but L ankle less dorsiflexion than R; MMT: 5/5 except L ankle DF 4/5; baseline per pt and family    Cervical / Trunk Assessment Cervical / Trunk Assessment: Normal  Communication   Communication: HOH  Cognition Arousal/Alertness: Awake/alert Behavior During Therapy: WFL for tasks assessed/performed Overall Cognitive Status: Within Functional Limits for tasks assessed                                 General Comments: pleasant and conversational        General Comments General comments (skin integrity, edema, etc.): VSS daughter in law an Charity fundraiser and reporting she will keep an eye on pt today    Exercises     Assessment/Plan    PT Assessment Patient does not need any further PT services  PT Problem List         PT Treatment Interventions      PT Goals (Current goals can be found in the Care Plan section)  Acute Rehab PT Goals Patient Stated Goal: return home PT Goal Formulation: All assessment and education complete, DC therapy    Frequency       Co-evaluation               AM-PAC PT "6 Clicks" Mobility  Outcome Measure Help needed turning from your back to your side while in a flat bed without using bedrails?: None Help needed moving from lying on your back to sitting on the side of a flat bed without using bedrails?: None Help needed moving to and from a bed to a chair (including a wheelchair)?: None Help needed standing up from a chair using your arms (e.g., wheelchair or bedside chair)?: None Help needed to walk in hospital room?: A Little Help needed climbing 3-5 steps with a railing? : A Little 6 Click Score: 22    End of Session Equipment Utilized During Treatment: Gait belt Activity Tolerance: Patient tolerated treatment well Patient left: in chair (in endoscopy area) Nurse Communication: Mobility  status PT Visit Diagnosis: Unsteadiness on feet (R26.81)    Time: 1610-9604 PT Time Calculation (min) (ACUTE ONLY): 15 min   Charges:   PT Evaluation $PT Eval Low Complexity: 1 Low          Tyion Boylen, PT Acute Rehab [facility] [phone]   Rayetta Humphrey 01/29/2023, 3:52 PM

## 2023-01-29 NOTE — Evaluation (Signed)
Occupational Therapy Evaluation Patient Details Name: Justin Mcgee MRN: [id number] DOB: 08/17/1935 Today's Date: 01/29/2023   History of Present Illness Pt is 88 y.o. male presented 01/28/23 with coffee ground emesis consistent with upper GI bleed and now s/p upper GI endoscopy on 01/29/23. PMH significant for SCC of tongue, BPH, CKD, hearing loss, neuropathy.   Clinical Impression   PTA, pt lived with his wife and was independent. Upon eval, pt performing ADL with up to min guard A as well as functional mobility and transfers. Pt requiring min A 1x when initiating functional mobility; suspect due to continued recovery from procedure. Improving with distance to no hands on assist. Able to perform tub transfer with min guard A. Suspect pt near baseline. Recommended shower seat and daughter in law Banker) reports that they will follow up with PCP to obtain one. No further acute needs identified and daughter in law reports she will continue to monitor pt at home.      Recommendations for follow up therapy are one component of a multi-disciplinary discharge planning process, led by the attending physician.  Recommendations may be updated based on patient status, additional functional criteria and insurance authorization.   Assistance Recommended at Discharge Intermittent Supervision/Assistance  Patient can return home with the following A little help with walking and/or transfers;A little help with bathing/dressing/bathroom;Assistance with cooking/housework;Assist for transportation;Help with stairs or ramp for entrance    Functional Status Assessment  Patient has had a recent decline in their functional status and demonstrates the ability to make significant improvements in function in a reasonable and predictable amount of time.  Equipment Recommendations  Tub/shower seat;Other (comment) (Pt's daughter in law reporting they will request from PCP rather than receiving as a result of this visit to [facility].  Recommended wait to shower until has obtained seat)    Recommendations for Other Services       Precautions / Restrictions Precautions Precautions: Fall      Mobility Bed Mobility Overal bed mobility: Modified Independent                  Transfers Overall transfer level: Needs assistance Equipment used: None Transfers: Sit to/from Stand Sit to Stand: Supervision           General transfer comment: from lower surfaces, benefits from supervision-min guard A.      Balance Overall balance assessment: Mild deficits observed, not formally tested                                         ADL either performed or assessed with clinical judgement   ADL Overall ADL's : Needs assistance/impaired Eating/Feeding: Independent   Grooming: Supervision/safety;Standing   Upper Body Bathing: Set up;Sitting   Lower Body Bathing: Supervison/ safety;Sit to/from stand   Upper Body Dressing : Set up;Sitting   Lower Body Dressing: Supervision/safety;Sit to/from stand   Toilet Transfer: Min guard;Ambulation;Regular Teacher, adult education Details (indicate cue type and reason): min guard A for safety     Tub/ Shower Transfer: Tub transfer;Min guard;Ambulation Tub/Shower Transfer Details (indicate cue type and reason): Supporting self on wall Functional mobility during ADLs: Min guard;Minimal assistance General ADL Comments: Up to min A initially due to one LOB when initiating functional ambulation     Vision Ability to See in Adequate Light: 0 Adequate Patient Visual Report: No change from baseline Vision Assessment?: No apparent visual deficits  Perception Perception Perception Tested?: No   Praxis Praxis Praxis tested?: Within functional limits    Pertinent Vitals/Pain Pain Assessment Pain Assessment: No/denies pain     Hand Dominance     Extremity/Trunk Assessment Upper Extremity Assessment Upper Extremity Assessment: Overall WFL for  tasks assessed (mild ROM deficits at shoulder. Able to perform FF to ~110 degreees)   Lower Extremity Assessment Lower Extremity Assessment: Defer to PT evaluation   Cervical / Trunk Assessment Cervical / Trunk Assessment: Normal   Communication Communication Communication: No difficulties;HOH   Cognition Arousal/Alertness: Awake/alert Behavior During Therapy: WFL for tasks assessed/performed Overall Cognitive Status: Within Functional Limits for tasks assessed                                 General Comments: pleasant and conversational     General Comments  VSS daughter in law an Charity fundraiser and reporting she will keep an eye on pt today    Exercises     Shoulder Instructions      Home Living Family/patient expects to be discharged to:: Private residence Living Arrangements: Spouse/significant other Available Help at Discharge: Family Type of Home: House Home Access: Stairs to enter Secretary/administrator of Steps: 6 Entrance Stairs-Rails: Right;Left;Can reach both;None Home Layout: Multi-level;Able to live on main level with bedroom/bathroom (3 levels)     Bathroom Shower/Tub: Tub/shower unit         Home Equipment: Cane - single point          Prior Functioning/Environment Prior Level of Function : Independent/Modified Independent;Driving;History of Falls (last six months)             Mobility Comments: intermittent use of cane. fall down stairs near christmas ADLs Comments: indep in ADL and IADL        OT Problem List: Decreased strength;Decreased activity tolerance;Impaired balance (sitting and/or standing)      OT Treatment/Interventions:      OT Goals(Current goals can be found in the care plan section) Acute Rehab OT Goals Patient Stated Goal: go home OT Goal Formulation: With patient  OT Frequency:      Co-evaluation              AM-PAC OT "6 Clicks" Daily Activity     Outcome Measure Help from another person eating  meals?: None Help from another person taking care of personal grooming?: A Little Help from another person toileting, which includes using toliet, bedpan, or urinal?: A Little Help from another person bathing (including washing, rinsing, drying)?: A Little Help from another person to put on and taking off regular upper body clothing?: A Little Help from another person to put on and taking off regular lower body clothing?: A Little 6 Click Score: 19   End of Session Equipment Utilized During Treatment: Gait belt Nurse Communication: Mobility status  Activity Tolerance: Patient tolerated treatment well Patient left: in bed;with call bell/phone within reach;with nursing/sitter in room;with family/visitor present  OT Visit Diagnosis: Unsteadiness on feet (R26.81);Muscle weakness (generalized) (M62.81);History of falling (Z91.81)                Time: 9528-4132 OT Time Calculation (min): 22 min Charges:  OT General Charges $OT Visit: 1 Visit OT Evaluation $OT Eval Low Complexity: 1 Low  Tyler Deis, OTR/L [facility] Acute Rehabilitation Office: [phone]   Myrla Halsted 01/29/2023, 3:46 PM

## 2023-01-29 NOTE — Op Note (Signed)
[facility] Patient Name: Justin Mcgee Procedure Date: 01/29/2023 MRN: [id number] Attending MD: Wilhemina Bonito. Marina Goodell , MD, [id number] Date of Birth: 04/12/1935 CSN: [id number] Age: 88 Admit Type: Outpatient Procedure:                Upper GI endoscopy Indications:              Coffee-ground emesis Providers:                Wilhemina Bonito. Marina Goodell, MD, Pollie Friar RN, RN, Kandice Robinsons, Technician Referring MD:             [facility] hospitalist Medicines:                Monitored Anesthesia Care Complications:            No immediate complications. Estimated Blood Loss:     Estimated blood loss: none. Procedure:                Pre-Anesthesia Assessment:                           - Prior to the procedure, a History and Physical                            was performed, and patient medications and                            allergies were reviewed. The patient's tolerance of                            previous anesthesia was also reviewed. The risks                            and benefits of the procedure and the sedation                            options and risks were discussed with the patient.                            All questions were answered, and informed consent                            was obtained. Prior Anticoagulants: The patient has                            taken no anticoagulant or antiplatelet agents. ASA                            Grade Assessment: III - A patient with severe                            systemic disease. After reviewing the risks and  benefits, the patient was deemed in satisfactory                            condition to undergo the procedure.                           After obtaining informed consent, the endoscope was                            passed under direct vision. Throughout the                            procedure, the patient's blood pressure, pulse, and                             oxygen saturations were monitored continuously. The                            [id number]-[id number] ([id number]) Olympus endoscope was introduced                            through the mouth, and advanced to the third part                            of duodenum. The upper GI endoscopy was                            accomplished without difficulty. The patient                            tolerated the procedure well. Scope In: Scope Out: Findings:      The esophagus revealed a large caliber distal esophageal ring, but was       otherwise normal.      The stomach revealed a small hiatal hernia. There were a few benign       fundic gland type polyps which were small. A few tiny erosions.       Otherwise normal. No active bleeding. No stigmata of recent bleed..      The examined duodenum was normal.      The cardia and gastric fundus were normal on retroflexion. Impression:               1. Incidental esophageal ring                           2. Hiatal hernia, small gastric polyps, few small                            erosions.                           3. No significant GI mucosal pathology                           4. 1 episode of minor coffee-ground emesis. Moderate Sedation:      none Recommendation:  1. Resume previous diet                           2. Resume previous medications                           3. Continue on daily PPI indefinitely                           4. Okay for discharge from GI perspective                           Discussed with the patient and his wife. They were                            given a copy of this procedure report.                           . Procedure Code(s):        --- Professional ---                           [phone], Esophagogastroduodenoscopy, flexible,                            transoral; diagnostic, including collection of                            specimen(s) by brushing or washing, when performed                            (separate  procedure) Diagnosis Code(s):        --- Professional ---                           K92.0, Hematemesis CPT copyright 2022 American Medical Association. All rights reserved. The codes documented in this report are preliminary and upon coder review may  be revised to meet current compliance requirements. Wilhemina Bonito. Marina Goodell, MD 01/29/2023 1:41:45 PM This report has been signed electronically. Number of Addenda: 0

## 2023-01-29 NOTE — Anesthesia Preprocedure Evaluation (Addendum)
Anesthesia Evaluation  Patient identified by MRN, date of birth, ID band Patient awake    Reviewed: Allergy & Precautions, NPO status , Patient's Chart, lab work & pertinent test results  Airway Mallampati: II  TM Distance: >3 FB     Dental  (+) Dental Advisory Given   Pulmonary former smoker   breath sounds clear to auscultation       Cardiovascular negative cardio ROS  Rhythm:Regular Rate:Normal  TTE 2018 Study Conclusions   - Left ventricle: The cavity size was normal. Systolic function was    normal. The estimated ejection fraction was in the range of 60%    to 65%. Wall motion was normal; there were no regional wall    motion abnormalities. Left ventricular diastolic function    parameters were normal.  - Aortic valve: There was mild regurgitation.  - Mitral valve: There was mild regurgitation.  - Left atrium: The atrium was mildly dilated.  - Right ventricle: RV mid and basal diameter is dilated on apical 4    chamber viewes.  - Atrial septum: No defect or patent foramen ovale was identified.  - Pulmonary arteries: PA peak pressure: 36 mm Hg (S).      Neuro/Psych  Neuromuscular disease    GI/Hepatic Neg liver ROS,GERD  Medicated,,  Endo/Other  negative endocrine ROS    Renal/GU Renal disease     Musculoskeletal  (+) Arthritis ,    Abdominal   Peds  Hematology  (+) Blood dyscrasia, anemia   Anesthesia Other Findings   Reproductive/Obstetrics                             Lab Results  Component Value Date   WBC 7.6 01/29/2023   HGB 11.6 (L) 01/29/2023   HCT 35.5 (L) 01/29/2023   MCV 92.7 01/29/2023   PLT 126 (L) 01/29/2023   Lab Results  Component Value Date   CREATININE 1.17 01/29/2023   BUN 30 (H) 01/29/2023   NA 138 01/29/2023   K 4.0 01/29/2023   CL 106 01/29/2023   CO2 26 01/29/2023    Anesthesia Physical Anesthesia Plan  ASA: 2  Anesthesia Plan: MAC    Post-op Pain Management:  Regional for Post-op pain and Minimal or no pain anticipated   Induction:   PONV Risk Score and Plan: 1 and Propofol infusion, Ondansetron and Treatment may vary due to age or medical condition  Airway Management Planned: Natural Airway and Simple Face Mask  Additional Equipment:   Intra-op Plan:   Post-operative Plan:   Informed Consent: I have reviewed the patients History and Physical, chart, labs and discussed the procedure including the risks, benefits and alternatives for the proposed anesthesia with the patient or authorized representative who has indicated his/her understanding and acceptance.     Dental advisory given  Plan Discussed with: Anesthesiologist and CRNA  Anesthesia Plan Comments:         Anesthesia Quick Evaluation

## 2023-01-29 NOTE — Consult Note (Addendum)
Consultation  Referring Provider:   Dr. Silverio Lay, ED Primary Care Physician:  Everrett Coombe, DO Primary Gastroenterologist:  Dr. Christella Hartigan       Reason for Consultation:   CGE           HPI:   Justin Mcgee is a 88 y.o. male with past medical history significant for CKD, peripheral neuropathy HOH, history of squamous cell carcinoma of the tongue, BCC of skin, cervical lymphadenopathy, CKD stage III, GERD presents with coffee-ground emesis.  08/16/2015 EGD with Dr. Christella Hartigan for dysphagia showed 2 cm HH, torturous esophagus, GE junction normal.  Supposed to be set up for barium swallow.  Patient was on Protonix twice daily but decrease down to once daily a month ago.  Started to have dysphagia with pills 1 month ago needing increase of fluid.  Denies any issues with food or liquids.  Denies any odynophagia. Patient began to have worsening reflux for several days and then began to have melanic diarrhea every 20 minutes until it became clear with black specks. He did have 1 episode of large-volume coffee-ground appearing emesis. Denies abdominal pain, fever, chills. Denies any unintentional weight loss, states over the 10 years she has been retired he is decreased to 50 pounds but he is eating better. Patient is on 40 mg PPI.  Takes Celebrex 100 mg twice daily for neuropathy, in addition to 2 Advil PM at night. Denies tobacco use, alcohol use.  No family was present at the time of my evaluation.  Abnormal ED labs: Temperature 99.6, otherwise HD stable in the ER.  BUN 36 increased from 21, creatinine 1.33 stable.  Emesis tested for blood positive FOBT. Had mild thrombocytopenia platelets 148. Today Hgb went from 13.4 ultrasound baseline to 11.6, platelets 126.  No prior imaging of abdomen. Started on Protonix drip. Abnormal Labs Reviewed  COMPREHENSIVE METABOLIC PANEL - Abnormal; Notable for the following components:      Result Value   Glucose, Bld 166 (*)    BUN 36 (*)    Creatinine,  Ser 1.33 (*)    Calcium 8.4 (*)    GFR, Estimated 52 (*)    All other components within normal limits  CBC - Abnormal; Notable for the following components:   Platelets 148 (*)    All other components within normal limits  URINALYSIS, ROUTINE W REFLEX MICROSCOPIC - Abnormal; Notable for the following components:   Protein, ur 30 (*)    All other components within normal limits  CBC - Abnormal; Notable for the following components:   RBC 3.79 (*)    Hemoglobin 11.7 (*)    HCT 35.0 (*)    Platelets 130 (*)    All other components within normal limits  CBC - Abnormal; Notable for the following components:   RBC 3.86 (*)    Hemoglobin 11.9 (*)    HCT 35.6 (*)    Platelets 124 (*)    All other components within normal limits  CBC - Abnormal; Notable for the following components:   RBC 3.83 (*)    Hemoglobin 11.6 (*)    HCT 35.5 (*)    Platelets 126 (*)    All other components within normal limits  BASIC METABOLIC PANEL - Abnormal; Notable for the following components:   Glucose, Bld 107 (*)    BUN 30 (*)    Calcium 7.9 (*)    All other components within normal limits  POC OCCULT BLOOD, ED - Abnormal; Notable  for the following components:   Fecal Occult Bld POSITIVE (*)    All other components within normal limits     Past Medical History:  Diagnosis Date   Arthritis    Basal cell carcinoma of antihelix of ear    right and left   Former smoker    Fracture of right radius 1956   Fracture, metacarpal 1956   1, 2, 3   Fracture, tibia 1964   Squamous cell cancer of buccal mucosa 2004   Dr. Derryl Harbor, no radiation or chemo, clear LN.     Surgical History:  He  has a past surgical history that includes Cholecystectomy (05/16/10); Appendectomy (1997); Inguinal hernia repair (Right, 1965, 1968); Inguinal hernia repair (Left, 1994); Partial glossectomy; Cataract extraction; and Total knee arthroplasty (Left, 03/07/2021). Family History:  His family history includes Colon cancer in  his mother; Diabetes in his father and another family member; GER disease in his father; Other in his father. Social History:   reports that he quit smoking about 44 years ago. His smoking use included cigarettes. He has never used smokeless tobacco. He reports that he does not drink alcohol and does not use drugs.  Prior to Admission medications   Medication Sig Start Date End Date Taking? Authorizing Provider  ADVIL PM 200-38 MG TABS Take 2 tablets by mouth at bedtime.   Yes [provider]  Multiple Vitamin (MULTIVITAMIN WITH MINERALS) TABS tablet Take 1 tablet by mouth daily with breakfast.   Yes [provider]  pantoprazole (PROTONIX) 40 MG tablet TAKE 1 TABLET BY MOUTH TWICE A DAY Patient taking differently: Take 40 mg by mouth daily before breakfast. 07/27/22  Yes Everrett Coombe, DO  pregabalin (LYRICA) 100 MG capsule Take 1 capsule (100 mg total) by mouth 2 (two) times daily. Patient taking differently: Take 100 mg by mouth at bedtime. 12/10/21 01/28/23 Yes Everrett Coombe, DO  celecoxib (CELEBREX) 100 MG capsule TAKE 2 CAPSULES DAILY WITH BREAKFAST Patient not taking: Reported on 01/28/2023 12/08/21   Monica Becton, MD  ketoconazole (NIZORAL) 2 % cream Apply 1 application topically daily. Patient not taking: Reported on 01/28/2023 07/28/21   Everrett Coombe, DO    Current Facility-Administered Medications  Medication Dose Route Frequency Provider Last Rate Last Admin   0.9 %  sodium chloride infusion   Intravenous Continuous Orland Mustard, MD 75 mL/hr at 01/29/23 0752 Rate Verify at 01/29/23 0752   acetaminophen (TYLENOL) tablet 650 mg  650 mg Oral Q6H PRN Orland Mustard, MD       Or   acetaminophen (TYLENOL) suppository 650 mg  650 mg Rectal Q6H PRN Orland Mustard, MD       ondansetron [facility]) tablet 4 mg  4 mg Oral Q6H PRN Orland Mustard, MD       Or   ondansetron [facility]) injection 4 mg  4 mg Intravenous Q6H PRN Orland Mustard, MD       pantoprozole  (PROTONIX) 80 mg /NS 100 mL infusion  8 mg/hr Intravenous Continuous Charlynne Pander, MD 10 mL/hr at 01/29/23 0326 8 mg/hr at 01/29/23 0326   pregabalin (LYRICA) capsule 100 mg  100 mg Oral QHS Orland Mustard, MD   100 mg at 01/28/23 2259   Current Outpatient Medications  Medication Sig Dispense Refill   ADVIL PM 200-38 MG TABS Take 2 tablets by mouth at bedtime.     Multiple Vitamin (MULTIVITAMIN WITH MINERALS) TABS tablet Take 1 tablet by mouth daily with breakfast.     pantoprazole (  PROTONIX) 40 MG tablet TAKE 1 TABLET BY MOUTH TWICE A DAY (Patient taking differently: Take 40 mg by mouth daily before breakfast.) 180 tablet 4   pregabalin (LYRICA) 100 MG capsule Take 1 capsule (100 mg total) by mouth 2 (two) times daily. (Patient taking differently: Take 100 mg by mouth at bedtime.) 60 capsule 2   celecoxib (CELEBREX) 100 MG capsule TAKE 2 CAPSULES DAILY WITH BREAKFAST (Patient not taking: Reported on 01/28/2023) 180 capsule 0   ketoconazole (NIZORAL) 2 % cream Apply 1 application topically daily. (Patient not taking: Reported on 01/28/2023) 30 g 0    Allergies as of 01/28/2023 - Review Complete 01/28/2023  Allergen Reaction Noted   Flomax [tamsulosin hcl] Other (See Comments) 02/02/2017   Gabapentin Other (See Comments) 02/03/2018   Sulfa antibiotics Rash and Other (See Comments) 11/22/2011    Review of Systems:    Constitutional: No weight loss, fever, chills, weakness or fatigue HEENT: Eyes: No change in vision               Ears, Nose, Throat:  No change in hearing or congestion Skin: No rash or itching Cardiovascular: No chest pain, chest pressure or palpitations   Respiratory: No SOB or cough Gastrointestinal: See HPI and otherwise negative Genitourinary: No dysuria or change in urinary frequency Neurological: No headache, dizziness or syncope Musculoskeletal: No new muscle or joint pain Hematologic: No bleeding or bruising Psychiatric: No history of depression or anxiety      Physical Exam:  Vital signs in last 24 hours: Temp:  [98.5 F (36.9 C)-99.6 F (37.6 C)] 98.5 F (36.9 C) (04/19 0640) Pulse Rate:  [64-75] 65 (04/19 0600) Resp:  [18-25] 19 (04/19 0600) BP: (100-142)/(58-77) 100/59 (04/19 0600) SpO2:  [91 %-97 %] 92 % (04/19 0600) Weight:  [78 kg] 78 kg (04/18 1505) Last BM Date : 01/28/23 Last BM recorded by nurses in past 5 days No data recorded  General:   Pleasant, elderly male in no acute distress Head:  Normocephalic and atraumatic.  Left neck with previous surgical scar. Eyes: sclerae anicteric,conjunctive pink  Heart:  regular rate and rhythm, no murmurs or gallops Pulm: Clear anteriorly; no wheezing Abdomen:  Soft, Non-distended AB, Active bowel sounds. No tenderness . Without guarding and Without rebound, No organomegaly appreciated. Extremities:  Without edema. Msk:  Symmetrical without gross deformities. Peripheral pulses intact.  Neurologic:  Alert and  oriented x4;  No focal deficits.  Skin:   Dry and intact without significant lesions or rashes. Psychiatric:  Cooperative. Normal mood and affect.  LAB RESULTS: Recent Labs    01/28/23 2315 01/29/23 0335 01/29/23 0635  WBC 8.0 7.7 7.6  HGB 11.7* 11.9* 11.6*  HCT 35.0* 35.6* 35.5*  PLT 130* 124* 126*   BMET Recent Labs    01/28/23 1551 01/29/23 0502  NA 136 138  K 3.8 4.0  CL 104 106  CO2 26 26  GLUCOSE 166* 107*  BUN 36* 30*  CREATININE 1.33* 1.17  CALCIUM 8.4* 7.9*   LFT Recent Labs    01/28/23 1551  PROT 7.1  ALBUMIN 4.2  AST 19  ALT 16  ALKPHOS 87  BILITOT 0.8   PT/INR No results for input(s): "LABPROT", "INR" in the last 72 hours.  STUDIES: No results found.    Impression    CGE with mild hypotension In the setting of NSAIDS ( celebrex and advil), no blood thinners, history of SCC of tongue HGB 11.6 MCV 92.7 Platelets 126 BUN 30 Cr 1.17 EGD  2016 for dysphagia 2 cm hiatal hernia, torturous esophagus otherwise unremarkable Patient did  have isolated elevation of BUN Hemoglobin from 13.4-11.9  Thrombocytopenia Slight decrease 3 years ago, currently 126 No history of alcohol use No abdominal imaging, normal LFTs  CKD stage III IVF per primary   Principal Problem:   Upper GI bleed Active Problems:   GERD (gastroesophageal reflux disease)   Neuropathy, lumbosacral (radicular)   CKD (chronic kidney disease) stage 3, GFR 30-59 ml/min    LOS: 0 days     Plan   -Protonix 40 mg IV BID. --Continue to monitor H&H with transfusion as needed to maintain hemoglobin greater than 7. - NPO now -EGD today with Dr. Marina Goodell to rule out PUD, gastritis, esophagitis, malignancy. I thoroughly discussed the procedure to include nature, alternatives, benefits, and risks including but not limited to bleeding, perforation, infection, anesthesia/cardiac and pulmonary complications. Patient provides understanding and gave verbal consent to proceed. - consider AB [place] due to thrombocytopenia, no LFT elevation or ETOH history   Thank you for your kind consultation, we will continue to follow.   Doree Albee  01/29/2023, 8:04 AM  GI ATTENDING  History, laboratories, x-rays, prior endoscopy reports reviewed.  Personally seen and examined.  Agree with comprehensive consultation note as outlined above.  The patient presents with hemodynamically stable upper GI bleed.  Plan for urgent upper endoscopy today.  The patient is high risk given his age and comorbidities.The nature of the procedure, as well as the risks, benefits, and alternatives were carefully and thoroughly reviewed with the patient. Ample time for discussion and questions allowed. The patient understood, was satisfied, and agreed to proceed.  Wilhemina Bonito. Eda Keys., M.D. [facility] of Gastroenterology

## 2023-01-31 NOTE — Anesthesia Postprocedure Evaluation (Signed)
Anesthesia Post Note  Patient: Justin Mcgee  Procedure(s) Performed: ESOPHAGOGASTRODUODENOSCOPY (EGD) WITH PROPOFOL     Patient location during evaluation: Endoscopy Anesthesia Type: MAC Level of consciousness: awake and alert Pain management: pain level controlled Vital Signs Assessment: post-procedure vital signs reviewed and stable Respiratory status: spontaneous breathing and respiratory function stable Cardiovascular status: stable Postop Assessment: no apparent nausea or vomiting Anesthetic complications: no  No notable events documented.  Last Vitals:  Vitals:   01/29/23 1419 01/29/23 1432  BP: (!) 113/57 (!) 104/44  Pulse: 65 66  Resp: 17 20  Temp:    SpO2: 95% 94%    Last Pain:  Vitals:   01/29/23 1432  TempSrc:   PainSc: 0-No pain                 Ella Guillotte DANIEL

## 2023-02-01 ENCOUNTER — Encounter (HOSPITAL_COMMUNITY): Payer: Self-pay | Admitting: Internal Medicine

## 2023-03-23 ENCOUNTER — Other Ambulatory Visit: Payer: Self-pay

## 2023-03-23 DIAGNOSIS — M5417 Radiculopathy, lumbosacral region: Secondary | ICD-10-CM

## 2023-03-23 MED ORDER — PREGABALIN 100 MG PO CAPS: 100.0000 mg | ORAL_CAPSULE | Freq: Every day | ORAL | 0 refills | Status: DC

## 2023-03-23 MED ORDER — PREGABALIN 100 MG PO CAPS
100.0000 mg | ORAL_CAPSULE | Freq: Every day | ORAL | 0 refills | Status: DC
Start: 2023-03-23 — End: 2023-03-23

## 2023-03-29 ENCOUNTER — Ambulatory Visit (INDEPENDENT_AMBULATORY_CARE_PROVIDER_SITE_OTHER): Payer: Medicare Other | Admitting: Family Medicine

## 2023-03-29 VITALS — Wt 175.0 lb

## 2023-03-29 DIAGNOSIS — Z0000 Encounter for general adult medical examination without abnormal findings: Secondary | ICD-10-CM | POA: Diagnosis not present

## 2023-03-29 NOTE — Progress Notes (Signed)
MEDICARE ANNUAL WELLNESS VISIT  03/29/2023  Telephone Visit Disclaimer This Medicare AWV was conducted by telephone due to national recommendations for restrictions regarding the COVID-19 Pandemic (e.g. social distancing).  I verified, using two identifiers, that I am speaking with Justin Mcgee or their authorized healthcare agent. I discussed the limitations, risks, security, and privacy concerns of performing an evaluation and management service by telephone and the potential availability of an in-person appointment in the future. The patient expressed understanding and agreed to proceed.  Location of Patient: Home Location of Provider (nurse):  In the office.  Subjective:    Justin Mcgee is a 88 y.o. male patient of Everrett Coombe, DO who had a Medicare Annual Wellness Visit today via telephone. Justin Mcgee is Retired and lives with their spouse. he has 2 children. he reports that he is socially active and does interact with friends/family regularly. he is moderately physically active and enjoys fishing.  Patient Care Team: Everrett Coombe, DO as PCP - General (Family Medicine)     03/29/2023    9:37 AM 01/29/2023   12:12 PM 01/28/2023    8:02 PM 02/28/2021   11:18 AM 12/26/2019    3:12 PM 02/04/2018    9:16 AM 06/01/2016   11:06 PM  Advanced Directives  Does Patient Have a Medical Advance Directive? No No No No No No No  Would patient like information on creating a medical advance directive? No - Patient declined No - Patient declined   No - Patient declined No - Patient declined     Hospital Utilization Over the Past 12 Months: # of hospitalizations or ER visits: 1 # of surgeries: 0  Review of Systems    Patient reports that his overall health is unchanged compared to last year.  History obtained from chart review and the patient  Patient Reported Readings (BP, Pulse, CBG, Weight, etc) none  Pain Assessment Pain : No/denies pain     Current Medications & Allergies  (verified) Allergies as of 03/29/2023       Reactions   Flomax [tamsulosin Hcl] Other (See Comments)   Dizziness   Gabapentin Other (See Comments)   Unsteadiness and Balance problems.     Sulfa Antibiotics Rash, Other (See Comments)   The topical versions ONLY        Medication List        Accurate as of March 29, 2023  9:48 AM. If you have any questions, ask your nurse or doctor.          STOP taking these medications    ondansetron 4 MG tablet Commonly known as: ZOFRAN       TAKE these medications    acetaminophen 325 MG tablet Commonly known as: TYLENOL Take 2 tablets (650 mg total) by mouth every 6 (six) hours as needed for mild pain (or Fever >/= 101).   ketoconazole 2 % cream Commonly known as: NIZORAL Apply 1 application topically daily.   multivitamin with minerals Tabs tablet Take 1 tablet by mouth daily with breakfast.   pantoprazole 40 MG tablet Commonly known as: PROTONIX TAKE 1 TABLET BY MOUTH TWICE A DAY What changed: when to take this   pregabalin 100 MG capsule Commonly known as: LYRICA Take 1 capsule (100 mg total) by mouth at bedtime.        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 Derryl Harbor, no radiation or chemo, clear LN.    Past Surgical History:  Procedure Laterality Date   APPENDECTOMY  1997   CATARACT EXTRACTION     CHOLECYSTECTOMY  05/16/10   in [place] while on vacation   ESOPHAGOGASTRODUODENOSCOPY (EGD) WITH PROPOFOL N/A 01/29/2023   Procedure: ESOPHAGOGASTRODUODENOSCOPY (EGD) WITH PROPOFOL;  Surgeon: Hilarie Fredrickson, MD;  Location: [facility];  Service: Gastroenterology;  Laterality: N/A;   INGUINAL HERNIA REPAIR Right 1965, 1968   INGUINAL HERNIA REPAIR Left  1994   PARTIAL GLOSSECTOMY     with unilat neck dissection   TOTAL KNEE ARTHROPLASTY Left 03/07/2021   Procedure: TOTAL KNEE ARTHROPLASTY;  Surgeon: Jodi Geralds, MD;  Location: [facility];  Service: Orthopedics;  Laterality: Left;   Family History  Problem Relation Age of Onset   GER disease Father    Other Father        G[place]-Barre   Diabetes Father    Colon cancer Mother    Diabetes Other    Social History   Socioeconomic History   Marital status: Married    Spouse name: Jerardo Markwood    Number of children: 2   Years of education: 14   Highest education level: Some college, no degree  Occupational History   Occupation: Research officer, political party: [employer]   Occupation: Retired  Tobacco Use   Smoking status: Former    Types: Cigarettes    Quit date: 02/19/1978    Years since quitting: 45.1   Smokeless tobacco: Never  Vaping Use   Vaping Use: Never used  Substance and Sexual Activity   Alcohol use: No    Alcohol/week: 0.0 standard drinks of alcohol   Drug use: No   Sexual activity: Yes    Partners: Female  Other Topics Concern   Not on file  Social History Narrative   Lives with spouse. He has two children. He enjoys fishing.   Social Determinants of Health   Financial Resource Strain: Low Risk  (03/29/2023)   Overall Financial Resource Strain (CARDIA)    Difficulty of Paying Living Expenses: Not hard at all  Food Insecurity: No Food Insecurity (03/29/2023)   Hunger Vital Sign    Worried About Running Out of Food in the Last Year: Never true    Ran Out of Food in the Last Year: Never true  Transportation Needs: No Transportation Needs (03/29/2023)   PRAPARE - Administrator, Civil Service (Medical): No    Lack of Transportation (Non-Medical): No  Physical Activity: Sufficiently Active (03/29/2023)   Exercise Vital Sign    Days of Exercise per Week: 3 days    Minutes of Exercise per Session: 60 min  Stress: No Stress Concern Present (03/29/2023)    [employer] of Occupational Health - Occupational Stress Questionnaire    Feeling of Stress : Not at all  Social Connections: Moderately Isolated (03/29/2023)   Social Connection and Isolation Panel [NHANES]    Frequency of Communication with Friends and Family: Three times a week    Frequency of Social Gatherings with Friends and Family: Once a week    Attends Religious Services: Never    Database administrator or Organizations: No    Attends Banker Meetings: Never  Marital Status: Married    Activities of Daily Living    03/29/2023    9:41 AM  In your present state of health, do you have any difficulty performing the following activities:  Hearing? 1  Comment bilateral hearing aids  Vision? 0  Difficulty concentrating or making decisions? 1  Comment some memory loss  Walking or climbing stairs? 0  Dressing or bathing? 0  Doing errands, shopping? 0  Preparing Food and eating ? N  Using the Toilet? N  In the past six months, have you accidently leaked urine? Y  Do you have problems with loss of bowel control? N  Managing your Medications? N  Managing your Finances? N  Housekeeping or managing your Housekeeping? N    Patient Education/ Literacy How often do you need to have someone help you when you read instructions, pamphlets, or other written materials from your doctor or pharmacy?: 1 - Never What is the last grade level you completed in school?: 12th grade  Exercise Current Exercise Habits: Home exercise routine, Type of exercise: strength training/weights;calisthenics, Time (Minutes): 60, Frequency (Times/Week): 3, Weekly Exercise (Minutes/Week): 180, Intensity: Moderate, Exercise limited by: None identified  Diet Patient reports consuming  2-3  meals a day and 0-1 snack(s) a day Patient reports that his primary diet is: Regular Patient reports that she does have regular access to food.   Depression Screen    03/29/2023    9:38 AM 04/29/2022     8:28 AM 07/16/2021    8:19 AM 12/26/2019    3:12 PM 10/14/2018   10:44 AM 02/03/2018    2:28 PM 02/02/2017    9:45 AM  PHQ 2/9 Scores  PHQ - 2 Score 0 0 0 0 0 0 0  PHQ- 9 Score     1       Fall Risk    03/29/2023    9:37 AM 04/29/2022    8:27 AM 01/02/2021    3:13 PM 01/04/2020    3:35 PM 12/26/2019    3:12 PM  Fall Risk   Falls in the past year? 0 1 0 0 1  Number falls in past yr: 0 0 0 0 1  Injury with Fall? 0 0 0 0 0  Risk for fall due to : No Fall Risks No Fall Risks No Fall Risks No Fall Risks Impaired balance/gait  Follow up Falls evaluation completed Falls evaluation completed Falls evaluation completed  Falls prevention discussed     Objective:  Justin Mcgee seemed alert and oriented and he participated appropriately during our telephone visit.  Blood Pressure Weight BMI  BP Readings from Last 3 Encounters:  01/29/23 (!) 104/44  04/29/22 125/71  07/28/21 (!) 128/52   Wt Readings from Last 3 Encounters:  03/29/23 175 lb (79.4 kg)  01/29/23 171 lb 15.3 oz (78 kg)  04/29/22 182 lb (82.6 kg)   BMI Readings from Last 1 Encounters:  03/29/23 21.87 kg/m    *Unable to obtain current vital signs, weight, and BMI due to telephone visit type  Hearing/Vision  Justin Mcgee did not seem to have difficulty with hearing/understanding during the telephone conversation Reports that he has had a formal eye exam by an eye care professional within the past year Reports that he has had a formal hearing evaluation within the past year *Unable to fully assess hearing and vision during telephone visit type  Cognitive Function:    03/29/2023    9:43 AM 12/26/2019    3:15 PM 02/03/2018  2:17 PM  6CIT Screen  What Year? 0 points 0 points 0 points  What month? 0 points 0 points 0 points  What time? 0 points 0 points 0 points  Count back from 20 0 points 0 points 0 points  Months in reverse 0 points 0 points 0 points  Repeat phrase 0 points 4 points 0 points  Total Score 0 points 4  points 0 points   (Normal:0-7, Significant for Dysfunction: >8)  Normal Cognitive Function Screening: Yes   Immunization & Health Maintenance Record Immunization History  Administered Date(s) Administered   Fluad Quad(high Dose 65+) 06/26/2020, 07/16/2021   Influenza Split 09/18/2011   Influenza, High Dose Seasonal PF 08/04/2017, 08/09/2018   Influenza,inj,Quad PF,6+ Mos 07/17/2015, 07/01/2016   PFIZER(Purple Top)SARS-COV-2 Vaccination 01/08/2020, 01/30/2020   Pneumococcal Conjugate-13 07/17/2015   Pneumococcal-Unspecified 06/04/2009   Td 10/13/2007    Health Maintenance  Topic Date Due   DTaP/Tdap/Td (2 - Tdap) 10/12/2017   COVID-19 Vaccine (3 - Pfizer risk series) 04/14/2023 (Originally 02/27/2020)   Pneumonia Vaccine 73+ Years old (2 of 2 - PPSV23 or PCV20) 04/30/2023 (Originally 07/16/2016)   Zoster Vaccines- Shingrix (1 of 2) 06/29/2023 (Originally 12/28/1953)   INFLUENZA VACCINE  05/13/2023   Medicare Annual Wellness (AWV)  03/28/2024   HPV VACCINES  Aged Out       Assessment  This is a routine wellness examination for [employer].  Health Maintenance: Due or Overdue Health Maintenance Due  Topic Date Due   DTaP/Tdap/Td (2 - Tdap) 10/12/2017    Justin Mcgee does not need a referral for Community Assistance: Care Management:   no Social Work:    no Prescription Assistance:  no Nutrition/Diabetes Education:  no   Plan:  Personalized Goals  Goals Addressed               This Visit's Progress     Patient Stated (pt-stated)        Patient stated that he would like to maintain his current lifestyle.       Personalized Health Maintenance & Screening Recommendations  Td vaccine Shingles vaccine  Lung Cancer Screening Recommended: no (Low Dose CT Chest recommended if Age 71-80 years, 20 pack-year currently smoking OR have quit w/in past 15 years) Hepatitis C Screening recommended: no HIV Screening recommended: no  Advanced Directives: Written  information was not prepared per patient's request.  Referrals & Orders No orders of the defined types were placed in this encounter.   Follow-up Plan Follow-up with Everrett Coombe, DO as planned Schedule td vaccine and shingles vaccine at the pharmacy. Medicare wellness visit in one year.  Patient will access AVS on my chart.   I have personally reviewed and noted the following in the patient's chart:   Medical and social history Use of alcohol, tobacco or illicit drugs  Current medications and supplements Functional ability and status Nutritional status Physical activity Advanced directives List of other physicians Hospitalizations, surgeries, and ER visits in previous 12 months Vitals Screenings to include cognitive, depression, and falls Referrals and appointments  In addition, I have reviewed and discussed with Justin Mcgee certain preventive protocols, quality metrics, and best practice recommendations. A written personalized care plan for preventive services as well as general preventive health recommendations is available and can be mailed to the patient at his request.      Modesto Charon, RN BSN  03/29/2023

## 2023-03-29 NOTE — Patient Instructions (Signed)
MEDICARE ANNUAL WELLNESS VISIT Health Maintenance Summary and Written Plan of Care  Mr. Justin Mcgee ,  Thank you for allowing me to perform your Medicare Annual Wellness Visit and for your ongoing commitment to your health.   Health Maintenance & Immunization History Health Maintenance  Topic Date Due   DTaP/Tdap/Td (2 - Tdap) 10/12/2017   COVID-19 Vaccine (3 - Pfizer risk series) 04/14/2023 (Originally 02/27/2020)   Pneumonia Vaccine 31+ Years old (2 of 2 - PPSV23 or PCV20) 04/30/2023 (Originally 07/16/2016)   Zoster Vaccines- Shingrix (1 of 2) 06/29/2023 (Originally 12/28/1953)   INFLUENZA VACCINE  05/13/2023   Medicare Annual Wellness (AWV)  03/28/2024   HPV VACCINES  Aged Out   Immunization History  Administered Date(s) Administered   Fluad Quad(high Dose 65+) 06/26/2020, 07/16/2021   Influenza Split 09/18/2011   Influenza, High Dose Seasonal PF 08/04/2017, 08/09/2018   Influenza,inj,Quad PF,6+ Mos 07/17/2015, 07/01/2016   PFIZER(Purple Top)SARS-COV-2 Vaccination 01/08/2020, 01/30/2020   Pneumococcal Conjugate-13 07/17/2015   Pneumococcal-Unspecified 06/04/2009   Td 10/13/2007    These are the patient goals that we discussed:  Goals Addressed               This Visit's Progress     Patient Stated (pt-stated)        Patient stated that he would like to maintain his current lifestyle.         This is a list of Health Maintenance Items that are overdue or due now: Health Maintenance Due  Topic Date Due   DTaP/Tdap/Td (2 - Tdap) 10/12/2017  Shingles vaccine  Orders/Referrals Placed Today: No orders of the defined types were placed in this encounter.  (Contact our referral department at [phone] if you have not spoken with someone about your referral appointment within the next 5 days)    Follow-up Plan Follow-up with Everrett Coombe, DO as planned Schedule td vaccine and shingles vaccine at the pharmacy. Medicare wellness visit in one year.  Patient will  access AVS on my chart.      Health Maintenance, Male Adopting a healthy lifestyle and getting preventive care are important in promoting health and wellness. Ask your health care provider about: The right schedule for you to have regular tests and exams. Things you can do on your own to prevent diseases and keep yourself healthy. What should I know about diet, weight, and exercise? Eat a healthy diet  Eat a diet that includes plenty of vegetables, fruits, low-fat dairy products, and lean protein. Do not eat a lot of foods that are high in solid fats, added sugars, or sodium. Maintain a healthy weight Body mass index (BMI) is a measurement that can be used to identify possible weight problems. It estimates body fat based on height and weight. Your health care provider can help determine your BMI and help you achieve or maintain a healthy weight. Get regular exercise Get regular exercise. This is one of the most important things you can do for your health. Most adults should: Exercise for at least 150 minutes each week. The exercise should increase your heart rate and make you sweat (moderate-intensity exercise). Do strengthening exercises at least twice a week. This is in addition to the moderate-intensity exercise. Spend less time sitting. Even light physical activity can be beneficial. Watch cholesterol and blood lipids Have your blood tested for lipids and cholesterol at 86 years of age, then have this test every 5 years. You may need to have your cholesterol levels checked more often if: Your  lipid or cholesterol levels are high. You are older than 86 years of age. You are at high risk for heart disease. What should I know about cancer screening? Many types of cancers can be detected early and may often be prevented. Depending on your health history and family history, you may need to have cancer screening at various ages. This may include screening for: Colorectal cancer. Prostate  cancer. Skin cancer. Lung cancer. What should I know about heart disease, diabetes, and high blood pressure? Blood pressure and heart disease High blood pressure causes heart disease and increases the risk of stroke. This is more likely to develop in people who have high blood pressure readings or are overweight. Talk with your health care provider about your target blood pressure readings. Have your blood pressure checked: Every 3-5 years if you are 64-44 years of age. Every year if you are 49 years old or older. If you are between the ages of 76 and 38 and are a current or former smoker, ask your health care provider if you should have a one-time screening for abdominal aortic aneurysm (AAA). Diabetes Have regular diabetes screenings. This checks your fasting blood sugar level. Have the screening done: Once every three years after age 3 if you are at a normal weight and have a low risk for diabetes. More often and at a younger age if you are overweight or have a high risk for diabetes. What should I know about preventing infection? Hepatitis B If you have a higher risk for hepatitis B, you should be screened for this virus. Talk with your health care provider to find out if you are at risk for hepatitis B infection. Hepatitis C Blood testing is recommended for: Everyone born from [birth year] through 1963. Anyone with known risk factors for hepatitis C. Sexually transmitted infections (STIs) You should be screened each year for STIs, including gonorrhea and chlamydia, if: You are sexually active and are younger than 86 years of age. You are older than 86 years of age and your health care provider tells you that you are at risk for this type of infection. Your sexual activity has changed since you were last screened, and you are at increased risk for chlamydia or gonorrhea. Ask your health care provider if you are at risk. Ask your health care provider about whether you are at high risk for HIV.  Your health care provider may recommend a prescription medicine to help prevent HIV infection. If you choose to take medicine to prevent HIV, you should first get tested for HIV. You should then be tested every 3 months for as long as you are taking the medicine. Follow these instructions at home: Alcohol use Do not drink alcohol if your health care provider tells you not to drink. If you drink alcohol: Limit how much you have to 0-2 drinks a day. Know how much alcohol is in your drink. In the [place] one drink equals one 12 oz bottle of beer (355 mL), one 5 oz glass of wine (148 mL), or one 1 oz glass of hard liquor (44 mL). Lifestyle Do not use any products that contain nicotine or tobacco. These products include cigarettes, chewing tobacco, and vaping devices, such as e-cigarettes. If you need help quitting, ask your health care provider. Do not use street drugs. Do not share needles. Ask your health care provider for help if you need support or information about quitting drugs. General instructions Schedule regular health, dental, and eye exams. Stay current  with your vaccines. Tell your health care provider if: You often feel depressed. You have ever been abused or do not feel safe at home. Summary Adopting a healthy lifestyle and getting preventive care are important in promoting health and wellness. Follow your health care provider's instructions about healthy diet, exercising, and getting tested or screened for diseases. Follow your health care provider's instructions on monitoring your cholesterol and blood pressure. This information is not intended to replace advice given to you by your health care provider. Make sure you discuss any questions you have with your health care provider. Document Revised: 02/17/2021 Document Reviewed: 02/17/2021 Elsevier Patient Education  2024 [employer].

## 2023-03-31 ENCOUNTER — Ambulatory Visit (INDEPENDENT_AMBULATORY_CARE_PROVIDER_SITE_OTHER): Payer: Medicare Other | Admitting: Family Medicine

## 2023-03-31 ENCOUNTER — Encounter: Payer: Self-pay | Admitting: Family Medicine

## 2023-03-31 VITALS — BP 149/50 | HR 72 | Ht 75.0 in | Wt 172.0 lb

## 2023-03-31 DIAGNOSIS — M5417 Radiculopathy, lumbosacral region: Secondary | ICD-10-CM | POA: Diagnosis not present

## 2023-03-31 DIAGNOSIS — K922 Gastrointestinal hemorrhage, unspecified: Secondary | ICD-10-CM | POA: Diagnosis not present

## 2023-03-31 DIAGNOSIS — M21379 Foot drop, unspecified foot: Secondary | ICD-10-CM | POA: Insufficient documentation

## 2023-03-31 DIAGNOSIS — K219 Gastro-esophageal reflux disease without esophagitis: Secondary | ICD-10-CM

## 2023-03-31 DIAGNOSIS — M2170 Unequal limb length (acquired), unspecified site: Secondary | ICD-10-CM | POA: Diagnosis not present

## 2023-03-31 DIAGNOSIS — M21372 Foot drop, left foot: Secondary | ICD-10-CM | POA: Diagnosis not present

## 2023-03-31 MED ORDER — PANTOPRAZOLE SODIUM 40 MG PO TBEC: 40.0000 mg | DELAYED_RELEASE_TABLET | Freq: Every day | ORAL | 1 refills | Status: DC

## 2023-03-31 MED ORDER — PREGABALIN 150 MG PO CAPS
150.0000 mg | ORAL_CAPSULE | Freq: Every day | ORAL | 1 refills | Status: DC
Start: 2023-03-31 — End: 2023-05-10

## 2023-03-31 NOTE — Patient Instructions (Signed)
I have sent in the increased strength of lyrica 150mg  to your pharmacy.  Follow up with me in 6 months.

## 2023-03-31 NOTE — Assessment & Plan Note (Signed)
Increased strength of Lyrica sent in 150 mg nightly.  He will let me know if his increase strength is not effective for him and we will back back down to his previous strength.

## 2023-03-31 NOTE — Assessment & Plan Note (Signed)
Has not had any further symptoms.  He was avoiding NSAIDs.  Continue PPI.

## 2023-03-31 NOTE — Progress Notes (Signed)
Justin Mcgee - 88 y.o. male MRN [id number]  Date of birth: 04-19-1935  Subjective Chief Complaint  Patient presents with   Follow-up    HPI Justin Mcgee is an 88 year old male here today for follow-up visit.  He continues on Lyrica for management of neuropathy.  He has been out of this for a couple days and reports that he really has not noticed a big difference about this.  He would be interested in trying a slightly increased dose to see if this is more effective.  He is tolerating this well without side effects.  He denies oversedation or dizziness.  He did have GI bleed in April resulting in hospital admission.  Underwent endoscopy showing some small erosions in the stomach.  Continues on pantoprazole.  Instructed to discontinue home ibuprofen as well as Celebrex.  He has not had any further GI symptoms.  ROS:  A comprehensive ROS was completed and negative except as noted per HPI  Allergies  Allergen Reactions   Flomax [Tamsulosin Hcl] Other (See Comments)    Dizziness   Gabapentin Other (See Comments)    Unsteadiness and Balance problems.     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 Derryl Harbor, no radiation or chemo, clear LN.     Past Surgical History:  Procedure Laterality Date   APPENDECTOMY  1997   CATARACT EXTRACTION     CHOLECYSTECTOMY  05/16/10   in [place] while on vacation   ESOPHAGOGASTRODUODENOSCOPY (EGD) WITH PROPOFOL N/A 01/29/2023   Procedure: ESOPHAGOGASTRODUODENOSCOPY (EGD) WITH PROPOFOL;  Surgeon: Hilarie Fredrickson, MD;  Location: [facility];  Service: Gastroenterology;  Laterality: N/A;   INGUINAL HERNIA REPAIR Right 1965, 1968    INGUINAL HERNIA REPAIR Left 1994   PARTIAL GLOSSECTOMY     with unilat neck dissection   TOTAL KNEE ARTHROPLASTY Left 03/07/2021   Procedure: TOTAL KNEE ARTHROPLASTY;  Surgeon: Jodi Geralds, MD;  Location: [facility];  Service: Orthopedics;  Laterality: Left;    Social History   Socioeconomic History   Marital status: Married    Spouse name: Haze Ferrufino    Number of children: 2   Years of education: 14   Highest education level: Some college, no degree  Occupational History   Occupation: Research officer, political party: [employer]   Occupation: Retired  Tobacco Use   Smoking status: Former    Types: Cigarettes    Quit date: 02/19/1978    Years since quitting: 45.1   Smokeless tobacco: Never  Vaping Use   Vaping Use: Never used  Substance and Sexual Activity   Alcohol use: No    Alcohol/week: 0.0 standard drinks of alcohol   Drug use: No   Sexual activity: Yes    Partners: Female  Other Topics Concern   Not on file  Social History Narrative   Lives with spouse. He has two children. He enjoys fishing.   Social Determinants of Health   Financial Resource Strain: Low Risk  (03/29/2023)   Overall Financial Resource Strain (CARDIA)    Difficulty of Paying Living  Expenses: Not hard at all  Food Insecurity: No Food Insecurity (03/29/2023)   Hunger Vital Sign    Worried About Running Out of Food in the Last Year: Never true    Ran Out of Food in the Last Year: Never true  Transportation Needs: No Transportation Needs (03/29/2023)   PRAPARE - Administrator, Civil Service (Medical): No    Lack of Transportation (Non-Medical): No  Physical Activity: Sufficiently Active (03/29/2023)   Exercise Vital Sign    Days of Exercise per Week: 3 days    Minutes of Exercise per Session: 60 min  Stress: No Stress Concern Present (03/29/2023)   [employer] of Occupational Health - Occupational Stress Questionnaire    Feeling of Stress : Not at all  Social Connections:  Moderately Isolated (03/29/2023)   Social Connection and Isolation Panel [NHANES]    Frequency of Communication with Friends and Family: Three times a week    Frequency of Social Gatherings with Friends and Family: Once a week    Attends Religious Services: Never    Database administrator or Organizations: No    Attends Engineer, structural: Never    Marital Status: Married    Family History  Problem Relation Age of Onset   GER disease Father    Other Father        Gilliam-Barre   Diabetes Father    Colon cancer Mother    Diabetes Other     Health Maintenance  Topic Date Due   DTaP/Tdap/Td (2 - Tdap) 10/12/2017   COVID-19 Vaccine (3 - Pfizer risk series) 04/14/2023 (Originally 02/27/2020)   Pneumonia Vaccine 65+ Years old (2 of 2 - PPSV23 or PCV20) 04/30/2023 (Originally 07/16/2016)   Zoster Vaccines- Shingrix (1 of 2) 06/29/2023 (Originally 12/28/1955)   INFLUENZA VACCINE  05/13/2023   Medicare Annual Wellness (AWV)  03/28/2024   HPV VACCINES  Aged Out     ----------------------------------------------------------------------------------------------------------------------------------------------------------------------------------------------------------------- Physical Exam BP (!) 149/50 (BP Location: Left Arm, Patient Position: Sitting, Cuff Size: Normal)   Pulse 72   Ht 6\' 3"  (1.905 m)   Wt 172 lb (78 kg)   SpO2 95%   BMI 21.50 kg/m   Physical Exam Constitutional:      Appearance: Normal appearance.  HENT:     Head: Normocephalic and atraumatic.  Cardiovascular:     Rate and Rhythm: Normal rate and regular rhythm.  Pulmonary:     Effort: Pulmonary effort is normal.     Breath sounds: Normal breath sounds.  Neurological:     Mental Status: He is alert.  Psychiatric:        Mood and Affect: Mood normal.        Behavior: Behavior normal.      ------------------------------------------------------------------------------------------------------------------------------------------------------------------------------------------------------------------- Assessment and Plan  Neuropathy, lumbosacral (radicular) Increased strength of Lyrica sent in 150 mg nightly.  He will let me know if his increase strength is not effective for him and we will back back down to his previous strength.  Upper GI bleed Has not had any further symptoms.  He was avoiding NSAIDs.  Continue PPI.   Meds ordered this encounter  Medications   pregabalin (LYRICA) 150 MG capsule    Sig: Take 1 capsule (150 mg total) by mouth daily.    Dispense:  90 capsule    Refill:  1    Not to exceed 5 additional fills before 05/18/2022   pantoprazole (PROTONIX) 40 MG tablet    Sig: Take 1 tablet (40  mg total) by mouth daily before breakfast.    Dispense:  90 tablet    Refill:  1    Return in about 6 months (around 09/30/2023) for f/u neuropathy.    This visit occurred during the SARS-CoV-2 public health emergency.  Safety protocols were in place, including screening questions prior to the visit, additional usage of staff PPE, and extensive cleaning of exam room while observing appropriate contact time as indicated for disinfecting solutions.

## 2023-05-10 ENCOUNTER — Other Ambulatory Visit: Payer: Self-pay

## 2023-05-10 DIAGNOSIS — M5417 Radiculopathy, lumbosacral region: Secondary | ICD-10-CM

## 2023-05-10 DIAGNOSIS — K219 Gastro-esophageal reflux disease without esophagitis: Secondary | ICD-10-CM

## 2023-05-10 MED ORDER — PANTOPRAZOLE SODIUM 40 MG PO TBEC: 40.0000 mg | DELAYED_RELEASE_TABLET | Freq: Every day | ORAL | 1 refills | Status: DC

## 2023-05-10 MED ORDER — PREGABALIN 150 MG PO CAPS: 150.0000 mg | ORAL_CAPSULE | Freq: Every day | ORAL | 1 refills | Status: DC

## 2023-09-30 ENCOUNTER — Ambulatory Visit (INDEPENDENT_AMBULATORY_CARE_PROVIDER_SITE_OTHER): Payer: Medicare Other | Admitting: Family Medicine

## 2023-09-30 ENCOUNTER — Encounter: Payer: Self-pay | Admitting: Family Medicine

## 2023-09-30 VITALS — BP 131/60 | HR 59 | Ht 75.0 in | Wt 169.0 lb

## 2023-09-30 DIAGNOSIS — Z23 Encounter for immunization: Secondary | ICD-10-CM | POA: Diagnosis not present

## 2023-09-30 DIAGNOSIS — D485 Neoplasm of uncertain behavior of skin: Secondary | ICD-10-CM | POA: Insufficient documentation

## 2023-09-30 DIAGNOSIS — M5417 Radiculopathy, lumbosacral region: Secondary | ICD-10-CM | POA: Diagnosis not present

## 2023-09-30 DIAGNOSIS — K219 Gastro-esophageal reflux disease without esophagitis: Secondary | ICD-10-CM

## 2023-09-30 MED ORDER — PANTOPRAZOLE SODIUM 40 MG PO TBEC: 40.0000 mg | DELAYED_RELEASE_TABLET | Freq: Every day | ORAL | 3 refills | Status: DC

## 2023-09-30 NOTE — Progress Notes (Signed)
Justin Mcgee - 88 y.o. male MRN [id number]  Date of birth: July 11, 1935  Subjective Chief Complaint  Patient presents with   Peripheral Neuropathy   Nevus    HPI Justin Mcgee is an 88 year old male here today for follow-up.  Reports he is doing well at this time.  He does have a nonhealing lesion on his chest that has been present for about 6 months.  He would like to see dermatology for this.  Neuropathy has improved.  He has no longer using Lyrica for burning sensation in his feet.  ROS:  A comprehensive ROS was completed and negative except as noted per HPI  Allergies  Allergen Reactions   Flomax [Tamsulosin Hcl] Other (See Comments)    Dizziness   Gabapentin Other (See Comments)    Unsteadiness and Balance problems.     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 Derryl Harbor, no radiation or chemo, clear LN.     Past Surgical History:  Procedure Laterality Date   APPENDECTOMY  1997   CATARACT EXTRACTION     CHOLECYSTECTOMY  05/16/10   in [place] while on vacation   ESOPHAGOGASTRODUODENOSCOPY (EGD) WITH PROPOFOL N/A 01/29/2023   Procedure: ESOPHAGOGASTRODUODENOSCOPY (EGD) WITH PROPOFOL;  Surgeon: Hilarie Fredrickson, MD;  Location: [facility];  Service: Gastroenterology;  Laterality: N/A;   INGUINAL HERNIA REPAIR Right 1965, 1968   INGUINAL HERNIA REPAIR Left 1994   PARTIAL GLOSSECTOMY     with unilat neck dissection   TOTAL KNEE ARTHROPLASTY Left 03/07/2021   Procedure: TOTAL KNEE ARTHROPLASTY;  Surgeon: Jodi Geralds, MD;  Location: [facility];  Service: Orthopedics;  Laterality: Left;    Social History   Socioeconomic History   Marital status: Married     Spouse name: Mccain Buchberger    Number of children: 2   Years of education: 14   Highest education level: Some college, no degree  Occupational History   Occupation: Research officer, political party: [employer]   Occupation: Retired  Tobacco Use   Smoking status: Former    Current packs/day: 0.00    Types: Cigarettes    Quit date: 02/19/1978    Years since quitting: 45.6   Smokeless tobacco: Never  Vaping Use   Vaping status: Never Used  Substance and Sexual Activity   Alcohol use: No    Alcohol/week: 0.0 standard drinks of alcohol   Drug use: No   Sexual activity: Yes    Partners: Female  Other Topics Concern   Not on file  Social History Narrative   Lives with spouse. He has two children. He enjoys fishing.   Social Drivers of Corporate investment banker Strain: Low Risk  (03/29/2023)   Overall Financial Resource Strain (CARDIA)    Difficulty of Paying Living Expenses: Not hard at all  Food Insecurity: No Food Insecurity (03/29/2023)   Hunger Vital Sign    Worried About Running Out of Food in the Last Year: Never true    Ran Out of Food in the Last Year: Never true  Transportation Needs: No  Transportation Needs (03/29/2023)   PRAPARE - Administrator, Civil Service (Medical): No    Lack of Transportation (Non-Medical): No  Physical Activity: Sufficiently Active (03/29/2023)   Exercise Vital Sign    Days of Exercise per Week: 3 days    Minutes of Exercise per Session: 60 min  Stress: No Stress Concern Present (03/29/2023)   [employer] of Occupational Health - Occupational Stress Questionnaire    Feeling of Stress : Not at all  Social Connections: Moderately Isolated (03/29/2023)   Social Connection and Isolation Panel [NHANES]    Frequency of Communication with Friends and Family: Three times a week    Frequency of Social Gatherings with Friends and Family: Once a week    Attends Religious Services: Never    Database administrator or Organizations: No     Attends Engineer, structural: Never    Marital Status: Married    Family History  Problem Relation Age of Onset   GER disease Father    Other Father        Gilliam-Barre   Diabetes Father    Colon cancer Mother    Diabetes Other     Health Maintenance  Topic Date Due   DTaP/Tdap/Td (2 - Tdap) 10/12/2017   COVID-19 Vaccine (3 - Pfizer risk series) 10/16/2023 (Originally 02/27/2020)   Zoster Vaccines- Shingrix (1 of 2) 12/29/2023 (Originally 12/28/1955)   Pneumonia Vaccine 64+ Years old (2 of 2 - PPSV23 or PCV20) 09/29/2024 (Originally 09/11/2015)   Medicare Annual Wellness (AWV)  03/28/2024   INFLUENZA VACCINE  Completed   HPV VACCINES  Aged Out     ----------------------------------------------------------------------------------------------------------------------------------------------------------------------------------------------------------------- Physical Exam BP 131/60 (BP Location: Left Arm, Patient Position: Sitting, Cuff Size: Normal)   Pulse (!) 59   Ht 6\' 3"  (1.905 m)   Wt 169 lb (76.7 kg)   SpO2 96%   BMI 21.12 kg/m   Physical Exam Constitutional:      Appearance: Normal appearance.  Skin:    Comments: Slightly raised, ulcerated lesion in the upper central portion of the chest.  Approximately approximately 1 cm in length.  Neurological:     Mental Status: He is alert.     ------------------------------------------------------------------------------------------------------------------------------------------------------------------------------------------------------------------- Assessment and Plan  Neoplasm of uncertain behavior of skin of chest He would like to see dermatology.  Referral placed.  Neuropathy, lumbosacral (radicular) This is improved quite a bit.  He has no longer using Lyrica.  He will let me know if worsening.   Meds ordered this encounter  Medications   pantoprazole (PROTONIX) 40 MG tablet    Sig: Take 1 tablet (40  mg total) by mouth daily before breakfast.    Dispense:  90 tablet    Refill:  3    No follow-ups on file.    This visit occurred during the SARS-CoV-2 public health emergency.  Safety protocols were in place, including screening questions prior to the visit, additional usage of staff PPE, and extensive cleaning of exam room while observing appropriate contact time as indicated for disinfecting solutions.

## 2023-09-30 NOTE — Assessment & Plan Note (Signed)
This is improved quite a bit.  He has no longer using Lyrica.  He will let me know if worsening.

## 2023-09-30 NOTE — Assessment & Plan Note (Signed)
He would like to see dermatology.  Referral placed.

## 2023-10-25 DIAGNOSIS — H52203 Unspecified astigmatism, bilateral: Secondary | ICD-10-CM | POA: Diagnosis not present

## 2023-10-25 DIAGNOSIS — Z961 Presence of intraocular lens: Secondary | ICD-10-CM | POA: Diagnosis not present

## 2023-10-27 ENCOUNTER — Telehealth: Payer: Self-pay | Admitting: Family Medicine

## 2023-10-27 NOTE — Telephone Encounter (Signed)
 Copied from CRM [id number]. Topic: Referral - Status >> Oct 27, 2023  9:29 AM Hilton Lucky wrote: Reason for CRM: Patient is calling to state that as Dermatology has not gotten back to him or provider about making an appointment, he will be making an appointment on his own with a provider he used to see.

## 2023-10-27 NOTE — Telephone Encounter (Signed)
 Please contact patient regarding dermatology referral.

## 2023-10-29 ENCOUNTER — Telehealth: Payer: Self-pay | Admitting: Family Medicine

## 2023-10-29 NOTE — Telephone Encounter (Signed)
Copied from CRM [id number]. Topic: Referral - Status >> Oct 29, 2023  9:09 AM Nila Nephew wrote: Reason for CRM: Patient calling in to request to speak to provider or nurse concerning referral. Patient appears to be under misconception that provider is scheduling the dermatology appointment. Tried to advise patient about his options for referral, as he has not heard anything yet. Patient will cancel referral with provider's permission if he has not heard in the next week.

## 2023-11-09 ENCOUNTER — Telehealth: Payer: Self-pay | Admitting: Family Medicine

## 2023-11-09 ENCOUNTER — Telehealth: Payer: Self-pay

## 2023-11-09 NOTE — Telephone Encounter (Signed)
Copied from CRM [id number]. Topic: General - Other >> Nov 09, 2023  1:21 PM Nila Nephew wrote: Reason for CRM: Patient calling to request that we call ONLY his house phone temporarily, as his cell phone is not working properly.

## 2023-11-09 NOTE — Telephone Encounter (Signed)
Copied from CRM [id number]. Topic: Referral - Status >> Nov 09, 2023  1:12 PM Elle L wrote: Reason for CRM: The patient has established care with a Dermatologist and is requesting his referral from Dr. Ashley Royalty to be cancelled.

## 2023-11-09 NOTE — Telephone Encounter (Signed)
Added sticky note to patient chart

## 2023-11-11 NOTE — Telephone Encounter (Signed)
11/11/23-Patient has requested that we cancel dermatology referral. Patient is already established with dermatologist. Referral has been closed per patient request.

## 2023-11-11 NOTE — Telephone Encounter (Signed)
Copied from CRM [id number]. Topic: General - Other >> Nov 09, 2023  1:21 PM Nila Nephew wrote: Reason for CRM: Patient calling to request that we call ONLY his house phone temporarily, as his cell phone is not working properly.

## 2023-11-11 NOTE — Telephone Encounter (Addendum)
I took cell number out of his chart. Patient is aware.

## 2023-12-16 DIAGNOSIS — D485 Neoplasm of uncertain behavior of skin: Secondary | ICD-10-CM | POA: Diagnosis not present

## 2023-12-16 DIAGNOSIS — C44519 Basal cell carcinoma of skin of other part of trunk: Secondary | ICD-10-CM | POA: Diagnosis not present

## 2023-12-16 DIAGNOSIS — L821 Other seborrheic keratosis: Secondary | ICD-10-CM | POA: Diagnosis not present

## 2023-12-16 DIAGNOSIS — L720 Epidermal cyst: Secondary | ICD-10-CM | POA: Diagnosis not present

## 2023-12-16 DIAGNOSIS — D2261 Melanocytic nevi of right upper limb, including shoulder: Secondary | ICD-10-CM | POA: Diagnosis not present

## 2024-02-24 DIAGNOSIS — C44519 Basal cell carcinoma of skin of other part of trunk: Secondary | ICD-10-CM | POA: Diagnosis not present

## 2024-06-13 ENCOUNTER — Encounter: Payer: Self-pay | Admitting: Sports Medicine

## 2024-10-11 ENCOUNTER — Ambulatory Visit
Admission: EM | Admit: 2024-10-11 | Discharge: 2024-10-11 | Disposition: A | Discharge status: Home / Self Care | Source: Home / Self Care | Attending: Family Medicine | Admitting: Family Medicine

## 2024-10-11 DIAGNOSIS — S0990XA Unspecified injury of head, initial encounter: Secondary | ICD-10-CM

## 2024-10-11 DIAGNOSIS — S0101XA Laceration without foreign body of scalp, initial encounter: Secondary | ICD-10-CM | POA: Diagnosis not present

## 2024-10-11 DIAGNOSIS — W19XXXA Unspecified fall, initial encounter: Secondary | ICD-10-CM | POA: Diagnosis not present

## 2024-10-11 DIAGNOSIS — Z23 Encounter for immunization: Secondary | ICD-10-CM

## 2024-10-11 DIAGNOSIS — Y92009 Unspecified place in unspecified non-institutional (private) residence as the place of occurrence of the external cause: Secondary | ICD-10-CM | POA: Diagnosis not present

## 2024-10-11 MED ORDER — LIDOCAINE-EPINEPHRINE-TETRACAINE (LET) TOPICAL GEL
3.0000 mL | Freq: Once | TOPICAL | Status: AC
  Administered 2024-10-11: 3 mL via TOPICAL

## 2024-10-11 MED ORDER — TETANUS-DIPHTH-ACELL PERTUSSIS 5-2-15.5 LF-MCG/0.5 IM SUSP
0.5000 mL | Freq: Once | INTRAMUSCULAR | Status: AC
  Administered 2024-10-11: 0.5 mL via INTRAMUSCULAR

## 2024-10-11 NOTE — Discharge Instructions (Signed)
 Wash daily and apply antibiotic ointment Try to keep area moist  If you develop any signs of head injury such as increasing pain, confusion, nausea or vomiting, problems with balance or weakness you must go to the emergency room.  Call 911  Follow-up with Dr. Alvia next week

## 2024-10-11 NOTE — ED Triage Notes (Signed)
 Pt states that he fell and hit the cement. Pt states that he has some bruising and swelling of his forehead. Pt states that he also injured his left middle finger. Pt states that he also injured his right middle finger. 1 day

## 2024-10-11 NOTE — ED Provider Notes (Signed)
 " [facility]    CSN: [id number] Arrival date & time: 10/11/24  1320      History   Chief Complaint Chief Complaint  Patient presents with   Fall    HPI MATVEY LLANAS is a 89 y.o. male.   Patient states that he has an unsteady gait.  He states he has peripheral neuropathy.  He has frequent falls.  He fell today in his driveway.  States that he tripped over a crack in the cement.  He fell forward and scraped both hands.  He has small wounds on the fingertips of both hands.  He also has a large abrasion on his face.  He applied ice.  He is here for evaluation. I discussed with the patient that with a significant head injury at the age of 47 it would be reasonable to go the emergency room for a CAT scan.  Patient states I do not want a CAT scan.  He understands that he is at increased risk of brain bleed and serious complication.  Denies loss of consciousness, headache, nausea, blurry vision, difficulty thinking.  Previously worked in social worker.  Understands signs of head injury.    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 Seward, no radiation or chemo, clear LN.     Patient Active Problem List   Diagnosis Date Noted   Neoplasm of uncertain behavior of skin of chest 09/30/2023   Dropfoot 03/31/2023   Acquired leg length discrepancy 03/31/2023   Gastric erosion 01/29/2023   Coffee ground emesis 01/29/2023   Upper GI bleed 01/28/2023   Sensorineural hearing loss (SNHL) of both ears 08/07/2022   Hearing loss 04/29/2022   Actinic keratosis 04/29/2022   Rash 07/28/2021   Primary osteoarthritis of left knee 03/07/2021   Digital mucinous cyst of right thumb 12/06/2020   Primary osteoarthritis of left shoulder  01/11/2020   CKD (chronic kidney disease) stage 3, GFR 30-59 ml/min (HCC) 01/04/2020   Anemia 08/28/2019   Mass of subcutaneous tissue of back 07/11/2019   Cervical lymphadenopathy 07/11/2019   Nummular eczematous dermatitis 05/16/2018   Other viral warts 05/02/2018   Paroxysmal atrial tachycardia by electrocardiography 12/23/2016   Unsteady gait 12/08/2016   Uses hearing aid 08/06/2016   Neuropathy, lumbosacral (radicular) 08/08/2015   Spongiotic dermatitis 11/16/2014   Primary osteoarthritis of both knees 02/02/2014   IFG (impaired fasting glucose) 04/19/2013   GERD (gastroesophageal reflux disease) 03/22/2013   History of squamous cell carcinoma of tongue 03/22/2013   History of basal cell carcinoma of skin 03/22/2013   Former smoker    Tinea corporis 05/24/2012    Past Surgical History:  Procedure Laterality Date   APPENDECTOMY  1997   CATARACT EXTRACTION     CHOLECYSTECTOMY  05/16/10   in [place]  while on vacation   ESOPHAGOGASTRODUODENOSCOPY (EGD) WITH PROPOFOL  N/A 01/29/2023   Procedure: ESOPHAGOGASTRODUODENOSCOPY (EGD) WITH PROPOFOL ;  Surgeon: Abran Norleen SAILOR, MD;  Location: [facility];  Service: Gastroenterology;  Laterality: N/A;   INGUINAL HERNIA REPAIR Right 1965, 1968   INGUINAL HERNIA REPAIR Left 1994   PARTIAL GLOSSECTOMY     with unilat neck dissection   TOTAL KNEE  ARTHROPLASTY Left 03/07/2021   Procedure: TOTAL KNEE ARTHROPLASTY;  Surgeon: Yvone Rush, MD;  Location: [facility];  Service: Orthopedics;  Laterality: Left;       Home Medications    Prior to Admission medications  Medication Sig Start Date End Date Taking? Authorizing Provider  Multiple Vitamin (MULTIVITAMIN WITH MINERALS) TABS tablet Take 1 tablet by mouth daily with breakfast.   Yes [provider]  pantoprazole  (PROTONIX ) 40 MG tablet Take 1 tablet (40 mg total) by mouth daily before breakfast. 09/30/23  Yes Alvia Bring, DO    Family History Family History  Problem Relation  Age of Onset   GER disease Father    Other Father        Gilliam-Barre   Diabetes Father    Colon cancer Mother    Diabetes Other     Social History Social History[1]   Allergies   Flomax  [tamsulosin  hcl], Gabapentin , and Sulfa antibiotics   Review of Systems Review of Systems See HPI  Physical Exam Triage Vital Signs ED Triage Vitals  Encounter Vitals Group     BP 10/11/24 1430 135/72     Girls Systolic BP Percentile --      Girls Diastolic BP Percentile --      Boys Systolic BP Percentile --      Boys Diastolic BP Percentile --      Pulse Rate 10/11/24 1430 (!) 51     Resp 10/11/24 1430 16     Temp 10/11/24 1430 98.7 F (37.1 C)     Temp Source 10/11/24 1430 Oral     SpO2 10/11/24 1430 97 %     Weight 10/11/24 1428 165 lb (74.8 kg)     Height 10/11/24 1428 6' 3 (1.905 m)     Head Circumference --      Peak Flow --      Pain Score 10/11/24 1428 4     Pain Loc --      Pain Education --      Exclude from Growth Chart --    No data found.  Updated Vital Signs BP 135/72 (BP Location: Right Arm)   Pulse (!) 51   Temp 98.7 F (37.1 C) (Oral)   Resp 16   Ht 6' 3 (1.905 m)   Wt 74.8 kg   SpO2 97%   BMI 20.62 kg/m       Physical Exam Constitutional:      General: He is not in acute distress.    Appearance: He is well-developed and normal weight.  HENT:     Head: Normocephalic.  Eyes:     Conjunctiva/sclera: Conjunctivae normal.     Pupils: Pupils are equal, round, and reactive to light.  Cardiovascular:     Rate and Rhythm: Normal rate.  Pulmonary:     Effort: Pulmonary effort is normal. No respiratory distress.  Abdominal:     General: There is no distension.     Palpations: Abdomen is soft.  Musculoskeletal:        General: Normal range of motion.     Cervical back: Normal range of motion.  Skin:    General: Skin is warm and dry.     Comments: Large abrasion above left eye.  Tenderness with palpation of brow.  Eyes have normal extraocular  motion.  Vision and gaze appears normal.  Area was coated in L ET and then cleaned.  There is no deep area to suture.  Neurological:     General: No  focal deficit present.     Mental Status: He is alert.    Small abrasions to fingertips long fingers both hands  UC Treatments / Results  Labs (all labs ordered are listed, but only abnormal results are displayed) Labs Reviewed - No data to display  EKG   Radiology No results found.  Procedures Procedures (including critical care time)  Medications Ordered in UC Medications  lidocaine -EPINEPHrine -tetracaine (LET) topical gel (3 mLs Topical Given 10/11/24 1456)  Tdap (ADACEL) injection 0.5 mL (0.5 mLs Intramuscular Given 10/11/24 1523)    Initial Impression / Assessment and Plan / UC Course  I have reviewed the triage vital signs and the nursing notes.  Pertinent labs & imaging results that were available during my care of the patient were reviewed by me and considered in my medical decision making (see chart for details).     Discussed wound care at home Discussed reasons for ER visit Discussed my preference that he go to an ER for a CAT scan when she again declines Final Clinical Impressions(s) / UC Diagnoses   Final diagnoses:  Fall in home, initial encounter  Minor head injury, initial encounter  Laceration of scalp, initial encounter     Discharge Instructions      Wash daily and apply antibiotic ointment Try to keep area moist  If you develop any signs of head injury such as increasing pain, confusion, nausea or vomiting, problems with balance or weakness you must go to the emergency room.  Call 911  Follow-up with Dr. Alvia next week    ED Prescriptions   None    PDMP not reviewed this encounter.    [1]  Social History Tobacco Use   Smoking status: Former    Current packs/day: 0.00    Types: Cigarettes    Quit date: 02/19/1978    Years since quitting: 46.6   Smokeless tobacco: Never   Vaping Use   Vaping status: Never Used  Substance Use Topics   Alcohol use: No    Alcohol/week: 0.0 standard drinks of alcohol   Drug use: No     Maranda Jamee Jacob, MD 10/11/24 1537  "

## 2024-11-11 ENCOUNTER — Other Ambulatory Visit: Payer: Self-pay | Admitting: Family Medicine

## 2024-11-11 DIAGNOSIS — K219 Gastro-esophageal reflux disease without esophagitis: Secondary | ICD-10-CM

## 2024-11-13 NOTE — Telephone Encounter (Signed)
 Pls contact pt to schedule appt with Dr. Alvia. Fasting labs due. Last seen 09/2023. Thx

## 2024-11-14 NOTE — Telephone Encounter (Signed)
 11/14/2024-Left message on patients voicemail to contact the office to schedule follow-up appointment with fasting labs with Dr. Alvia.

## 2024-11-20 ENCOUNTER — Other Ambulatory Visit

## 2024-11-20 ENCOUNTER — Ambulatory Visit: Admitting: Family Medicine
